# Patient Record
Sex: Female | Born: 1985 | Hispanic: Yes | Marital: Married | Smoking: Never smoker
Health system: Southern US, Community
[De-identification: ages and names within clinical notes are randomized; demographics above are authoritative.]

## PROBLEM LIST (undated history)

## (undated) DIAGNOSIS — Z789 Other specified health status: Secondary | ICD-10-CM

## (undated) DIAGNOSIS — R519 Headache, unspecified: Secondary | ICD-10-CM

## (undated) DIAGNOSIS — E663 Overweight: Secondary | ICD-10-CM

## (undated) DIAGNOSIS — E079 Disorder of thyroid, unspecified: Secondary | ICD-10-CM

## (undated) HISTORY — DX: Overweight: E66.3

## (undated) HISTORY — DX: Disorder of thyroid, unspecified: E07.9

## (undated) HISTORY — PX: NO PAST SURGERIES: SHX2092

---

## 2014-08-27 ENCOUNTER — Ambulatory Visit (INDEPENDENT_AMBULATORY_CARE_PROVIDER_SITE_OTHER): Payer: Self-pay | Admitting: Internal Medicine

## 2014-08-27 ENCOUNTER — Encounter: Payer: Self-pay | Admitting: Internal Medicine

## 2014-08-27 VITALS — BP 108/66 | HR 87 | Temp 98.5°F | Resp 12 | Ht 67.5 in | Wt 154.0 lb

## 2014-08-27 DIAGNOSIS — E0580 Other thyrotoxicosis without thyrotoxic crisis or storm: Secondary | ICD-10-CM | POA: Insufficient documentation

## 2014-08-27 DIAGNOSIS — E0590 Thyrotoxicosis, unspecified without thyrotoxic crisis or storm: Secondary | ICD-10-CM

## 2014-08-27 LAB — CBC
HCT: 36.4 % (ref 36.0–46.0)
Hemoglobin: 11.7 g/dL — ABNORMAL LOW (ref 12.0–15.0)
MCHC: 32.2 g/dL (ref 30.0–36.0)
MCV: 84.9 fl (ref 78.0–100.0)
Platelets: 191 10*3/uL (ref 150.0–400.0)
RBC: 4.29 Mil/uL (ref 3.87–5.11)
RDW: 16 % — ABNORMAL HIGH (ref 11.5–15.5)
WBC: 4.6 10*3/uL (ref 4.0–10.5)

## 2014-08-27 LAB — HEPATIC FUNCTION PANEL
ALT: 19 U/L (ref 0–35)
AST: 23 U/L (ref 0–37)
Albumin: 3.2 g/dL — ABNORMAL LOW (ref 3.5–5.2)
Alkaline Phosphatase: 100 U/L (ref 39–117)
BILIRUBIN TOTAL: 0.2 mg/dL (ref 0.2–1.2)
Bilirubin, Direct: 0 mg/dL (ref 0.0–0.3)
Total Protein: 6.1 g/dL (ref 6.0–8.3)

## 2014-08-27 LAB — TSH: TSH: 0.12 u[IU]/mL — AB (ref 0.35–4.50)

## 2014-08-27 LAB — T4, FREE: Free T4: 0.86 ng/dL (ref 0.60–1.60)

## 2014-08-27 LAB — T3, FREE: T3, Free: 5.1 pg/mL — ABNORMAL HIGH (ref 2.3–4.2)

## 2014-08-27 NOTE — Patient Instructions (Signed)
Please continue the Methimazole 5 mg 2x a day for now.  Please stop the Methimazole (Tapazole) and call us or your primary care doctor if you develop: - sore throat - fever - yellow skin - dark urine - light colored stools As we will then need to check your blood counts and liver tests.  Please stop at the lab.  Please come back for a follow-up appointment in 3 months  Hipertiroidismo (Hyperthyroidism) La tiroides es una glndula grande ubicada en la parte anterior e inferior del cuello. La tiroides interviene [employer] control del metabolismo. El metabolismo es el modo en que el organismo utiliza los alimentos. El control del metabolismo se realiza a travs de una hormona denominada tiroxina. Cuando la tiroides es hiperactiva, produce demasiada hormona. Cuando esto ocurre pueden surgir los siguientes problemas:   Nerviosismo  Intolerancia al calor  Prdida de peso (a pesar del aumento de la ingesta de comida)  Diarrea  Cambios en la textura del cabello o de la piel  Palpitaciones (falta de algunos latidos cardacos o latidos extra)  Taquicardia (frecuencia cardaca acelerada)  Falta de menstruacin (amenorrea)  Temblor en las manos CAUSAS  Enfermedad de Graves (el sistema inmune ataca a la glndula tiroides). sta es la causa ms frecuente.  Inflamacin de la glndula tiroides.  Tumor (generalmente benigno) de la glndula tiroides o Dispensing optician.  Uso excesivo de medicamentos para la tiroides (tanto prescriptos como 'naturales')  Ingesta excesiva de iodo. DIAGNSTICO Para confirmar el hipertiroidismo, el profesional que lo asiste le solicitar anlisis de sangre y estudios con [place]. Algunas veces los signos estn ocultos. Puede ser [employer] profesional controle la enfermedad con exmenes de [place], ya sea antes o despus del diagnstico y [place]. TRATAMIENTO Tratamiento a Estate agent varios tratamientos para Emergency planning/management officer.  Los medicamentos beta bloqueantes podrn proporcionar cierto alivio. Los medicamentos que disminuyen la produccin de hormonas proporcionarn a Emergency planning/management officer temporal Estas medidas en general no ofrecen Environmental education officer. Tratamiento definitivo  Se dispone de varios tratamientos que el profesional que lo asiste podr Teacher, English as a foreign language con usted y que tratarn el problema de [place]. Estos tratamientos varan desde la ciruga (extirpacin de la tiroides) o el uso de yodo radiactivo (que destruye la tiroides por radiacin), hasta el uso de medicamentos antitiroideos (que interfieren en la sntesis de la hormona). Los dos primeros tratamientos son permanentes y Psychologist, clinical. Habitualmente requieren el suministro de hormona de por vida. Esto es debido a que es imposible retirar o destruir la cantidad Engineer, petroleum de tiroides que se necesita para que la persona quede eutiroidea (normal). INSTRUCCIONES PARA EL CUIDADO DOMICILIARIO Consulte con el profesional que lo asiste si el problema por el que lo trata empeora. Algunos ejemplos seran los trastornos ya mencionados. SOLICITE ATENCIN MDICA SI: El trastorno general empeora. EST SEGURO QUE:   Comprende las instrucciones para el alta mdica.  Controlar su enfermedad.  Solicitar atencin mdica de inmediato segn las indicaciones. Document Released: 09/03/2005 Document Revised: 11/26/2011 [facility] Patient Information 2015 [place]. This information is not intended to replace advice given to you by your health care provider. Make sure you discuss any questions you have with your health care provider.

## 2014-08-27 NOTE — Progress Notes (Signed)
Patient ID: Sara Mathis, female   DOB: 07/05/1986, 28 y.o.   MRN: [id number]   HPI  Sara Mathis is a 28 y.o.-year-old female, referred by her PCP, Dr. 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  She does not have insurance.  She was dx with hyperthyroidism in 05/2014, when she presented to PCP for: - + HAs - + excessive sweating/heat intolerance - + weight loss 11 lbs in 3 weeks >> now started to gain it back - + tremors - + anxiety - + palpitations - + fatigue - no hyperdefecation  She was started on MMI 5 mg bid and Lopressor 25 mg 2 mo ago. In the last 2 mo >> all sxs above resolved.  I reviewed pt's thyroid tests: 07/02/2014: TSH <0.008   Reviewed labs per records from PCP: 06/2014: WBC 1.9 (4-10.5), Granulocytes 30% (43-77) 06/2014: AST 40 (0-37)  Pt denies feeling nodules in neck, hoarseness, dysphagia/odynophagia, SOB with lying down.  Pt does not have a FH of thyroid ds. No FH of thyroid cancer. No h/o radiation tx to head or neck.  No seaweed or kelp, no recent contrast studies. No steroid use. No herbal supplements.   ROS: Constitutional: + see HPI Eyes: no blurry vision, no xerophthalmia ENT: no sore throat, no nodules palpated in throat, no dysphagia/odynophagia, no hoarseness Cardiovascular: no CP/SOB/palpitations/leg swelling Respiratory: no cough/SOB Gastrointestinal: no N/V/D/C Musculoskeletal: no muscle/joint aches Skin: no rashes Neurological: no tremors/numbness/tingling/dizziness Psychiatric: no depression/anxiety  No past medical history except per HPI.  No past surgical history.  History   Social History  . Marital Status: Single    Spouse Name: N/A    Number of Children: 0   Occupational History  . cosmetologist   Social History Main Topics  . Smoking status: Never Smoker   . Smokeless tobacco: No  . Alcohol Use: Wine, 1x a mo  . Drug Use: Not on file   Current Outpatient Rx   Name  Route  Sig  Dispense  Refill  . B Complex-Folic Acid (B COMPLEX-VITAMIN B12 PO)   Oral   Take 1 capsule by mouth daily.         . methimazole (TAPAZOLE) 5 MG tablet       5 mg 2x a day     0   . metoprolol tartrate (LOPRESSOR) 25 MG tablet            11    NKDA  No family history other than DM in mother and GM  PE: BP 108/66 mmHg  Pulse 87  Temp(Src) 98.5 F (36.9 C) (Oral)  Resp 12  Ht 5' 7.5" (1.715 m)  Wt 154 lb (69.854 kg)  BMI 23.75 kg/m2  SpO2 99% Wt Readings from Last 3 Encounters:  08/27/14 154 lb (69.854 kg)   Constitutional: normal weight, in NAD Eyes: PERRLA, EOMI, no exophthalmos, no lid lag, no stare ENT: moist mucous membranes, + symmetric thyromegaly, no thyroid bruits, no cervical lymphadenopathy Cardiovascular: RRR, No MRG Respiratory: CTA B Gastrointestinal: abdomen soft, NT, ND, BS+ Musculoskeletal: no deformities, strength intact in all 4 Skin: moist, warm, no rashes Neurological: mild tremor with outstretched hands, DTR normal in all 4  ASSESSMENT: 1. Thyrotoxicosis  PLAN:  1. Patient with a recently found low TSH, with thyrotoxic sxs: weight loss, heat intolerance, palpitations, tremors, anxiety, weight loss. Now on MMI per PCP, with resolution of sxs, but tx is limited by her low WBC count and slightly high  transaminases. - she does not appear to have exogenous causes for the low TSH.  - We discussed that possible causes of thyrotoxicosis are:  Graves ds  (most likely) Thyroiditis toxic multinodular goiter/ toxic adenoma (I cannot feel nodules at palpation of her thyroid). - I suggested that we check the TSH, fT3 and fT4 and also had thyroid stimulating antibodies to screen for Graves' disease.  - I discussed with the pt (through the interpreter) that ideally we would need an uptake and scan to differentiate between the 3 above possible etiologies, however, since pt does not have insurance, we would like to avoid costly tests >>  I will check TSI antibodies and if positive, will assume Graves ds. Pt agrees with this. - we discussed about possible modalities of treatment for the above conditions, to include methimazole use, radioactive iodine ablation or surgery. - The best way to treat her now (since no insurance, would be MMI. I will check WBC and LFTs again >> if abnormal, unfortunately, we will need to go with the RAI tx. If normal, will continue MMI tx since, per clinical signs and sxs, this is working for her -continue beta blocker (Lopressor 25 mg daily) >> may need to stop at next visit - I advised her to join my chart to communicate easier - Return in about 3 months (around 11/26/2014).  Office Visit on 08/27/2014  Component Date Value Ref Range Status  . WBC 08/27/2014 4.6  4.0 - 10.5 K/uL Final  . RBC 08/27/2014 4.29  3.87 - 5.11 Mil/uL Final  . Platelets 08/27/2014 191.0  150.0 - 400.0 K/uL Final  . Hemoglobin 08/27/2014 11.7* 12.0 - 15.0 g/dL Final  . HCT 08/27/2014 36.4  36.0 - 46.0 % Final  . MCV 08/27/2014 84.9  78.0 - 100.0 fl Final  . MCHC 08/27/2014 32.2  30.0 - 36.0 g/dL Final  . RDW 08/27/2014 16.0* 11.5 - 15.5 % Final  . TSH 08/27/2014 0.12* 0.35 - 4.50 uIU/mL Final  . Free T4 08/27/2014 0.86  0.60 - 1.60 ng/dL Final  . T3, Free 08/27/2014 5.1* 2.3 - 4.2 pg/mL Final  . TSI 08/27/2014 174* <140 % baseline Final   Comment: Thyroid stimulating immunoglobulins (TSI) can engage the TSH receptors resulting in hyperthyroidism in Graves' disease patients. TSI levels can be useful in monitoring the clinical outcome of Graves' disease as well as assessing the potential for hyperthyroidism from maternal-fetal transfer. TSI results greater than or equal to (>=) 140% of the Reference Control are considered positive. NOTE: A serum TSH level greater than 350 micro-International Units/mL can interfere with the TSI bioassay and potentially give false positive results. Patients who are pregnant and are  suspected of having hyperthyroidism should have both TSI and human Chorionic Gonadotropin(hCG) tests measured. A serum hCG level greater than 40,625 mIU/mL can interfere with the TSI bioassay and may give false negative results. In these patients it is recommended that a second TSI be obtained when the hCG concentration falls below 40,625 mIU/mL (usually after approximately 20-weeks gestation). The an                          alytical performance characteristics of this assay have been determined by [employer], [place], [place]. The modifications have not been cleared or approved by the FDA. This assay has been validated pursuant to the CLIA regulations and is used for clinical purposes.   . Total Bilirubin 08/27/2014 0.2  0.2 - 1.2  mg/dL Final  . Bilirubin, Direct 08/27/2014 0.0  0.0 - 0.3 mg/dL Final  . Alkaline Phosphatase 08/27/2014 100  39 - 117 U/L Final  . AST 08/27/2014 23  0 - 37 U/L Final  . ALT 08/27/2014 19  0 - 35 U/L Final  . Total Protein 08/27/2014 6.1  6.0 - 8.3 g/dL Final  . Albumin 08/27/2014 3.2* 3.5 - 5.2 g/dL Final   TFTs much improved! WBC and transaminases normal >> can continue MMI 5 mg bid. Will need repeat TFTs in 5 weeks. TSI high >> likely Graves ds.

## 2014-09-01 LAB — THYROID STIMULATING IMMUNOGLOBULIN: TSI: 174 % baseline — ABNORMAL HIGH (ref <140)

## 2014-09-02 ENCOUNTER — Encounter: Payer: Self-pay | Admitting: Internal Medicine

## 2014-09-07 ENCOUNTER — Encounter: Payer: Self-pay | Admitting: *Deleted

## 2014-09-17 DIAGNOSIS — E079 Disorder of thyroid, unspecified: Secondary | ICD-10-CM | POA: Insufficient documentation

## 2014-09-20 ENCOUNTER — Encounter: Payer: Self-pay | Admitting: Internal Medicine

## 2014-11-26 ENCOUNTER — Ambulatory Visit: Payer: Self-pay | Admitting: Internal Medicine

## 2014-12-26 ENCOUNTER — Emergency Department (HOSPITAL_COMMUNITY)
Admission: EM | Admit: 2014-12-26 | Discharge: 2014-12-26 | Disposition: A | Discharge status: Home / Self Care | Payer: Self-pay | Attending: Emergency Medicine | Admitting: Emergency Medicine

## 2014-12-26 ENCOUNTER — Encounter (HOSPITAL_COMMUNITY): Payer: Self-pay | Admitting: Emergency Medicine

## 2014-12-26 ENCOUNTER — Emergency Department (HOSPITAL_COMMUNITY): Admission: EM | Admit: 2014-12-26 | Discharge: 2014-12-26 | Discharge status: Absent without leave | Payer: Self-pay

## 2014-12-26 DIAGNOSIS — Z79899 Other long term (current) drug therapy: Secondary | ICD-10-CM | POA: Insufficient documentation

## 2014-12-26 DIAGNOSIS — Z8639 Personal history of other endocrine, nutritional and metabolic disease: Secondary | ICD-10-CM | POA: Insufficient documentation

## 2014-12-26 DIAGNOSIS — S0083XA Contusion of other part of head, initial encounter: Secondary | ICD-10-CM | POA: Insufficient documentation

## 2014-12-26 DIAGNOSIS — Y9289 Other specified places as the place of occurrence of the external cause: Secondary | ICD-10-CM | POA: Insufficient documentation

## 2014-12-26 DIAGNOSIS — S161XXA Strain of muscle, fascia and tendon at neck level, initial encounter: Secondary | ICD-10-CM | POA: Insufficient documentation

## 2014-12-26 DIAGNOSIS — Y9389 Activity, other specified: Secondary | ICD-10-CM | POA: Insufficient documentation

## 2014-12-26 DIAGNOSIS — T148XXA Other injury of unspecified body region, initial encounter: Secondary | ICD-10-CM

## 2014-12-26 DIAGNOSIS — Y998 Other external cause status: Secondary | ICD-10-CM | POA: Insufficient documentation

## 2014-12-26 MED ORDER — IBUPROFEN 800 MG PO TABS
800.0000 mg | ORAL_TABLET | Freq: Once | ORAL | Status: AC
  Administered 2014-12-26: 800 mg via ORAL
  Filled 2014-12-26: qty 1

## 2014-12-26 MED ORDER — IBUPROFEN 800 MG PO TABS: 800.0000 mg | ORAL_TABLET | Freq: Three times a day (TID) | ORAL | Status: DC
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
## 2014-12-26 NOTE — Discharge Instructions (Signed)
Contusin facial o en el cuero cabelludo ( Facial or Scalp Contusion) Se llama contusin facial o en el cuero cabelludo cuando se recibe un fuerte golpe en la cara o la cabeza. Las lesiones de la cara y la cabeza generalmente causan una gran hinchazn, especialmente alrededor de los ojos. Las contusiones son el resultado de una lesin que causa sangrado debajo de la piel. La zona de la contusin puede ponerse [place], [place] o [place]. Las lesiones menores causarn contusiones sin Social research officer, government, Armed forces training and education officer las ms graves pueden presentar dolor e inflamacin durante un par de semanas.  CAUSAS  La causa de una contusin facial o en el cuero cabelludo suele ser un traumatismo o lesin contundente en la zona del rostro o la cabeza.  SIGNOS Y SNTOMAS   Hinchazn de la zona lesionada.  Cambio de color de la zona lesionada.  Sensibilidad o dolor en la zona lesionada. DIAGNSTICO  Se puede establecer un diagnstico al hacer una historia clnica y un examen fsico. Podra ser necesario tomar una radiografa, tomografa computarizada (TC) o una resonancia magntica (RM) para determinar si hubo lesiones asociadas, como por ejemplo huesos rotos (fracturas). TRATAMIENTO  Frecuentemente, el mejor tratamiento para una contusin facial o del cuero cabelludo es la aplicacin de compresas fras en la zona lesionada. Para calmar el dolor tambin podrn recomendarle medicamentos de venta libre.  [place] solo medicamentos de venta libre o recetados, segn las indicaciones del mdico.  Aplique hielo sobre la zona lesionada.  Ponga el hielo en una bolsa plstica.  Coloque una toalla entre la piel y la bolsa de hielo.  Deje el hielo durante 20 minutos, 2 a 3 veces por da. SOLICITE ATENCIN MDICA SI:  Tiene dificultad para morder.  Siente dolor al [employer].  Est preocupado por las imperfecciones que pueden quedar en la cara. SOLICITE ATENCIN MDICA DE INMEDIATO SI:  Siente  algn dolor intenso o le duele la cabeza y no se calma con los medicamentos.  Observa cambios en la personalidad, somnolencia o confusin que no son habituales.  Vomita.  Tiene una hemorragia nasal persistente.  Tiene visin doble o visin borrosa.  Supura lquido por la nariz o el odo.  Tiene dificultad para caminar o para usar [employer] piernas. ASEGRESE DE QUE:   Comprende estas instrucciones.  Controlar su afeccin.  Recibir ayuda de inmediato si no mejora o si empeora. Document Released: 09/03/2005 Document Revised: 06/24/2013 [facility] Patient Information 2015 [place], [place]. This information is not intended to replace advice given to you by your health care provider. Make sure you discuss any questions you have with your health care provider.
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
## 2014-12-31 ENCOUNTER — Ambulatory Visit: Payer: Self-pay | Admitting: Internal Medicine

## 2015-02-21 DIAGNOSIS — E079 Disorder of thyroid, unspecified: Secondary | ICD-10-CM

## 2015-02-21 HISTORY — DX: Disorder of thyroid, unspecified: E07.9

## 2015-02-21 HISTORY — PX: THYROIDECTOMY: SHX17

## 2015-05-07 DIAGNOSIS — D709 Neutropenia, unspecified: Secondary | ICD-10-CM | POA: Insufficient documentation

## 2015-05-07 DIAGNOSIS — E0590 Thyrotoxicosis, unspecified without thyrotoxic crisis or storm: Secondary | ICD-10-CM

## 2015-06-29 ENCOUNTER — Ambulatory Visit: Payer: Self-pay | Admitting: Internal Medicine

## 2015-11-18 ENCOUNTER — Ambulatory Visit (INDEPENDENT_AMBULATORY_CARE_PROVIDER_SITE_OTHER): Payer: Self-pay | Admitting: Internal Medicine

## 2015-11-18 ENCOUNTER — Encounter: Payer: Self-pay | Admitting: Internal Medicine

## 2015-11-18 VITALS — BP 112/70 | HR 82 | Ht 67.5 in | Wt 166.0 lb

## 2015-11-18 DIAGNOSIS — Z23 Encounter for immunization: Secondary | ICD-10-CM

## 2015-11-18 DIAGNOSIS — J302 Other seasonal allergic rhinitis: Secondary | ICD-10-CM

## 2015-11-18 DIAGNOSIS — E890 Postprocedural hypothyroidism: Secondary | ICD-10-CM

## 2015-11-18 MED ORDER — FEXOFENADINE HCL 180 MG PO TABS: 180.0000 mg | ORAL_TABLET | Freq: Every day | ORAL | Status: DC

## 2015-11-18 NOTE — Progress Notes (Signed)
   Subjective:    Patient ID: Sara Mathis, female    DOB: [birth year], 30 y.o.   MRN: [id number]  HPI   1.  Pain in throat and chest and worried it may have something to do with her thyroid.   Started 1 week ago.  Points to retroauricular and follows along Sternocleidomastoid muscle.  Is a bit sore to touch.  Does not hurt to turn her head.  Throat is sore inside as well.  Feels the need to cough and voice is hoarse.  Throat can be scratchy.  Eyes and nose are itchy and runny at times.  Sneezes now and then. No fever Last week had some nausea and vomiting with diarrhea for one day.  But, admits that happens sometimes anyway. Has not tried otc medication.  2. Hypothyroidism S/P thyroidectomy for Grave's Disease and hyperthyroidism:  Misses her Levothyroxine 1 day out of a week.  Missed her follow up to recheck TSH.    Review of Systems     Objective:   Physical Exam NAD Mildly hoarse HEENT:  PERRL, EOMI, conjunctivae without injection, TMs pearly gray, No posterior or tonsillar erythema Tender over maxillary sinuses. Neck:  Supple, No adenopathy.  Tender along SCM muscles bilaterally-mild. No thyroid area masses or swelling Chest:  CTA CV:  RRR without murmur or rub, radial pulses normal and equal.   Neuro: No tremor        Assessment & Plan:  1.  Seasonal allergies vs.  Viral URI.  Fexofenadine 180 mg daily.  Push fluids.  2.  Surgical Hypothyroidism:  TSH.

## 2015-11-19 ENCOUNTER — Encounter: Payer: Self-pay | Admitting: Internal Medicine

## 2015-11-19 DIAGNOSIS — E890 Postprocedural hypothyroidism: Secondary | ICD-10-CM | POA: Insufficient documentation

## 2015-11-19 LAB — TSH: TSH: 31.34 u[IU]/mL — ABNORMAL HIGH (ref 0.450–4.500)

## 2015-11-20 MED ORDER — LEVOTHYROXINE SODIUM 125 MCG PO TABS
125.0000 ug | ORAL_TABLET | Freq: Every day | ORAL | Status: DC
Start: 2015-11-20 — End: 2016-12-13

## 2015-11-20 NOTE — Addendum Note (Signed)
Addended by: Marcelino Duster on: 11/20/2015 08:38 AM   Modules accepted: Orders, Medications

## 2016-01-06 ENCOUNTER — Other Ambulatory Visit: Payer: Self-pay

## 2016-01-13 ENCOUNTER — Ambulatory Visit: Payer: Self-pay | Admitting: Internal Medicine

## 2016-01-13 ENCOUNTER — Other Ambulatory Visit: Payer: Self-pay

## 2016-01-14 LAB — TSH: TSH: 0.413 u[IU]/mL — ABNORMAL LOW (ref 0.450–4.500)

## 2016-01-20 ENCOUNTER — Ambulatory Visit: Payer: Self-pay | Admitting: Internal Medicine

## 2016-02-17 ENCOUNTER — Ambulatory Visit: Payer: Self-pay | Admitting: Internal Medicine

## 2016-02-29 ENCOUNTER — Telehealth: Payer: Self-pay | Admitting: Internal Medicine

## 2016-02-29 NOTE — Telephone Encounter (Signed)
Patient called to see if Dr. Amil Amen can write a detailed  letter stating patient's  medical problems, medications, and how long patient has been established with [facility].

## 2016-03-01 NOTE — Telephone Encounter (Signed)
Please need same info regarding what this is about so I can give info that makes sense.

## 2016-03-01 NOTE — Telephone Encounter (Signed)
Left voice message to return call 

## 2016-03-01 NOTE — Telephone Encounter (Signed)
No answer from patient. Left message to call the office. Need to know why this letter is needed. Thanks

## 2016-03-02 NOTE — Telephone Encounter (Signed)
Lawyer name is Birdena Crandall Address: [address], [place], [place], [place] [address] Telephone: [phone] Email: [email or web address]  Patient is trying to get [employer] and states the letter needs to have details about her health problems, surgeries, and medications.

## 2016-03-05 ENCOUNTER — Encounter: Payer: Self-pay | Admitting: Internal Medicine

## 2016-03-05 NOTE — Telephone Encounter (Signed)
Please send correspondence to her attorney and notify patient.

## 2016-03-07 NOTE — Telephone Encounter (Signed)
Letter mailed.  Patient informed

## 2016-04-20 ENCOUNTER — Other Ambulatory Visit (INDEPENDENT_AMBULATORY_CARE_PROVIDER_SITE_OTHER): Payer: Self-pay

## 2016-04-20 DIAGNOSIS — E890 Postprocedural hypothyroidism: Secondary | ICD-10-CM

## 2016-04-21 LAB — TSH: TSH: 3.77 u[IU]/mL (ref 0.450–4.500)

## 2016-04-21 NOTE — Progress Notes (Signed)
Notify her TSH was ok.  Stay on the current dose of Levothyroxine, which I have as 125 mcg daily

## 2016-12-13 ENCOUNTER — Telehealth: Payer: Self-pay

## 2016-12-13 DIAGNOSIS — E890 Postprocedural hypothyroidism: Secondary | ICD-10-CM

## 2016-12-13 MED ORDER — LEVOTHYROXINE SODIUM 125 MCG PO TABS: 125.0000 ug | ORAL_TABLET | Freq: Every day | ORAL | 1 refills | Status: DC

## 2016-12-13 NOTE — Telephone Encounter (Signed)
To estefania to notify patient. Rx has been sent to the pharmacy.

## 2016-12-13 NOTE — Telephone Encounter (Signed)
Patient called requesting a refill on Levothyroxine. Patient has not been seen since 11/18/15. To Dr. Amil Amen for further direction.

## 2016-12-13 NOTE — Telephone Encounter (Signed)
Per Dr. Amil Amen she will fill for 2 month but patient needs to schedule appointment. To [name] to notify patient.

## 2016-12-18 NOTE — Telephone Encounter (Signed)
Patient informed about Rx. Pt. Currently working in [place] will call to set up follow up appointment once she returns

## 2017-03-05 ENCOUNTER — Other Ambulatory Visit: Payer: Self-pay | Admitting: Internal Medicine

## 2017-03-05 DIAGNOSIS — E890 Postprocedural hypothyroidism: Secondary | ICD-10-CM

## 2017-06-10 ENCOUNTER — Encounter: Payer: Self-pay | Admitting: Internal Medicine

## 2017-06-10 ENCOUNTER — Ambulatory Visit (INDEPENDENT_AMBULATORY_CARE_PROVIDER_SITE_OTHER): Payer: Self-pay | Admitting: Internal Medicine

## 2017-06-10 VITALS — BP 130/88 | HR 70 | Resp 12 | Ht 67.5 in | Wt 176.0 lb

## 2017-06-10 DIAGNOSIS — Z23 Encounter for immunization: Secondary | ICD-10-CM

## 2017-06-10 DIAGNOSIS — E890 Postprocedural hypothyroidism: Secondary | ICD-10-CM

## 2017-06-10 DIAGNOSIS — R252 Cramp and spasm: Secondary | ICD-10-CM

## 2017-06-10 DIAGNOSIS — E663 Overweight: Secondary | ICD-10-CM

## 2017-06-10 HISTORY — DX: Overweight: E66.3

## 2017-06-10 MED ORDER — LEVOTHYROXINE SODIUM 125 MCG PO TABS: 125.0000 ug | ORAL_TABLET | Freq: Every day | ORAL | 3 refills | Status: DC

## 2017-06-10 NOTE — Patient Instructions (Signed)

## 2017-06-10 NOTE — Progress Notes (Signed)
   Subjective:    Patient ID: Sara Mathis, female    DOB: Oct 17, 1985, 31 y.o.   MRN: [id number]  HPI   1.  Overweight:  Discussed has gradually gained weight since 2015 and is now in overweight range.  2.  Surgical hypothyroidism:  History of hyperthyroidism.  States she does not take Levothyroxine when she has period (3 days monthly).  3.  Calf cramps at night:  Not drinking lots of water.  Not taking Vitamin B complex.  Has not tried bar of soap at foot of bed.   [place]   Current Meds  Medication Sig  . levothyroxine (SYNTHROID, LEVOTHROID) 125 MCG tablet Take 1 tablet (125 mcg total) by mouth daily before breakfast.    No Known Allergies    Review of Systems     Objective:   Physical Exam   NAD HEENT:  PERRL, EOMI,  Neck:  Supple, No adenopathy, no thyroid area mass Lungs:  CTA CV:  RRR without murmur or rub, radial and DP pulses normal and equal ABd:  S, NT, No HSM or mass, + BS LE:  No edema, no erythema, NT         Assessment & Plan:  1.  Overweight:  Long discussion on entire family working on lifestyle changes together.  Dietary and physical activity information discussed.  2.  Surgical Hypothyroidism:  TSH.  Continue Levothyroxine 125 mcg daily.  No missing--to take 1/2 hour before breakfast daily.  3.  Nocturnal leg cramps:  Increase water intake.  Increase fruits and veggies.  B complex 1 tab 3 times daily.  4.  HM:  Needs Tdap.  CPE with pap in 4-6 months.  Reportedly has never had a pap.

## 2017-06-11 LAB — BASIC METABOLIC PANEL
BUN / CREAT RATIO: 26 — AB (ref 9–23)
BUN: 18 mg/dL (ref 6–20)
CALCIUM: 9 mg/dL (ref 8.7–10.2)
CO2: 23 mmol/L (ref 20–29)
Chloride: 103 mmol/L (ref 96–106)
Creatinine, Ser: 0.68 mg/dL (ref 0.57–1.00)
GFR, EST AFRICAN AMERICAN: 134 mL/min/{1.73_m2} (ref >59)
GFR, EST NON AFRICAN AMERICAN: 116 mL/min/{1.73_m2} (ref >59)
Glucose: 70 mg/dL (ref 65–99)
POTASSIUM: 4.3 mmol/L (ref 3.5–5.2)
SODIUM: 139 mmol/L (ref 134–144)

## 2017-06-11 LAB — TSH: TSH: 3.17 u[IU]/mL (ref 0.450–4.500)

## 2017-10-11 ENCOUNTER — Telehealth: Payer: Self-pay | Admitting: Internal Medicine

## 2017-10-11 NOTE — Telephone Encounter (Signed)
Patient is requesting refill for levothyroxine (SYNTHROID, LEVOTHROID) 125 MCG tablet  To be sent to preferred pharmacy in [place]. Please notified patient once is sent Pt. Had CPE on 10/14/17 but rescheduled for 11/06/17 at 11AM

## 2017-10-14 ENCOUNTER — Ambulatory Visit: Payer: Self-pay | Admitting: Internal Medicine

## 2017-10-14 ENCOUNTER — Other Ambulatory Visit: Payer: Self-pay

## 2017-10-14 DIAGNOSIS — E890 Postprocedural hypothyroidism: Secondary | ICD-10-CM

## 2017-10-14 MED ORDER — LEVOTHYROXINE SODIUM 125 MCG PO TABS
125.0000 ug | ORAL_TABLET | Freq: Every day | ORAL | 1 refills | Status: DC
Start: 2017-10-14 — End: 2017-11-06

## 2017-10-14 NOTE — Telephone Encounter (Signed)
Rx sent to pharmacy   

## 2017-11-06 ENCOUNTER — Encounter: Payer: Self-pay | Admitting: Internal Medicine

## 2017-11-06 ENCOUNTER — Ambulatory Visit: Payer: Self-pay | Admitting: Internal Medicine

## 2017-11-06 VITALS — BP 120/70 | HR 84 | Resp 12 | Ht 67.5 in | Wt 180.0 lb

## 2017-11-06 DIAGNOSIS — E890 Postprocedural hypothyroidism: Secondary | ICD-10-CM

## 2017-11-06 DIAGNOSIS — E663 Overweight: Secondary | ICD-10-CM

## 2017-11-06 DIAGNOSIS — Z124 Encounter for screening for malignant neoplasm of cervix: Secondary | ICD-10-CM

## 2017-11-06 DIAGNOSIS — E079 Disorder of thyroid, unspecified: Secondary | ICD-10-CM

## 2017-11-06 DIAGNOSIS — Z0000 Encounter for general adult medical examination without abnormal findings: Secondary | ICD-10-CM

## 2017-11-06 LAB — POCT WET PREP WITH KOH
KOH PREP POC: NEGATIVE
RBC Wet Prep HPF POC: NEGATIVE
Trichomonas, UA: NEGATIVE
YEAST WET PREP PER HPF POC: NEGATIVE

## 2017-11-06 MED ORDER — LEVOTHYROXINE SODIUM 125 MCG PO TABS: 125.0000 ug | ORAL_TABLET | Freq: Every day | ORAL | 11 refills | Status: DC

## 2017-11-06 NOTE — Progress Notes (Signed)
Subjective:    Patient ID: Sara Mathis, female    DOB: 07-14-1986, 32 y.o.   MRN: [id number]  HPI   CPE with pap  1.  Pap:  Never.  No family history of cervical cancer.  2.  Mammogram:  Never.  No family history of breast cancer.  3.  Osteoprevention:  Little in way dairy.  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  No family history of osteoporosis or fractures.  4.  Guaiac Cards: Never.  No family history of colon cancer.  5.  Colonoscopy:  Never.  6.  Immunizations:  No influenza vaccine this fall. Immunization History  Administered Date(s) Administered  . Influenza,inj,Quad PF,6+ Mos 11/18/2015  . Tdap 06/10/2017    7.  Glucose/Cholesterol:  Blood glucose has always been normal.  Has never had cholesterol checked.   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  Later, states sometimes tired. Has gained a bit of weight.  Dry skin.  + constipation.  Medications Discontinued During This Encounter  Medication Reason  . levothyroxine (SYNTHROID, LEVOTHROID) 125 MCG tablet Reorder   No Known Allergies   Past Medical History:  Diagnosis Date  . Overweight (BMI 25.0-29.9) 06/10/2017  . Thyroid disease 02/21/2015   Grave's disease resulting in total thyroidectomy    Past Surgical History:  Procedure Laterality Date  . THYROIDECTOMY Bilateral 02/21/2015   For Grave's Disease not well controlled on medication    Family History  Problem Relation Age of Onset  . Diabetes Mother   . Fibromyalgia Mother   . Heart disease Mother        age 33 and 81  . Diabetes Father   . ADD / ADHD Brother        Review of Systems  HENT: Positive for dental problem (does not have orange card). Negative for hearing loss.   Respiratory: Negative for cough and shortness of breath.   Cardiovascular: Negative for chest pain.  Gastrointestinal: Negative for abdominal pain.       Objective:   Physical  Exam  Constitutional: She is oriented to person, place, and time. She appears well-developed and well-nourished.  HENT:  Head: Normocephalic and atraumatic.  Right Ear: Hearing, tympanic membrane, external ear and ear canal normal.  Left Ear: Hearing, tympanic membrane, external ear and ear canal normal.  Nose: Nose normal.  Mouth/Throat: Oropharynx is clear and moist and mucous membranes are normal.  Points to upper right molar as source of pain.  Unable to see any abnormality, though posterior tooth hard to see.  Eyes: Conjunctivae and EOM are normal. Pupils are equal, round, and reactive to light.  Discs sharp bilaterally  Neck: Normal range of motion and full passive range of motion without pain. Neck supple. No thyroid mass and no thyromegaly present.  Cardiovascular: Normal rate, regular rhythm, S1 normal and S2 normal. Exam reveals no S3, no S4 and no friction rub.  No murmur heard. Carotid, radial, femoral, DP and PT pulses normal and equal  Pulmonary/Chest: Effort normal and breath sounds normal. Right breast exhibits no inverted nipple, no mass, no nipple discharge, no skin change and no tenderness. Left breast exhibits no inverted nipple, no mass, no nipple discharge, no skin change and no tenderness.  Abdominal: Soft. Bowel sounds are normal. She exhibits no mass. There is no hepatosplenomegaly. There is no tenderness. No hernia.  Genitourinary:  Genitourinary Comments: Normal external genitalia.  Scant mucousy cervical discharge.  No  cervical lesion.  No uterine or adnexal mass or tenderness.  Musculoskeletal: Normal range of motion.  Lymphadenopathy:       Head (right side): No submental and no submandibular adenopathy present.       Head (left side): No submental and no submandibular adenopathy present.    She has no cervical adenopathy.    She has no axillary adenopathy.       Right: No inguinal and no supraclavicular adenopathy present.       Left: No inguinal and no  supraclavicular adenopathy present.  Neurological: She is alert and oriented to person, place, and time. She has normal strength and normal reflexes. No cranial nerve deficit or sensory deficit. Coordination normal.  Skin: Skin is warm and dry.  Psychiatric: She has a normal mood and affect. Her behavior is normal. Judgment and thought content normal.    Wet prep:  + clue cells, but negative whiff, trich.  No fungal elements.      Assessment & Plan:  1.  CPE with pap CBC, CMP, TSH, FLP Encouraged yearly influenza vaccine. Encouraged Calcium 500 mg with 200 IU Vitamin D twice daily and weight bearing physical activity. Patient later admitted to eating lots of cheese daily, so may have adequate calcium and Vitamin D intake.  2.  Overweight:  Discussed health lifestyle, eating habits and increasing daily physical activity outside of work.  3.  Hypothyroidism:  TSH as above.  Continue Levothyroxine.

## 2017-11-06 NOTE — Patient Instructions (Signed)

## 2017-11-07 LAB — COMPREHENSIVE METABOLIC PANEL
A/G RATIO: 1.4 (ref 1.2–2.2)
ALK PHOS: 74 IU/L (ref 39–117)
ALT: 18 IU/L (ref 0–32)
AST: 21 IU/L (ref 0–40)
Albumin: 4.1 g/dL (ref 3.5–5.5)
BILIRUBIN TOTAL: 0.4 mg/dL (ref 0.0–1.2)
BUN/Creatinine Ratio: 14 (ref 9–23)
BUN: 10 mg/dL (ref 6–20)
CHLORIDE: 103 mmol/L (ref 96–106)
CO2: 23 mmol/L (ref 20–29)
Calcium: 9 mg/dL (ref 8.7–10.2)
Creatinine, Ser: 0.71 mg/dL (ref 0.57–1.00)
GFR calc Af Amer: 129 mL/min/{1.73_m2} (ref >59)
GFR calc non Af Amer: 112 mL/min/{1.73_m2} (ref >59)
Globulin, Total: 3 g/dL (ref 1.5–4.5)
Glucose: 73 mg/dL (ref 65–99)
POTASSIUM: 3.9 mmol/L (ref 3.5–5.2)
Sodium: 140 mmol/L (ref 134–144)
Total Protein: 7.1 g/dL (ref 6.0–8.5)

## 2017-11-07 LAB — LIPID PANEL W/O CHOL/HDL RATIO
Cholesterol, Total: 191 mg/dL (ref 100–199)
HDL: 61 mg/dL (ref >39)
LDL Calculated: 111 mg/dL — ABNORMAL HIGH (ref 0–99)
TRIGLYCERIDES: 95 mg/dL (ref 0–149)
VLDL Cholesterol Cal: 19 mg/dL (ref 5–40)

## 2017-11-07 LAB — CBC WITH DIFFERENTIAL/PLATELET
BASOS: 0 %
Basophils Absolute: 0 10*3/uL (ref 0.0–0.2)
EOS (ABSOLUTE): 0 10*3/uL (ref 0.0–0.4)
Eos: 1 %
Hematocrit: 40.8 % (ref 34.0–46.6)
Hemoglobin: 12.6 g/dL (ref 11.1–15.9)
IMMATURE GRANULOCYTES: 0 %
Immature Grans (Abs): 0 10*3/uL (ref 0.0–0.1)
Lymphocytes Absolute: 1.5 10*3/uL (ref 0.7–3.1)
Lymphs: 30 %
MCH: 29.2 pg (ref 26.6–33.0)
MCHC: 30.9 g/dL — ABNORMAL LOW (ref 31.5–35.7)
MCV: 95 fL (ref 79–97)
Monocytes Absolute: 0.4 10*3/uL (ref 0.1–0.9)
Monocytes: 9 %
NEUTROS PCT: 60 %
Neutrophils Absolute: 2.9 10*3/uL (ref 1.4–7.0)
PLATELETS: 229 10*3/uL (ref 150–379)
RBC: 4.31 x10E6/uL (ref 3.77–5.28)
RDW: 13.2 % (ref 12.3–15.4)
WBC: 4.8 10*3/uL (ref 3.4–10.8)

## 2017-11-07 LAB — TSH: TSH: 5.07 u[IU]/mL — ABNORMAL HIGH (ref 0.450–4.500)

## 2017-11-08 LAB — CYTOLOGY - PAP

## 2017-12-10 ENCOUNTER — Other Ambulatory Visit: Payer: Self-pay

## 2017-12-10 ENCOUNTER — Telehealth: Payer: Self-pay | Admitting: Internal Medicine

## 2017-12-10 DIAGNOSIS — E890 Postprocedural hypothyroidism: Secondary | ICD-10-CM

## 2017-12-10 MED ORDER — LEVOTHYROXINE SODIUM 125 MCG PO TABS: 125.0000 ug | ORAL_TABLET | Freq: Every day | ORAL | 11 refills | Status: DC

## 2017-12-10 NOTE — Telephone Encounter (Signed)
Rx sent to pharmacy   

## 2017-12-10 NOTE — Telephone Encounter (Signed)
Patient would like Rx transferred from [facility] to [facility] on [address] [place], [place]  [address].  [phone].  Rx to transfer levothyroxine (SYNTHROID,LEVOTHROId) 125 mcg tablet.  Patient contact [phone]

## 2018-01-06 ENCOUNTER — Other Ambulatory Visit: Payer: Self-pay

## 2018-03-28 ENCOUNTER — Ambulatory Visit: Payer: Self-pay | Admitting: Internal Medicine

## 2018-03-31 ENCOUNTER — Encounter: Payer: Self-pay | Admitting: Internal Medicine

## 2018-03-31 ENCOUNTER — Ambulatory Visit: Payer: Self-pay | Admitting: Internal Medicine

## 2018-03-31 VITALS — BP 102/70 | HR 80 | Resp 12 | Ht 67.5 in | Wt 181.0 lb

## 2018-03-31 DIAGNOSIS — R5383 Other fatigue: Secondary | ICD-10-CM

## 2018-03-31 DIAGNOSIS — E890 Postprocedural hypothyroidism: Secondary | ICD-10-CM

## 2018-03-31 NOTE — Progress Notes (Signed)
   Subjective:    Patient ID: Sara Mathis, female    DOB: September 01, 1986, 32 y.o.   MRN: [id number]  HPI   Fatigued, nervous.  Finds herself crying more.  3 weeks ago, felt palpitations.  Feels tremulous,  Has missed her levothyroxine maybe 5 days out of the month.  She was working different shifts and forgetting to take on some days. Felt she had to come home and just rest all the time. Skin is dry, hair dry.  No definite constipations. She is no longer working that job. She is not certain if the Levothyroxine she is taking is from a new manufacturer or not.   She waits 30-60 minutes after taking her levothyroxine daily to eat. She has felt this way before when missing her hormone   No suicidal ideation  Current Meds  Medication Sig  . levothyroxine (SYNTHROID, LEVOTHROID) 125 MCG tablet Take 1 tablet (125 mcg total) by mouth daily before breakfast.    No Known Allergies       Review of Systems     Objective:   Physical Exam NAD HEENT:  Possibly a bit of lid lag on the left.  PERRL, EOMI, No definite exophthalmos.   Neck:  Supple, No adenopathy, no palpable thyroid Chest:  CTA CV:  RRR without murmur or rub,  Radial and DP pulses normal and equal Abd:  S, NT, No HSM or mass, + BS LE:  No edema. Ankle reflexes 2+/4 No tremor with hands       Assessment & Plan:  1.  Fatigue:  Patient and wife concerned about depression, but also recognize could be from missing levothyroxine. CBC, CMP, TSH, free T4.  If no abnormal lab findings, will have her come in to readdress as depression causing symptoms.

## 2018-04-01 LAB — CBC WITH DIFFERENTIAL/PLATELET
BASOS ABS: 0 10*3/uL (ref 0.0–0.2)
Basos: 0 %
EOS (ABSOLUTE): 0.1 10*3/uL (ref 0.0–0.4)
Eos: 1 %
HEMOGLOBIN: 11.6 g/dL (ref 11.1–15.9)
Hematocrit: 37.9 % (ref 34.0–46.6)
IMMATURE GRANS (ABS): 0 10*3/uL (ref 0.0–0.1)
Immature Granulocytes: 0 %
LYMPHS: 41 %
Lymphocytes Absolute: 1.6 10*3/uL (ref 0.7–3.1)
MCH: 28.1 pg (ref 26.6–33.0)
MCHC: 30.6 g/dL — ABNORMAL LOW (ref 31.5–35.7)
MCV: 92 fL (ref 79–97)
MONOCYTES: 7 %
Monocytes Absolute: 0.3 10*3/uL (ref 0.1–0.9)
NEUTROS ABS: 1.9 10*3/uL (ref 1.4–7.0)
Neutrophils: 51 %
PLATELETS: 213 10*3/uL (ref 150–450)
RBC: 4.13 x10E6/uL (ref 3.77–5.28)
RDW: 14 % (ref 12.3–15.4)
WBC: 3.8 10*3/uL (ref 3.4–10.8)

## 2018-04-01 LAB — COMPREHENSIVE METABOLIC PANEL
ALBUMIN: 3.6 g/dL (ref 3.5–5.5)
ALT: 11 IU/L (ref 0–32)
AST: 15 IU/L (ref 0–40)
Albumin/Globulin Ratio: 1.4 (ref 1.2–2.2)
Alkaline Phosphatase: 67 IU/L (ref 39–117)
BUN / CREAT RATIO: 16 (ref 9–23)
BUN: 14 mg/dL (ref 6–20)
Bilirubin Total: 0.2 mg/dL (ref 0.0–1.2)
CALCIUM: 8.6 mg/dL — AB (ref 8.7–10.2)
CHLORIDE: 106 mmol/L (ref 96–106)
CO2: 25 mmol/L (ref 20–29)
Creatinine, Ser: 0.9 mg/dL (ref 0.57–1.00)
GFR, EST AFRICAN AMERICAN: 97 mL/min/{1.73_m2} (ref >59)
GFR, EST NON AFRICAN AMERICAN: 84 mL/min/{1.73_m2} (ref >59)
GLUCOSE: 81 mg/dL (ref 65–99)
Globulin, Total: 2.6 g/dL (ref 1.5–4.5)
Potassium: 4.1 mmol/L (ref 3.5–5.2)
Sodium: 140 mmol/L (ref 134–144)
TOTAL PROTEIN: 6.2 g/dL (ref 6.0–8.5)

## 2018-04-01 LAB — T4, FREE: Free T4: 1.61 ng/dL (ref 0.82–1.77)

## 2018-04-01 LAB — TSH: TSH: 2.29 u[IU]/mL (ref 0.450–4.500)

## 2018-05-13 ENCOUNTER — Ambulatory Visit: Payer: Self-pay | Admitting: Internal Medicine

## 2018-05-22 ENCOUNTER — Ambulatory Visit: Payer: Self-pay | Admitting: Internal Medicine

## 2018-07-21 ENCOUNTER — Ambulatory Visit: Payer: Self-pay | Admitting: Internal Medicine

## 2018-07-23 ENCOUNTER — Ambulatory Visit: Payer: Self-pay | Admitting: Internal Medicine

## 2018-07-23 ENCOUNTER — Encounter: Payer: Self-pay | Admitting: Internal Medicine

## 2018-07-23 VITALS — BP 122/80 | HR 76 | Resp 12 | Ht 67.5 in | Wt 185.0 lb

## 2018-07-23 DIAGNOSIS — F419 Anxiety disorder, unspecified: Secondary | ICD-10-CM | POA: Insufficient documentation

## 2018-07-23 DIAGNOSIS — L709 Acne, unspecified: Secondary | ICD-10-CM

## 2018-07-23 DIAGNOSIS — F329 Major depressive disorder, single episode, unspecified: Secondary | ICD-10-CM

## 2018-07-23 DIAGNOSIS — F32A Depression, unspecified: Secondary | ICD-10-CM | POA: Insufficient documentation

## 2018-07-23 MED ORDER — NORGESTIMATE-ETH ESTRADIOL 0.25-35 MG-MCG PO TABS: ORAL_TABLET | ORAL | 4 refills | Status: DC

## 2018-07-23 MED ORDER — BUPROPION HCL ER (SR) 150 MG PO TB12: ORAL_TABLET | ORAL | 2 refills | Status: DC

## 2018-07-23 NOTE — Progress Notes (Signed)
   Subjective:    Patient ID: Sara Mathis, female    DOB: 1986-09-10, 32 y.o.   MRN: [id number]  HPI  1.  Fatigue, nervousness--last visit for which she has not followed up.  CBC, CMP and TSH were all okay.    Depression screen PHQ 2/9 07/23/2018  Decreased Interest 2  Down, Depressed, Hopeless 2  PHQ - 2 Score 4  Altered sleeping 3  Tired, decreased energy 3  Change in appetite 3  Feeling bad or failure about yourself  1  Trouble concentrating 3  Moving slowly or fidgety/restless 0  Suicidal thoughts 0  PHQ-9 Score 17  Difficult doing work/chores Very difficult    GAD 7 : Generalized Anxiety Score 07/23/2018  Nervous, Anxious, on Edge 0  Control/stop worrying 1  Worry too much - different things 1  Trouble relaxing 0  Restless 1  Easily annoyed or irritable 3  Afraid - awful might happen 1  Total GAD 7 Score 7  Anxiety Difficulty Somewhat difficult   Her partner is already going to [employer].  She would need to see in [facility] if there is another Spanish Scientist, product/process development. No history of seizure.  2.  Acne:  She would like to talk about BCPs to see if would help with acne.  Has been on BCPs previously with improved control of acne.  Took Diane 35 in past, which is not available in [place].  LMP:  07/09/18.   Nonsmoker No history of DVTS/blood clotting abnormalities  Current Meds  Medication Sig  . amoxicillin (AMOXIL) 500 MG capsule TK 1 C PO Q 8 H UNTIL GONE  . ibuprofen (ADVIL,MOTRIN) 800 MG tablet Take 1 tablet (800 mg total) by mouth 3 (three) times daily.  Marland Kitchen levothyroxine (SYNTHROID, LEVOTHROID) 125 MCG tablet Take 1 tablet (125 mcg total) by mouth daily before breakfast.  . methylPREDNISolone (MEDROL DOSEPAK) 4 MG TBPK tablet Take 4 mg by mouth. As directed    No Known Allergies   Review of Systems     Objective:   Physical Exam NAD Left eye with a bit of proptosis and lid lag.  States this unchanged in past  year.  Swelling of left cheek with dental issues for which she is being treated. Lungs:  CTA CV:  RRR without murmur or rub.  Radial pulses normal and equal. Skin:  Acneiform lesions on hairline of face and entire back.  Mainly pustules.       Assessment & Plan:  1.  Depression/mild anxiety:  Bupropion ER 150 mg once daily for 3 days, then 1 tab by mouth twice daily thereafter. Follow up in 1 week. To call if significant increase in anxiety.  2.  Acne:  Sprintec 28 day.  Hold on antibiotics at this point.

## 2018-07-30 ENCOUNTER — Ambulatory Visit: Payer: Self-pay | Admitting: Internal Medicine

## 2018-07-30 ENCOUNTER — Encounter: Payer: Self-pay | Admitting: Internal Medicine

## 2018-07-30 VITALS — BP 112/88 | HR 70 | Resp 12 | Ht 67.5 in | Wt 184.0 lb

## 2018-07-30 DIAGNOSIS — F32A Depression, unspecified: Secondary | ICD-10-CM

## 2018-07-30 DIAGNOSIS — F329 Major depressive disorder, single episode, unspecified: Secondary | ICD-10-CM

## 2018-07-30 NOTE — Progress Notes (Signed)
   Subjective:    Patient ID: Sara Mathis, female    DOB: 1986/09/05, 32 y.o.   MRN: [id number]  HPI   1.  Depression:  Started Bupropion SR 150 mg once daily on the 7th, but had nausea and so moved the dose to the evening time and felt better.  She took her thyroid hormone in the morning the first day she took bupropion, then took the bupropion 1/2 hour later and then ate.   She feels a little tired with the medication.   No suicidal ideation Is having bad dreams.  Not every night.  Other nights, she is sleeping well.    Current Meds  Medication Sig  . buPROPion (WELLBUTRIN SR) 150 MG 12 hr tablet 1 tab by mouth in the morning for 3 days then 1 tab by mouth twice daily thereafter  . levothyroxine (SYNTHROID, LEVOTHROID) 125 MCG tablet Take 1 tablet (125 mcg total) by mouth daily before breakfast.    No Known Allergies     Review of Systems     Objective:   Physical Exam NAD Smiling.       Assessment & Plan:  Depression:  She will increase to twice daily dosing of the bupropion when she feels her nightmares are better.  Encouraged her to take at end of evening meal instead of at bedtime. To take the morning dose when she starts after breakfast, not before. To call if problems with the medication. Follow up in 6 weeks.

## 2018-08-05 ENCOUNTER — Ambulatory Visit: Payer: Self-pay | Admitting: Internal Medicine

## 2018-09-26 ENCOUNTER — Ambulatory Visit: Payer: Self-pay | Admitting: Internal Medicine

## 2018-10-03 ENCOUNTER — Ambulatory Visit: Payer: Self-pay | Admitting: Internal Medicine

## 2018-12-27 ENCOUNTER — Other Ambulatory Visit: Payer: Self-pay | Admitting: Internal Medicine

## 2018-12-27 DIAGNOSIS — E890 Postprocedural hypothyroidism: Secondary | ICD-10-CM

## 2019-04-22 ENCOUNTER — Ambulatory Visit: Payer: Self-pay | Admitting: Internal Medicine

## 2019-05-15 ENCOUNTER — Encounter: Payer: Self-pay | Admitting: Internal Medicine

## 2019-05-15 ENCOUNTER — Ambulatory Visit: Payer: Self-pay | Admitting: Internal Medicine

## 2019-05-15 ENCOUNTER — Other Ambulatory Visit: Payer: Self-pay

## 2019-05-15 VITALS — BP 122/80 | HR 78 | Resp 12 | Ht 67.5 in | Wt 202.0 lb

## 2019-05-15 DIAGNOSIS — K0889 Other specified disorders of teeth and supporting structures: Secondary | ICD-10-CM

## 2019-05-15 DIAGNOSIS — E890 Postprocedural hypothyroidism: Secondary | ICD-10-CM

## 2019-05-15 DIAGNOSIS — J3089 Other allergic rhinitis: Secondary | ICD-10-CM

## 2019-05-15 MED ORDER — LEVOTHYROXINE SODIUM 125 MCG PO TABS: 125.0000 ug | ORAL_TABLET | Freq: Every day | ORAL | 2 refills | Status: DC

## 2019-05-15 MED ORDER — CETIRIZINE HCL 10 MG PO TABS: 10.0000 mg | ORAL_TABLET | Freq: Every day | ORAL | 6 refills | Status: DC

## 2019-05-15 NOTE — Progress Notes (Signed)
    Subjective:    Patient ID: Sara Mathis, female   DOB: 1986-03-03, 33 y.o.   MRN: [id number]   HPI   1.  Postoperative hypothyroidism (previous hyperthyroidism):  She missed most of July with her hormone replacement and several days in August.  States she missed during her period as she just didn't feel well then.   Discussed her periods are affected by being low on thyroid hormone.  2.  Right ear pain:  For 3 days.  Has had water in her ear with taking a shower recently.   The pain comes and goes.  Manipulating the pinnae does not make the pain worse.   No fever.   Has had throat pain in the right on and off as well.   No cough.   Has taken ibuprofen 1-2 tabs with minimal effect.    Current Meds  Medication Sig  . levothyroxine (SYNTHROID, LEVOTHROID) 125 MCG tablet TAKE 1 TABLET BY MOUTH DAILY BEFORE MEALS   No Known Allergies   Review of Systems    Objective:   BP 122/80 (BP Location: Left Arm, Patient Position: Sitting, Cuff Size: Normal)   Pulse 78   Resp 12   Ht 5' 7.5" (1.715 m)   Wt 202 lb (91.6 kg)   LMP 04/27/2019   BMI 31.17 kg/m   Physical Exam  NAD Significant weight gain over time. HEENT: PERRL, EOMI, minor exophthalmos of left eye. TMs pearly gray, but canals dry without any cerumen.  Nasal mucosa swollen and red.  Posterior pharynx with cobbling and mild erythema.  No exudate.  No swelling or fluctuance of posterior molar of left upper jaw Neck:  Supple.  Tender over left SCM muscle--possibly reproduces the tenderness. Chest:  CTA CV:  RRR without murmur or rub.  Radial pulses normal and equal. LE: No edema  Assessment & Plan  1  Postoperative hypothyroidism:  Discussed needs to not miss her thyroid hormone replacement.  Very concerned with her weight gain as a consequence. Fill for 3 months with TSH in 8 weeks. Needs to be seen at least once yearly to have yearly refill.  2.  Allergies: Zyrtec 10 mg daily.  3.  Dental  pain concern:  Dental referral.

## 2019-07-10 ENCOUNTER — Other Ambulatory Visit: Payer: Self-pay

## 2019-07-20 ENCOUNTER — Other Ambulatory Visit (INDEPENDENT_AMBULATORY_CARE_PROVIDER_SITE_OTHER): Payer: Self-pay

## 2019-07-20 ENCOUNTER — Other Ambulatory Visit: Payer: Self-pay

## 2019-07-20 DIAGNOSIS — E890 Postprocedural hypothyroidism: Secondary | ICD-10-CM

## 2019-07-21 LAB — TSH: TSH: 0.194 u[IU]/mL — ABNORMAL LOW (ref 0.450–4.500)

## 2019-07-29 ENCOUNTER — Telehealth: Payer: Self-pay | Admitting: Internal Medicine

## 2019-07-29 ENCOUNTER — Other Ambulatory Visit: Payer: Self-pay

## 2019-07-29 DIAGNOSIS — Z20822 Contact with and (suspected) exposure to covid-19: Secondary | ICD-10-CM

## 2019-07-29 NOTE — Telephone Encounter (Signed)
Patient called today stating was presenting with COVID19 symptoms like fever, chills, fatigue and really bad headache. Patient provided with information to get tested at [facility]. Verbalized understanding and stated will go today.

## 2019-07-31 LAB — NOVEL CORONAVIRUS, NAA: SARS-CoV-2, NAA: NOT DETECTED

## 2019-08-10 ENCOUNTER — Other Ambulatory Visit: Payer: Self-pay | Admitting: Internal Medicine

## 2019-09-25 ENCOUNTER — Other Ambulatory Visit: Payer: Self-pay

## 2019-09-25 ENCOUNTER — Other Ambulatory Visit (INDEPENDENT_AMBULATORY_CARE_PROVIDER_SITE_OTHER): Payer: Self-pay

## 2019-09-25 DIAGNOSIS — E079 Disorder of thyroid, unspecified: Secondary | ICD-10-CM

## 2019-09-26 LAB — T4, FREE: Free T4: 1.82 ng/dL — ABNORMAL HIGH (ref 0.82–1.77)

## 2019-09-26 LAB — TSH: TSH: 1.43 u[IU]/mL (ref 0.450–4.500)

## 2019-11-13 ENCOUNTER — Ambulatory Visit: Payer: Self-pay | Admitting: Internal Medicine

## 2019-11-13 ENCOUNTER — Other Ambulatory Visit: Payer: Self-pay | Admitting: Internal Medicine

## 2020-01-14 ENCOUNTER — Encounter: Payer: Self-pay | Admitting: Internal Medicine

## 2020-01-14 ENCOUNTER — Ambulatory Visit: Payer: Self-pay | Admitting: Internal Medicine

## 2020-01-14 ENCOUNTER — Other Ambulatory Visit: Payer: Self-pay

## 2020-01-14 VITALS — BP 102/70 | HR 70 | Resp 12 | Ht 67.5 in | Wt 187.0 lb

## 2020-01-14 DIAGNOSIS — F32A Depression, unspecified: Secondary | ICD-10-CM

## 2020-01-14 DIAGNOSIS — F329 Major depressive disorder, single episode, unspecified: Secondary | ICD-10-CM

## 2020-01-14 DIAGNOSIS — E890 Postprocedural hypothyroidism: Secondary | ICD-10-CM

## 2020-01-14 MED ORDER — BUPROPION HCL ER (SR) 150 MG PO TB12: ORAL_TABLET | ORAL | 2 refills | Status: DC

## 2020-01-14 MED ORDER — LEVOTHYROXINE SODIUM 125 MCG PO TABS: ORAL_TABLET | ORAL | 3 refills | Status: DC

## 2020-01-14 NOTE — Progress Notes (Signed)
    Subjective:    Patient ID: Sara Mathis, female   DOB: Dec 10, 1985, 34 y.o.   MRN: [id number]   HPI   1.  Depression:  Previously on Bupropion SR 150 mg twice daily. She is currently counseling with Nicola Police, LCSWA at [facility] in [place].   [phone]  Ext:  [phone].  Working with her for 2 months. Elray Mcgregor has apparently recommended she get back on medication. She was previously married to Oakley, another patient here, but they are no longer together.   She is now living with a friend and looking for a more permament situation. They are working on improvement of self esteem.   She does not feel she has support of previous friends   Depression screen [facility] 2/9 01/14/2020 07/23/2018  Decreased Interest 3 2  Down, Depressed, Hopeless 3 2  PHQ - 2 Score 6 4  Altered sleeping 3 3  Tired, decreased energy 3 3  Change in appetite 3 3  Feeling bad or failure about yourself  3 1  Trouble concentrating 2 3  Moving slowly or fidgety/restless 3 0  Suicidal thoughts 1 0  PHQ-9 Score 24 17  Difficult doing work/chores Extremely dIfficult Very difficult        Current Meds  Medication Sig  . levothyroxine (SYNTHROID) 125 MCG tablet TAKE 1 TABLET BY MOUTH EVERY DAY BEFORE BREAKFAST   No Known Allergies   Review of Systems    Objective:   BP 102/70 (BP Location: Left Arm, Patient Position: Sitting, Cuff Size: Normal)   Pulse 70   Resp 12   Ht 5' 7.5" (1.715 m)   Wt 187 lb (84.8 kg)   LMP 01/06/2020   BMI 28.86 kg/m   Physical Exam  NAD Tearful going through PHQ 9 Lungs:  CTA CV:  RRR without murmur or rub.   Assessment & Plan   1.  Hypothyroidism, post surgical for Grave's Disease.  TSH  2.  Depression:  Continue with Nicola Police for counseling.  Restart Bupropion SR 150 mg once daily for 3 days, then increase to twice daily thereafter. Call if significant in anxiety. Virtual visit in 1 and 6 weeks. Discussed recommend  treatment for at least 1 year before considering tapering off--perhaps taking indefinitely.

## 2020-01-15 LAB — TSH: TSH: 1.84 u[IU]/mL (ref 0.450–4.500)

## 2020-01-18 ENCOUNTER — Telehealth: Payer: Self-pay | Admitting: Internal Medicine

## 2020-01-25 ENCOUNTER — Telehealth: Payer: Self-pay | Admitting: Internal Medicine

## 2020-02-25 ENCOUNTER — Telehealth (INDEPENDENT_AMBULATORY_CARE_PROVIDER_SITE_OTHER): Payer: Self-pay | Admitting: Internal Medicine

## 2020-02-25 DIAGNOSIS — F329 Major depressive disorder, single episode, unspecified: Secondary | ICD-10-CM

## 2020-02-25 DIAGNOSIS — F32A Depression, unspecified: Secondary | ICD-10-CM

## 2020-02-25 MED ORDER — BUPROPION HCL ER (SR) 150 MG PO TB12: ORAL_TABLET | ORAL | 11 refills | Status: DC

## 2020-02-25 NOTE — Progress Notes (Signed)
    Subjective:    Patient ID: Sara Mathis, female   DOB: 17-Dec-1985, 34 y.o.   MRN: [id number]   HPI   Depression:  Taking Bupropion SR 150 mg twice daily.  Taking second dose between 9- 10 p.m.  Dinner is very variable.  No real schedule as not working.   Takes first dose at 10 a.m. Trying to exercise every afternoon.   Does this around 6 p.m. Also having coffee at 6 p.m. Feels the medication has helped with her depression.  She is more relaxed with more energy.  No negative thoughts.   Continues to counsel with Lelon Mast.  Has not seen her since starting Bupropion.  Current Meds  Medication Sig  . buPROPion (WELLBUTRIN SR) 150 MG 12 hr tablet 1 tab by mouth daily in morning for 3 days, thereafter 1 tab by mouth twice daily  . cetirizine (ZYRTEC) 10 MG tablet Take 1 tablet (10 mg total) by mouth daily.  Marland Kitchen levothyroxine (SYNTHROID) 125 MCG tablet TAKE 1 TABLET BY MOUTH EVERY DAY BEFORE BREAKFAST   No Known Allergies   Review of Systems    Objective:   There were no vitals taken for this visit.  Physical Exam   Appears calm, happy   Assessment & Plan   Depression:  Doing well on Bupropion SR 150 mg twice daily.  Refilled for a year.  Continue counseling with Nicola Police, LCSW Follow up in 4 months.

## 2020-05-10 ENCOUNTER — Other Ambulatory Visit: Payer: Self-pay

## 2020-05-10 ENCOUNTER — Telehealth: Payer: Self-pay | Admitting: Internal Medicine

## 2020-05-10 NOTE — Telephone Encounter (Signed)
Patient on the phone stating is having really bad headache, sore throat, body aches, nausea x 3 days. Patient denied fever or diarrhea. Patient stated has been fully vaccinated for covid19  but got exposed with a someone was confirmed + with covid19.  Information shared with Cherice; patient coming at 2:30 pm today to get tested.

## 2020-05-30 ENCOUNTER — Encounter: Payer: Self-pay | Admitting: Internal Medicine

## 2020-05-30 ENCOUNTER — Ambulatory Visit: Payer: Self-pay | Admitting: Internal Medicine

## 2020-06-17 HISTORY — PX: THERAPEUTIC ABORTION: SHX798

## 2020-06-30 ENCOUNTER — Encounter: Payer: Self-pay | Admitting: Internal Medicine

## 2020-06-30 ENCOUNTER — Ambulatory Visit: Payer: Self-pay | Admitting: Internal Medicine

## 2020-06-30 VITALS — BP 116/81 | HR 99 | Resp 12 | Ht 67.5 in | Wt 186.5 lb

## 2020-06-30 DIAGNOSIS — E890 Postprocedural hypothyroidism: Secondary | ICD-10-CM

## 2020-06-30 DIAGNOSIS — F32A Depression, unspecified: Secondary | ICD-10-CM

## 2020-06-30 DIAGNOSIS — M6740 Ganglion, unspecified site: Secondary | ICD-10-CM

## 2020-06-30 NOTE — Progress Notes (Signed)
    Subjective:    Patient ID: Sara Mathis, female   DOB: 02/27/1986, 34 y.o.   MRN: [id number]   HPI   1.  Depression:  Continues on Bupropion SR 150 mg twice daily.  Continues with counseling with Nicola Police, LCSW [facility] [place] [facility] monthly.  Both feel she is doing better.  She is living in [place] now.  She lives with others, rents a room.  Not working.    2.  Post treatment of Graves Disease hypothyroidism:  Taking levothyroxine alone in the morning.  3.  Bump developed at anterolateral aspect of right knee, lateral to distal patella.  No pain at the time, but having some pain mainly when in bed trying to sleep.   No history of injury, but was doing squats with her work out--low weight of 6 lbs.   Current Meds  Medication Sig  . buPROPion (WELLBUTRIN SR) 150 MG 12 hr tablet 1 tab by mouth daily in morning for 3 days, thereafter 1 tab by mouth twice daily  . levothyroxine (SYNTHROID) 125 MCG tablet TAKE 1 TABLET BY MOUTH EVERY DAY BEFORE BREAKFAST   No Known Allergies   Review of Systems    Objective:   BP 116/81 (BP Location: Left Arm, Patient Position: Sitting, Cuff Size: Normal)   Pulse 99   Resp 12   Ht 5' 7.5" (1.715 m)   Wt 186 lb 8 oz (84.6 kg)   LMP 06/28/2020   BMI 28.78 kg/m   Physical Exam NAD 3 cm cystic lesion without overlying erythema and nontender   Assessment & Plan   1.  Depression:  Controlled.  Continue counseling and medication  2.  Hypothyroidism:  Adequately replaced.    3.  Right knee, likely ganglion cyst:  Not clear whether originating from the joint or other structure.  Ortho referral.  3.  Right knee

## 2020-07-07 ENCOUNTER — Ambulatory Visit: Payer: Self-pay | Admitting: Orthopaedic Surgery

## 2020-07-08 ENCOUNTER — Encounter: Payer: Self-pay | Admitting: Orthopaedic Surgery

## 2020-07-08 ENCOUNTER — Ambulatory Visit: Payer: Self-pay | Admitting: Orthopaedic Surgery

## 2020-07-08 ENCOUNTER — Ambulatory Visit (INDEPENDENT_AMBULATORY_CARE_PROVIDER_SITE_OTHER): Payer: Self-pay

## 2020-07-08 ENCOUNTER — Other Ambulatory Visit: Payer: Self-pay

## 2020-07-08 DIAGNOSIS — G8929 Other chronic pain: Secondary | ICD-10-CM

## 2020-07-08 DIAGNOSIS — M25561 Pain in right knee: Secondary | ICD-10-CM

## 2020-07-08 MED ORDER — METHYLPREDNISOLONE 4 MG PO TBPK: ORAL_TABLET | ORAL | 0 refills | Status: DC

## 2020-07-08 MED ORDER — MELOXICAM 7.5 MG PO TABS: 7.5000 mg | ORAL_TABLET | Freq: Two times a day (BID) | ORAL | 2 refills | Status: DC | PRN

## 2020-07-08 NOTE — Progress Notes (Signed)
Office Visit Note   Patient: Sara Mathis           Date of Birth: 1986/02/02           MRN: [id number] Visit Date: 07/08/2020              Requested by: [name], MD [place],  [place] [address] PCP: [name], MD   Assessment & Plan: Visit Diagnoses:  1. Chronic pain of right knee     Plan: Impression is right knee pain and swelling.  I suspect the process is either like a bursitis or an overuse tendinitis.  I do not get the sense that it is something structural or intra-articular.  I have recommended a period of relative rest as well as a Medrol Dosepak followed by 2 weeks of meloxicam as well as ice and Voltaren gel.  Patient instructed to follow-up if she does not notice any improvement.  Today's encounter was performed through an interpreter which added to the complexity.  Follow-Up Instructions: Return if symptoms worsen or fail to improve.   Orders:  Orders Placed This Encounter  Procedures   XR KNEE 3 VIEW RIGHT   Meds ordered this encounter  Medications   methylPREDNISolone (MEDROL DOSEPAK) 4 MG TBPK tablet    Sig: Use as directed    Dispense:  21 tablet    Refill:  0   meloxicam (MOBIC) 7.5 MG tablet    Sig: Take 1 tablet (7.5 mg total) by mouth 2 (two) times daily as needed for pain.    Dispense:  30 tablet    Refill:  2      Procedures: No procedures performed   Clinical Data: No additional findings.   Subjective: Chief Complaint  Patient presents with   Right Knee - Pain    Patient is a Hispanic female here with language interpreter for evaluation of 2 and half months of chronic right knee pain that is relatively mild.  She states that she may have aggravated when she was doing squats and there has been 2 occasions where the anterolateral aspect of the knee has focally swelled up.  It resolved after couple days.  She has a spongy feeling over the night.  Denies any mechanical symptoms.  Denies  any radiation or numbness and tingling.   Review of Systems  Constitutional: Negative.   HENT: Negative.   Eyes: Negative.   Respiratory: Negative.   Cardiovascular: Negative.   Endocrine: Negative.   Musculoskeletal: Negative.   Neurological: Negative.   Hematological: Negative.   Psychiatric/Behavioral: Negative.   All other systems reviewed and are negative.    Objective: Vital Signs: LMP 06/28/2020   Physical Exam Vitals and nursing note reviewed.  Constitutional:      Appearance: She is well-developed.  HENT:     Head: Normocephalic and atraumatic.  Pulmonary:     Effort: Pulmonary effort is normal.  Abdominal:     Palpations: Abdomen is soft.  Musculoskeletal:     Cervical back: Neck supple.  Skin:    General: Skin is warm.     Capillary Refill: Capillary refill takes less than 2 seconds.  Neurological:     Mental Status: She is alert and oriented to person, place, and time.  Psychiatric:        Behavior: Behavior normal.        Thought Content: Thought content normal.        Judgment: Judgment normal.  Ortho Exam Right knee shows normal range of motion without crepitus or pain.  Collaterals and cruciates are stable.  No joint line tenderness.  She has some slight swelling and tenderness over the anterolateral aspect of the knee near the distal IT band and Gertie's tubercle.  I do not feel any crepitus.   Specialty Comments:  No specialty comments available.  Imaging: XR KNEE 3 VIEW RIGHT  Result Date: 07/08/2020 No acute or structural abnormalities    PMFS History: Patient Active Problem List   Diagnosis Date Noted   Ganglion cyst 06/30/2020   Environmental and seasonal allergies 05/15/2019   Acne 07/23/2018   Anxiety 07/23/2018   Depression 07/23/2018   Overweight (BMI 25.0-29.9) 06/10/2017   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   Thyroid disease 09/17/2014   Past Medical History:  Diagnosis Date   Overweight (BMI  25.0-29.9) 06/10/2017   Thyroid disease 02/21/2015   Grave's disease resulting in total thyroidectomy    Family History  Problem Relation Age of Onset   Diabetes Mother    Fibromyalgia Mother    Heart disease Mother        age 25 and 41   Diabetes Father    ADD / ADHD Brother     Past Surgical History:  Procedure Laterality Date   THYROIDECTOMY Bilateral 02/21/2015   For Grave's Disease not well controlled on medication   Social History   Occupational History   Occupation: Teacher, music with company in [place] Use   Smoking status: Never Smoker   Smokeless tobacco: Never Used  Scientific laboratory technician Use: Never used  Substance and Sexual Activity   Alcohol use: Yes    Alcohol/week: 0.0 standard drinks    Comment: whiskey or tequila shot maybe twice monthly   Drug use: No   Sexual activity: Yes    Birth control/protection: None

## 2020-12-29 ENCOUNTER — Ambulatory Visit: Payer: Self-pay | Admitting: Internal Medicine

## 2020-12-29 ENCOUNTER — Other Ambulatory Visit: Payer: Self-pay

## 2020-12-29 ENCOUNTER — Encounter: Payer: Self-pay | Admitting: Internal Medicine

## 2020-12-29 ENCOUNTER — Other Ambulatory Visit: Payer: Self-pay | Admitting: Internal Medicine

## 2020-12-29 VITALS — BP 112/72 | HR 68 | Resp 12 | Ht 66.25 in | Wt 170.0 lb

## 2020-12-29 DIAGNOSIS — Z0120 Encounter for dental examination and cleaning without abnormal findings: Secondary | ICD-10-CM

## 2020-12-29 DIAGNOSIS — Z0000 Encounter for general adult medical examination without abnormal findings: Secondary | ICD-10-CM

## 2020-12-29 DIAGNOSIS — Z124 Encounter for screening for malignant neoplasm of cervix: Secondary | ICD-10-CM

## 2020-12-29 DIAGNOSIS — Z9189 Other specified personal risk factors, not elsewhere classified: Secondary | ICD-10-CM

## 2020-12-29 DIAGNOSIS — L709 Acne, unspecified: Secondary | ICD-10-CM

## 2020-12-29 DIAGNOSIS — E890 Postprocedural hypothyroidism: Secondary | ICD-10-CM

## 2020-12-29 MED ORDER — CALCIUM CITRATE-VITAMIN D 500-400 MG-UNIT PO CHEW: CHEWABLE_TABLET | ORAL | Status: DC

## 2020-12-29 NOTE — Progress Notes (Signed)
Subjective:    Patient ID: Sara Mathis, female   DOB: 1986/01/10, 35 y.o.   MRN: [id number]   HPI   CPE with pap  1.  Pap:  Last pap 10/2017 and normal.  2.  Mammogram:  Never.  No family history of breast cancer.  3.  Osteoprevention:  Does not like dairy much.  Unlikely to drink or eat enough for adequate calcium and vitamin D intake.  3 times weekly walks 3 miles.  No family history of osteoporosis.   4.  Guaiac Cards:  Never.    5.  Colonoscopy:  Never.  No family history of colon cancer.   6.  Immunizations:  Did not obtain influenza vaccine this year.  Immunization History  Administered Date(s) Administered  . Influenza,inj,Quad PF,6+ Mos 11/18/2015  . Moderna Sars-Covid-2 Vaccination 01/18/2020, 02/16/2020, 09/19/2020  . Tdap 06/10/2017     7.  Glucose/Cholesterol:  Blood glucose has been fine in past.  FLP fine in 2019 Lipid Panel     Component Value Date/Time   CHOL 191 11/06/2017 1207   TRIG 95 11/06/2017 1207   HDL 61 11/06/2017 1207   LDLCALC 111 (H) 11/06/2017 1207   LABVLDL 19 11/06/2017 1207     Current Meds  Medication Sig  . cetirizine (ZYRTEC) 10 MG tablet Take 1 tablet (10 mg total) by mouth daily.  Marland Kitchen levothyroxine (SYNTHROID) 125 MCG tablet TAKE 1 TABLET BY MOUTH EVERY DAY BEFORE BREAKFAST   No Known Allergies  Past Medical History:  Diagnosis Date  . Overweight (BMI 25.0-29.9) 06/10/2017  . Thyroid disease 02/21/2015   Grave's disease resulting in total thyroidectomy   Past Surgical History:  Procedure Laterality Date  . THERAPEUTIC ABORTION  06/2020   At 6 weeks  . THYROIDECTOMY Bilateral 02/21/2015   For Grave's Disease not well controlled on medication     Family History  Problem Relation Age of Onset  . Diabetes Mother   . Fibromyalgia Mother   . Heart disease Mother        age 75 and 20  . Diabetes Father   . ADD / ADHD Brother     Social History   Socioeconomic History  . Marital status:  Soil scientist    Spouse name: Not on file  . Number of children: 2  . Years of education: 10  . Highest education level: Not on file  Occupational History  . 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  Tobacco Use  . Smoking status: Never Smoker  . Smokeless tobacco: Never Used  Vaping Use  . Vaping Use: Never used  Substance and Sexual Activity  . Alcohol use: Not Currently    Alcohol/week: 0.0 standard drinks    Comment: whiskey or tequila shot maybe twice monthly  . Drug use: No  . Sexual activity: Not Currently    Birth control/protection: None  Other Topics Concern  . Not on file  Social History Narrative   Living with [name]--just friends-here in [place].   Previous female partner is patient here as well.   Still in contact with [name]'s children (she was parenting them when they were together)   Social Determinants of Health   Financial Resource Strain: Low Risk   . Difficulty of Paying Living Expenses: Not hard at all  Food Insecurity: No Food Insecurity  . Worried About Charity fundraiser in the Last Year: Never true  . Ran Out of Food in the Last Year: Never true  Transportation Needs: Not on file  Physical Activity: Not on file  Stress: Not on file  Social Connections: Not on file  Intimate Partner Violence: Not At Risk  . Fear of Current or Ex-Partner: No  . Emotionally Abused: No  . Physically Abused: No  . Sexually Abused: No    Review of Systems  Constitutional: Negative for fatigue.  HENT: Negative for dental problem (Would like dental work.  ), hearing loss, rhinorrhea, sneezing and sore throat.   Eyes: Negative for visual disturbance.  Respiratory: Negative for cough and shortness of breath.   Cardiovascular: Negative for chest pain, palpitations and leg swelling.  Gastrointestinal: Negative for abdominal pain, blood in stool (No melena), constipation and diarrhea.  Genitourinary: Negative for dysuria and menstrual problem.   Musculoskeletal:       Did have knee pain last fall, but seems to have improved with her 20-30 lb weight loss over past 2 years.  Skin: Negative for rash.       No changes in skin lesions.  Neurological: Negative for weakness and numbness.  Psychiatric/Behavioral: Negative for dysphoric mood. The patient is not nervous/anxious.       Objective:   BP 112/72 (BP Location: Right Arm, Patient Position: Sitting, Cuff Size: Normal)   Pulse 68   Resp 12   Ht 5' 6.25" (1.683 m)   Wt 170 lb (77.1 kg)   LMP 12/25/2020 (Exact Date) Comment: regular  BMI 27.23 kg/m   Physical Exam Constitutional:      Appearance: Normal appearance.  HENT:     Head: Normocephalic and atraumatic.     Right Ear: Hearing, tympanic membrane, ear canal and external ear normal.     Left Ear: Hearing, tympanic membrane, ear canal and external ear normal.     Nose: Nose normal.     Mouth/Throat:     Lips: Pink.     Mouth: Mucous membranes are moist.     Pharynx: Oropharynx is clear.  Eyes:     Extraocular Movements: Extraocular movements intact.     Conjunctiva/sclera: Conjunctivae normal.     Pupils: Pupils are equal, round, and reactive to light.     Funduscopic exam:    Right eye: Red reflex present.        Left eye: Red reflex present.    Comments: Discs sharp bilaterally.  Neck:     Thyroid: No thyromegaly.  Cardiovascular:     Rate and Rhythm: Normal rate and regular rhythm.     Pulses: Normal pulses.     Heart sounds: Normal heart sounds, S1 normal and S2 normal. No S3 or S4 sounds.      Comments: Carotid, radial, femoral, DP and PT pulses normal and equal.  Pulmonary:     Effort: Pulmonary effort is normal.     Breath sounds: Normal breath sounds.  Chest:  Breasts:     Right: No swelling, inverted nipple, mass, nipple discharge, skin change, tenderness, axillary adenopathy or supraclavicular adenopathy.     Left: No swelling, inverted nipple, mass, nipple discharge, skin change, tenderness,  axillary adenopathy or supraclavicular adenopathy.    Abdominal:     General: Abdomen is flat. Bowel sounds are normal.     Palpations: Abdomen is soft. There is no hepatomegaly, splenomegaly or mass.     Tenderness: There is no abdominal tenderness.     Hernia: No hernia is present.  Genitourinary:    Comments: Normal external female genitalia Minimal blood from cervical os No cervical or  vaginal lesion No uterine or adnexal mass or tenderness. Musculoskeletal:        General: Normal range of motion.     Cervical back: Normal range of motion and neck supple.  Lymphadenopathy:     Head:     Right side of head: No submental or submandibular adenopathy.     Left side of head: No submental or submandibular adenopathy.     Cervical: No cervical adenopathy.     Upper Body:     Right upper body: No supraclavicular or axillary adenopathy.     Left upper body: No supraclavicular or axillary adenopathy.     Lower Body: No right inguinal adenopathy. No left inguinal adenopathy.  Skin:    General: Skin is warm.     Capillary Refill: Capillary refill takes less than 2 seconds.     Comments: Acne lesions at differing stages on back an buttocks.  Not noted elsewhere. Multiple tattoos  Neurological:     Mental Status: She is alert.     Cranial Nerves: Cranial nerves are intact.     Sensory: Sensation is intact.     Motor: Motor function is intact.     Coordination: Coordination is intact.     Gait: Gait is intact.     Deep Tendon Reflexes: Reflexes are normal and symmetric.  Psychiatric:        Attention and Perception: Attention normal.        Mood and Affect: Mood normal.        Speech: Speech normal.        Behavior: Behavior normal. Behavior is cooperative.        Thought Content: Thought content normal.        Judgment: Judgment normal.      Assessment & Plan  1.  CPE with pap To return for fasting labs. Applauded weight loss Encouraged calling in August for influenza  vaccine clinic dates  2.  Dental Care:  Needs to apply for [facility] orange card for dental referral  3.  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

## 2021-01-06 LAB — CYTOLOGY - PAP

## 2021-02-27 ENCOUNTER — Other Ambulatory Visit: Payer: Self-pay | Admitting: Internal Medicine

## 2021-02-27 ENCOUNTER — Other Ambulatory Visit: Payer: Self-pay

## 2021-02-27 DIAGNOSIS — E890 Postprocedural hypothyroidism: Secondary | ICD-10-CM

## 2021-02-27 DIAGNOSIS — Z130 Encounter for screening for diseases of the blood and blood-forming organs and certain disorders involving the immune mechanism: Secondary | ICD-10-CM

## 2021-02-27 DIAGNOSIS — Z13220 Encounter for screening for lipoid disorders: Secondary | ICD-10-CM

## 2021-02-27 DIAGNOSIS — Z131 Encounter for screening for diabetes mellitus: Secondary | ICD-10-CM

## 2021-02-28 LAB — CBC WITH DIFFERENTIAL/PLATELET
Basophils Absolute: 0 10*3/uL (ref 0.0–0.2)
Basos: 1 %
EOS (ABSOLUTE): 0 10*3/uL (ref 0.0–0.4)
Eos: 1 %
Hematocrit: 40.9 % (ref 34.0–46.6)
Hemoglobin: 13.1 g/dL (ref 11.1–15.9)
Immature Grans (Abs): 0 10*3/uL (ref 0.0–0.1)
Immature Granulocytes: 0 %
Lymphocytes Absolute: 1.5 10*3/uL (ref 0.7–3.1)
Lymphs: 38 %
MCH: 31 pg (ref 26.6–33.0)
MCHC: 32 g/dL (ref 31.5–35.7)
MCV: 97 fL (ref 79–97)
Monocytes Absolute: 0.3 10*3/uL (ref 0.1–0.9)
Monocytes: 8 %
Neutrophils Absolute: 2 10*3/uL (ref 1.4–7.0)
Neutrophils: 52 %
Platelets: 181 10*3/uL (ref 150–450)
RBC: 4.22 x10E6/uL (ref 3.77–5.28)
RDW: 11.5 % — ABNORMAL LOW (ref 11.7–15.4)
WBC: 3.8 10*3/uL (ref 3.4–10.8)

## 2021-02-28 LAB — COMPREHENSIVE METABOLIC PANEL
ALT: 10 IU/L (ref 0–32)
AST: 15 IU/L (ref 0–40)
Albumin/Globulin Ratio: 1.7 (ref 1.2–2.2)
Albumin: 4 g/dL (ref 3.8–4.8)
Alkaline Phosphatase: 56 IU/L (ref 44–121)
BUN/Creatinine Ratio: 19 (ref 9–23)
BUN: 14 mg/dL (ref 6–20)
Bilirubin Total: 0.3 mg/dL (ref 0.0–1.2)
CO2: 22 mmol/L (ref 20–29)
Calcium: 9 mg/dL (ref 8.7–10.2)
Chloride: 102 mmol/L (ref 96–106)
Creatinine, Ser: 0.73 mg/dL (ref 0.57–1.00)
Globulin, Total: 2.3 g/dL (ref 1.5–4.5)
Glucose: 87 mg/dL (ref 65–99)
Potassium: 4.4 mmol/L (ref 3.5–5.2)
Sodium: 142 mmol/L (ref 134–144)
Total Protein: 6.3 g/dL (ref 6.0–8.5)
eGFR: 109 mL/min/{1.73_m2} (ref >59)

## 2021-02-28 LAB — TSH: TSH: 3.43 u[IU]/mL (ref 0.450–4.500)

## 2021-02-28 LAB — LIPID PANEL W/O CHOL/HDL RATIO
Cholesterol, Total: 174 mg/dL (ref 100–199)
HDL: 59 mg/dL (ref >39)
LDL Chol Calc (NIH): 100 mg/dL — ABNORMAL HIGH (ref 0–99)
Triglycerides: 83 mg/dL (ref 0–149)
VLDL Cholesterol Cal: 15 mg/dL (ref 5–40)

## 2021-04-13 ENCOUNTER — Telehealth: Payer: Self-pay | Admitting: Internal Medicine

## 2021-04-13 MED ORDER — LEVOTHYROXINE SODIUM 125 MCG PO TABS: ORAL_TABLET | ORAL | 2 refills | Status: DC

## 2021-04-13 NOTE — Telephone Encounter (Signed)
Patient called requesting medication refill for levothyroxine (SYNTHROID) 125 MCG tablet [id number]    Please send to default pharmacy.  Pt. Also asked regarding pap results from April

## 2021-04-13 NOTE — Telephone Encounter (Signed)
Rx sent, Pt notified. Also notified that pap results have not come back. Pt will call back in a week to check in

## 2021-05-16 ENCOUNTER — Telehealth: Payer: Self-pay

## 2021-05-16 NOTE — Telephone Encounter (Signed)
Call [facility] and find out what the results were--have them fax a copy and also see about putting through Epic appropriately so flows into her chart.

## 2021-05-16 NOTE — Telephone Encounter (Signed)
Pt call asking for pap smear results from April. Pt stated she has not received any information

## 2021-05-19 NOTE — Telephone Encounter (Signed)
Pt notified of lab results

## 2021-10-09 ENCOUNTER — Telehealth: Payer: Self-pay

## 2021-10-09 NOTE — Telephone Encounter (Signed)
Patient called to report that she took a Pregnancy test last week and result was positive. Her last period was on 08/14/21. She is unsure of next steps.

## 2021-10-17 ENCOUNTER — Other Ambulatory Visit: Payer: Self-pay

## 2021-10-17 DIAGNOSIS — N912 Amenorrhea, unspecified: Secondary | ICD-10-CM

## 2021-10-17 LAB — POCT URINE PREGNANCY: Preg Test, Ur: POSITIVE — AB

## 2021-10-17 NOTE — Telephone Encounter (Signed)
Has been seen by Dr Amil Amen

## 2021-10-17 NOTE — Progress Notes (Signed)
Patient here to be tested for pregnancy. LMP 08/18/2021 EDC: 05/25/2022 Urine HCG + Biologic father will be supportive, but they are not together.   Discussed applying for Medicaid and if does not meet criteria, to go to [facility]

## 2021-11-01 ENCOUNTER — Encounter: Payer: Self-pay | Admitting: Student

## 2021-11-01 ENCOUNTER — Inpatient Hospital Stay (HOSPITAL_COMMUNITY)
Admission: AD | Admit: 2021-11-01 | Discharge: 2021-11-01 | Disposition: A | Discharge status: Home / Self Care | Payer: Self-pay | Attending: Obstetrics & Gynecology | Admitting: Obstetrics & Gynecology

## 2021-11-01 ENCOUNTER — Inpatient Hospital Stay (HOSPITAL_COMMUNITY): Payer: Self-pay

## 2021-11-01 DIAGNOSIS — O209 Hemorrhage in early pregnancy, unspecified: Secondary | ICD-10-CM | POA: Insufficient documentation

## 2021-11-01 DIAGNOSIS — Z3A11 11 weeks gestation of pregnancy: Secondary | ICD-10-CM | POA: Insufficient documentation

## 2021-11-01 DIAGNOSIS — O3680X0 Pregnancy with inconclusive fetal viability, not applicable or unspecified: Secondary | ICD-10-CM

## 2021-11-01 DIAGNOSIS — Z79899 Other long term (current) drug therapy: Secondary | ICD-10-CM | POA: Insufficient documentation

## 2021-11-01 DIAGNOSIS — Z3A01 Less than 8 weeks gestation of pregnancy: Secondary | ICD-10-CM

## 2021-11-01 HISTORY — DX: Other specified health status: Z78.9

## 2021-11-01 LAB — ABO/RH: ABO/RH(D): O POS

## 2021-11-01 LAB — HIV ANTIBODY (ROUTINE TESTING W REFLEX): HIV Screen 4th Generation wRfx: NONREACTIVE

## 2021-11-01 LAB — CBC
HCT: 38.2 % (ref 36.0–46.0)
Hemoglobin: 12 g/dL (ref 12.0–15.0)
MCH: 30.5 pg (ref 26.0–34.0)
MCHC: 31.4 g/dL (ref 30.0–36.0)
MCV: 97 fL (ref 80.0–100.0)
Platelets: 159 10*3/uL (ref 150–400)
RBC: 3.94 MIL/uL (ref 3.87–5.11)
RDW: 11.9 % (ref 11.5–15.5)
WBC: 3.8 10*3/uL — ABNORMAL LOW (ref 4.0–10.5)
nRBC: 0 % (ref 0.0–0.2)

## 2021-11-01 LAB — COMPREHENSIVE METABOLIC PANEL
ALT: 12 U/L (ref 0–44)
AST: 17 U/L (ref 15–41)
Albumin: 3.4 g/dL — ABNORMAL LOW (ref 3.5–5.0)
Alkaline Phosphatase: 53 U/L (ref 38–126)
Anion gap: 8 (ref 5–15)
BUN: 19 mg/dL (ref 6–20)
CO2: 23 mmol/L (ref 22–32)
Calcium: 9.1 mg/dL (ref 8.9–10.3)
Chloride: 106 mmol/L (ref 98–111)
Creatinine, Ser: 0.72 mg/dL (ref 0.44–1.00)
GFR, Estimated: >60 mL/min (ref >60)
Glucose, Bld: 86 mg/dL (ref 70–99)
Potassium: 3.5 mmol/L (ref 3.5–5.1)
Sodium: 137 mmol/L (ref 135–145)
Total Bilirubin: <0.1 mg/dL — ABNORMAL LOW (ref 0.3–1.2)
Total Protein: 6.3 g/dL — ABNORMAL LOW (ref 6.5–8.1)

## 2021-11-01 LAB — HCG, QUANTITATIVE, PREGNANCY: hCG, Beta Chain, Quant, S: 18538 m[IU]/mL — ABNORMAL HIGH (ref <5)

## 2021-11-01 NOTE — MAU Provider Note (Signed)
Chief Complaint: Vaginal Bleeding   Event Date/Time   First Provider Initiated Contact with Patient 11/01/21 2151        SUBJECTIVE HPI: Sara Mathis is a 36 y.o. G2P0010 at [redacted]w[redacted]d by LMP who presents to maternity admissions reporting vaginal bleeding.  Has light cramping.  Was seen at [facility]for an [place] yesterday and [facility] IUP at [redacted]w[redacted]d was seen she states they couldn't see a heartbeat.  She denies vaginal itching/burning, urinary symptoms, h/a, dizziness, n/v, or fever/chills.    Vaginal Bleeding The patient's primary symptoms include pelvic pain and vaginal bleeding. The patient's pertinent negatives include no genital itching, genital lesions or genital odor. This is a new problem. The current episode started yesterday. The problem has been unchanged. She is pregnant. Pertinent negatives include no chills, fever, nausea or vomiting. The vaginal discharge was bloody. The vaginal bleeding is lighter than menses. She has not been passing clots. She has not been passing tissue. Nothing aggravates the symptoms. She has tried nothing for the symptoms.   RN Note: Sara Mathis is a 36 y.o. at [redacted]w[redacted]d here in MAU reporting: VB since last night that started off light, brown spotting and is now like a light period that is red blood. Pt has went through 3 pad liners today. Pt reports cramping mostly on her left side. Pt states she found out she was pregnant with home test and went to PCP on 1/31/223 for confirmation and had an [place] 10/29/21. Pt denies Lof, blood clots, and abnormal discharge. 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  Tobacco Use   Smoking status: Never   Smokeless tobacco: Never  Vaping Use   Vaping Use: Never used  Substance and Sexual Activity   Alcohol use: Not Currently    Alcohol/week: 0.0 standard drinks    Comment: whiskey or tequila shot maybe twice monthly   Drug use: No   Sexual activity: Not Currently    Birth control/protection: None  Other Topics Concern   Not on file  Social History Narrative   Living with [name]--just friends-here in [place].   Previous female partner is patient here as well.   Still in contact with [name]'s children (she was parenting them when they were together)   Social Determinants of Health   Financial Resource Strain: Low Risk    Difficulty of Paying Living Expenses: Not hard at all  Food Insecurity: No Food Insecurity   Worried About Charity fundraiser in the Last Year: Never true   Arboriculturist in the Last Year: Never true  Transportation Needs: Not on file  Physical Activity: Not on file  Stress: Not on file  Social Connections: Not on file  Intimate Partner Violence: Not At Risk   Fear of Current or Ex-Partner: No   Emotionally Abused: No  Physically Abused: No   Sexually Abused: No   No current facility-administered medications on file prior to encounter.   Current Outpatient Medications on File Prior to Encounter  Medication Sig Dispense Refill   buPROPion (WELLBUTRIN SR) 150 MG 12 hr tablet 1 tab by mouth daily in morning for 3 days, thereafter 1 tab by mouth twice daily (Patient not taking: Reported on 12/29/2020) 60 tablet 11   calcium citrate-vitamin D 500-400 MG-UNIT chewable tablet 1 tab by mouth twice daily.     cetirizine (ZYRTEC) 10 MG tablet  Take 1 tablet (10 mg total) by mouth daily. 60 tablet 6   levothyroxine (SYNTHROID) 125 MCG tablet TAKE 1 TABLET BY MOUTH EVERY DAY BEFORE BREAKFAST 90 tablet 2   No Known Allergies  I have reviewed patient's Past Medical Hx, Surgical Hx, Family Hx, Social Hx, medications and allergies.   ROS:  Review of Systems  Constitutional:  Negative for chills and fever.  Gastrointestinal:  Negative for nausea and vomiting.  Genitourinary:  Positive for pelvic pain and vaginal bleeding.  Review of Systems  Other systems negative   Physical Exam  Physical Exam Patient Vitals for the past 24 hrs:  BP Temp Temp src Pulse Resp SpO2 Height Weight  11/01/21 2136 119/84 98.5 F (36.9 C) Oral 72 18 99 % 5\' 8"  (1.727 m) 77.2 kg   Constitutional: Well-developed, well-nourished female in no acute distress.  Cardiovascular: normal rate Respiratory: normal effort GI: Abd soft, non-tender. Pos BS x 4 MS: Extremities nontender, no edema, normal ROM Neurologic: Alert and oriented x 4.  GU: Neg CVAT.  PELVIC EXAM: deferred in lieu of Transvaginal Ultrasound. Bleeding is light  LAB RESULTS Results for orders placed or performed during the hospital encounter of 11/01/21 (from the past 24 hour(s))  ABO/Rh     Status: None   Collection Time: 11/01/21  9:08 PM  Result Value Ref Range   ABO/RH(D) O POS    No rh immune globuloin      NOT A RH IMMUNE GLOBULIN CANDIDATE, PT RH POSITIVE Performed at [facility] [address] [place], [place] [address]     --/--/O POS (02/15 2108)  IMAGING US OB LESS THAN 14 WEEKS WITH OB TRANSVAGINAL  Result Date: 11/01/2021 CLINICAL DATA:  Pregnancy and bleeding. LMP: 08/14/2021 corresponding to an estimated gestational age of [redacted] weeks, 2 days. EXAM: OBSTETRIC <14 WK [place] AND TRANSVAGINAL OB US TECHNIQUE: Both transabdominal and transvaginal ultrasound examinations were performed for complete evaluation of the gestation as well as the maternal uterus, adnexal  regions, and pelvic cul-de-sac. Transvaginal technique was performed to assess early pregnancy. COMPARISON:  None. FINDINGS: Intrauterine gestational sac: Single intrauterine gestational sac. Yolk sac:  Seen Embryo:  Present Cardiac Activity: Not detected Heart Rate: Not detected CRL:  3 mm   5 w   6 d                  [place] EDC: 06/28/2022 Subchorionic hemorrhage:  None visualized. Maternal uterus/adnexae: The ovary is unremarkable. There is a corpus luteum in the right ovary. IMPRESSION: Single intrauterine pregnancy with an estimated gestational age of [redacted] weeks, 6 days based on crown-rump length. No embryonic cardiac activity identified. Follow-up with ultrasound in 7-11 days, or earlier if clinically indicated, recommended. Electronically Signed   By: Anner Crete M.D.   On: 11/01/2021 22:33     MAU Management/MDM: Ordered Ultrasound to rule out SAB  This bleeding/pain can represent a normal pregnancy with  bleeding, spontaneous abortion or even an ectopic which can be life-threatening.  The process as listed above helps to determine which of these is present.  Ultrasound findings reviewed:   5 week fetus seen with no cardiac activity.  They recommend repeat US in 7-10 days.   Ordered.   ASSESSMENT 1. Bleeding in early pregnancy   2. Pregnancy of unknown anatomic location     PLAN Discharge home Will repeat  Ultrasound in about 7-10 days   SAB precautions  Pt stable at time of discharge. Encouraged to return here if she develops worsening of symptoms, increase in pain, fever, or other concerning symptoms.    Hansel Feinstein CNM, MSN Certified Nurse-Midwife 11/01/2021  9:51 PM

## 2021-11-01 NOTE — MAU Note (Signed)
..  Sara Mathis is a 36 y.o. at [redacted]w[redacted]d here in MAU reporting: VB since last night that started off light, brown spotting and is now like a light period that is red blood. Pt has went through 3 pad liners today. Pt reports cramping mostly on her left side. Pt states she found out she was pregnant with home test and went to PCP on 1/31/223 for confirmation and had an [place] 10/29/21. Pt denies Lof, blood clots, and abnormal discharge. Last intercourse was yesterday.   EDD 05/21/22 Onset of complaint: 2300 Pain score: 4/10 Vitals:   11/01/21 2136  BP: 119/84  Pulse: 72  Resp: 18  Temp: 98.5 F (36.9 C)  SpO2: 99%     FHT:RN attempted, unable to assess Lab orders placed from triage:  none

## 2021-11-02 ENCOUNTER — Other Ambulatory Visit: Payer: Self-pay

## 2021-11-02 ENCOUNTER — Inpatient Hospital Stay (HOSPITAL_COMMUNITY)
Admission: AD | Admit: 2021-11-02 | Discharge: 2021-11-03 | Disposition: A | Discharge status: Home / Self Care | Payer: Self-pay | Attending: Obstetrics and Gynecology | Admitting: Obstetrics and Gynecology

## 2021-11-02 ENCOUNTER — Encounter (HOSPITAL_COMMUNITY): Payer: Self-pay | Admitting: Obstetrics and Gynecology

## 2021-11-02 DIAGNOSIS — O09521 Supervision of elderly multigravida, first trimester: Secondary | ICD-10-CM | POA: Insufficient documentation

## 2021-11-02 DIAGNOSIS — O200 Threatened abortion: Secondary | ICD-10-CM

## 2021-11-02 DIAGNOSIS — O219 Vomiting of pregnancy, unspecified: Secondary | ICD-10-CM | POA: Insufficient documentation

## 2021-11-02 DIAGNOSIS — Z3A11 11 weeks gestation of pregnancy: Secondary | ICD-10-CM | POA: Insufficient documentation

## 2021-11-02 DIAGNOSIS — O039 Complete or unspecified spontaneous abortion without complication: Secondary | ICD-10-CM | POA: Insufficient documentation

## 2021-11-02 DIAGNOSIS — R102 Pelvic and perineal pain: Secondary | ICD-10-CM | POA: Insufficient documentation

## 2021-11-03 ENCOUNTER — Inpatient Hospital Stay (HOSPITAL_COMMUNITY): Payer: Self-pay

## 2021-11-03 DIAGNOSIS — Z3A11 11 weeks gestation of pregnancy: Secondary | ICD-10-CM

## 2021-11-03 DIAGNOSIS — O039 Complete or unspecified spontaneous abortion without complication: Secondary | ICD-10-CM

## 2021-11-03 LAB — CBC
HCT: 36.5 % (ref 36.0–46.0)
Hemoglobin: 11.7 g/dL — ABNORMAL LOW (ref 12.0–15.0)
MCH: 30.5 pg (ref 26.0–34.0)
MCHC: 32.1 g/dL (ref 30.0–36.0)
MCV: 95.1 fL (ref 80.0–100.0)
Platelets: 142 10*3/uL — ABNORMAL LOW (ref 150–400)
RBC: 3.84 MIL/uL — ABNORMAL LOW (ref 3.87–5.11)
RDW: 11.9 % (ref 11.5–15.5)
WBC: 6.4 10*3/uL (ref 4.0–10.5)
nRBC: 0 % (ref 0.0–0.2)

## 2021-11-03 LAB — SAMPLE TO BLOOD BANK

## 2021-11-03 MED ORDER — TRAMADOL HCL 50 MG PO TABS: 50.0000 mg | ORAL_TABLET | Freq: Four times a day (QID) | ORAL | 0 refills | Status: DC | PRN

## 2021-11-03 MED ORDER — HYDROMORPHONE HCL 1 MG/ML IJ SOLN
1.0000 mg | Freq: Once | INTRAMUSCULAR | Status: AC
  Administered 2021-11-03: 1 mg via INTRAMUSCULAR
  Filled 2021-11-03: qty 1

## 2021-11-03 NOTE — MAU Note (Signed)
PT was seen in MAU yesterday for vag bleeding and abd pain. Symptooms have gotten worse today and pt states she has used 9-10 pads today and pain much worse. Pt actively vomiting in Triage due to pain

## 2021-11-03 NOTE — MAU Provider Note (Signed)
Chief Complaint: Abdominal Pain and Vaginal Bleeding   Event Date/Time   First Provider Initiated Contact with Patient 11/03/21 0047        SUBJECTIVE HPI: Sara Mathis is a 36 y.o. G3P0020 at [redacted]w[redacted]d by LMP who presents to maternity admissions reporting cramping and heavy bleeding.  Was seen by me yesterday for bleeding, and a single IUP without cardiac activity was seen on [place].  Plan was to repeat US in a week. . She denies vaginal bleeding, vaginal itching/burning, urinary symptoms, h/a, dizziness, n/v, or fever/chills.    Abdominal Pain This is a new problem. The current episode started today. The problem occurs intermittently. The problem has been unchanged. The pain is located in the LLQ, RLQ and suprapubic region. The quality of the pain is cramping. The abdominal pain does not radiate. Pertinent negatives include no fever or headaches. Nothing aggravates the pain. The pain is relieved by Nothing. She has tried nothing for the symptoms.  Vaginal Bleeding The patient's primary symptoms include pelvic pain and vaginal bleeding. The patient's pertinent negatives include no genital itching, genital lesions or genital odor. The current episode started today. She is pregnant. Associated symptoms include abdominal pain. Pertinent negatives include no chills, fever or headaches. The vaginal discharge was bloody. The vaginal bleeding is heavier than menses. She has been passing clots. She has not been passing tissue. Nothing aggravates the symptoms. She has tried nothing for the symptoms.   RN note: PT was seen in MAU yesterday for vag bleeding and abd pain. Symptooms have gotten worse today and pt states she has used 9-10 pads today and pain much worse. Pt actively vomiting in Triage due to pain   Past Medical History:  Diagnosis Date   Medical history non-contributory    Overweight (BMI 25.0-29.9) 06/10/2017   Thyroid disease 02/21/2015   Grave's disease resulting in total  thyroidectomy   Past Surgical History:  Procedure Laterality Date   THERAPEUTIC ABORTION  06/2020   At 6 weeks   THYROIDECTOMY Bilateral 02/21/2015   For Grave's Disease not well controlled on medication   Social History   Socioeconomic History   Marital status: Soil scientist    Spouse name: Not on file   Number of children: 2   Years of education: 10   Highest education level: Not on file  Occupational History   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  Tobacco Use   Smoking status: Never   Smokeless tobacco: Never  Vaping Use   Vaping Use: Never used  Substance and Sexual Activity   Alcohol use: Not Currently    Alcohol/week: 0.0 standard drinks    Comment: whiskey or tequila shot maybe twice monthly   Drug use: No   Sexual activity: Not Currently    Birth control/protection: None  Other Topics Concern   Not on file  Social History Narrative   Living with [name]--just friends-here in [place].   Previous female partner is patient here as well.   Still in contact with [name]'s children (she was parenting them when they were together)   Social Determinants of Health   Financial Resource Strain: Low Risk    Difficulty of Paying Living Expenses: Not hard at all  Food Insecurity: No Food Insecurity   Worried About Charity fundraiser in the Last Year: Never true   Ran Out of Food in the Last Year: Never true  Transportation Needs: Not on file  Physical Activity: Not on file  Stress: Not  on file  Social Connections: Not on file  Intimate Partner Violence: Not At Risk   Fear of Current or Ex-Partner: No   Emotionally Abused: No   Physically Abused: No   Sexually Abused: No   No current facility-administered medications on file prior to encounter.   Current Outpatient Medications on File Prior to Encounter  Medication Sig Dispense Refill   calcium citrate-vitamin D 500-400 MG-UNIT chewable tablet 1 tab by mouth twice daily.      levothyroxine (SYNTHROID) 125 MCG tablet TAKE 1 TABLET BY MOUTH EVERY DAY BEFORE BREAKFAST 90 tablet 2   buPROPion (WELLBUTRIN SR) 150 MG 12 hr tablet 1 tab by mouth daily in morning for 3 days, thereafter 1 tab by mouth twice daily 60 tablet 11   cetirizine (ZYRTEC) 10 MG tablet Take 1 tablet (10 mg total) by mouth daily. 60 tablet 6   No Known Allergies  I have reviewed patient's Past Medical Hx, Surgical Hx, Family Hx, Social Hx, medications and allergies.   ROS:  Review of Systems  Constitutional:  Negative for chills and fever.  Gastrointestinal:  Positive for abdominal pain.  Genitourinary:  Positive for pelvic pain and vaginal bleeding.  Neurological:  Negative for headaches.  Review of Systems  Other systems negative   Physical Exam  Physical Exam Patient Vitals for the past 24 hrs:  BP Temp Pulse Resp Height Weight  11/03/21 0003 100/70 -- 80 -- -- --  11/02/21 2359 -- 97.6 F (36.4 C) -- 20 5\' 8"  (1.727 m) 77.2 kg   Constitutional: Well-developed, well-nourished female in no acute distress.  Cardiovascular: normal rate Respiratory: normal effort GI: Abd soft, non-tender. Pos BS x 4 MS: Extremities nontender, no edema, normal ROM Neurologic: Alert and oriented x 4.  GU: Neg CVAT.  PELVIC EXAM: Cervix pink, visually closed, moderate dark blood in vault.  vaginal walls and external genitalia normal  LAB RESULTS Results for orders placed or performed during the hospital encounter of 11/02/21 (from the past 24 hour(s))  CBC     Status: Abnormal   Collection Time: 11/03/21  1:06 AM  Result Value Ref Range   WBC 6.4 4.0 - 10.5 K/uL   RBC 3.84 (L) 3.87 - 5.11 MIL/uL   Hemoglobin 11.7 (L) 12.0 - 15.0 g/dL   HCT 36.5 36.0 - 46.0 %   MCV 95.1 80.0 - 100.0 fL   MCH 30.5 26.0 - 34.0 pg   MCHC 32.1 30.0 - 36.0 g/dL   RDW 11.9 11.5 - 15.5 %   Platelets 142 (L) 150 - 400 K/uL   nRBC 0.0 0.0 - 0.2 %  Sample to Blood Bank     Status: None   Collection Time: 11/03/21  1:06  AM  Result Value Ref Range   Blood Bank Specimen SAMPLE AVAILABLE FOR TESTING    Sample Expiration      11/04/2021,2359 Performed at [facility], [address] [place], [place] [address]    s  --/--/O POS (02/15 2108)  IMAGING US OB Transvaginal  Result Date: 11/03/2021 CLINICAL DATA:  Pregnant, pain, vaginal bleeding EXAM: TRANSVAGINAL OB ULTRASOUND TECHNIQUE: Transvaginal ultrasound was performed for complete evaluation of the gestation as well as the maternal uterus, adnexal regions, and pelvic cul-de-sac. COMPARISON:  11/01/2021 FINDINGS: Intrauterine gestational sac: Single, with angular margins, low lying in the lower uterine segment/cervix Yolk sac:  No longer visualized. Embryo:  No longer visualized MSD: 20.0 mm   6 w   6 d Subchorionic hemorrhage:  None visualized. Maternal uterus/adnexae: Bilateral ovaries are within normal limits. No free fluid. IMPRESSION: Irregular, low lying gestational sac in the lower uterine segment/cervix. Yolk sac and gestational sac are no longer visualized. Findings meet definitive criteria for failed pregnancy. This follows SRU consensus guidelines: Diagnostic Criteria for Nonviable Pregnancy Early in the First Trimester. Alison Stalling J Med [phone]. Electronically Signed   By: Julian Hy M.D.   On: 11/03/2021 02:05     MAU Management/MDM: Ordered CBC to assess for amount of blood loss   .   Will check Ultrasound to rule out SAB  >> confirms SAB  This bleeding/pain can represent a normal pregnancy with bleeding, spontaneous abortion which can be life-threatening.  The process as listed above helps to determine which of these is present.   ASSESSMENT Single IUP at [redacted]w[redacted]d  Spontaneous abortion  PLAN Discharge home Rx Tramadol and ibuprofen for pain  Bleeding precautions Message sent to office to arrange followup   Pt stable at time of discharge. Encouraged to return here if she develops worsening of symptoms, increase in pain,  fever, or other concerning symptoms.    Hansel Feinstein CNM, MSN Certified Nurse-Midwife 11/03/2021  12:47 AM

## 2021-11-03 NOTE — MAU Note (Signed)
Pt is having abdominal cramping about every 3 mins and states it feels like ctxs. Carmelia Roller CNM aware and will see pt

## 2021-11-30 ENCOUNTER — Encounter: Payer: Self-pay | Admitting: Certified Nurse Midwife

## 2022-01-04 ENCOUNTER — Encounter: Payer: Self-pay | Admitting: Internal Medicine

## 2022-03-16 ENCOUNTER — Encounter: Payer: Self-pay | Admitting: Internal Medicine

## 2022-05-01 ENCOUNTER — Emergency Department (HOSPITAL_COMMUNITY)
Admission: EM | Admit: 2022-05-01 | Discharge: 2022-05-02 | Disposition: A | Discharge status: Other Acute Inpatient Hospital | Payer: Self-pay | Attending: Emergency Medicine | Admitting: Emergency Medicine

## 2022-05-01 ENCOUNTER — Encounter (HOSPITAL_COMMUNITY): Payer: Self-pay | Admitting: Emergency Medicine

## 2022-05-01 ENCOUNTER — Other Ambulatory Visit: Payer: Self-pay

## 2022-05-01 DIAGNOSIS — G44209 Tension-type headache, unspecified, not intractable: Secondary | ICD-10-CM | POA: Insufficient documentation

## 2022-05-01 DIAGNOSIS — R112 Nausea with vomiting, unspecified: Secondary | ICD-10-CM | POA: Insufficient documentation

## 2022-05-01 DIAGNOSIS — Z79899 Other long term (current) drug therapy: Secondary | ICD-10-CM | POA: Insufficient documentation

## 2022-05-01 LAB — I-STAT BETA HCG BLOOD, ED (MC, WL, AP ONLY): I-stat hCG, quantitative: <5 m[IU]/mL (ref <5)

## 2022-05-01 LAB — CBG MONITORING, ED: Glucose-Capillary: 104 mg/dL — ABNORMAL HIGH (ref 70–99)

## 2022-05-01 MED ORDER — ONDANSETRON 4 MG PO TBDP
4.0000 mg | ORAL_TABLET | Freq: Once | ORAL | Status: AC
  Administered 2022-05-02: 4 mg via ORAL
  Filled 2022-05-01: qty 1

## 2022-05-01 MED ORDER — ACETAMINOPHEN 500 MG PO TABS
1000.0000 mg | ORAL_TABLET | Freq: Once | ORAL | Status: AC
  Administered 2022-05-02: 1000 mg via ORAL
  Filled 2022-05-01: qty 2

## 2022-05-01 NOTE — ED Provider Triage Note (Signed)
Emergency Medicine Provider Triage Evaluation Note  Joen Laura , a 36 y.o. female  was evaluated in triage.  Pt complains of headache intermittent for several weeks but now persistent for the last 5 days across the forehead with associated nausea and NBNB emesis for 48 hours.   Review of Systems  Positive: Headache, nausea, vomiting, blurry vision Negative: Fevers, chills, immunocompromising disease  Physical Exam  BP 124/79   Pulse 87   Temp 98.7 F (37.1 C) (Oral)   Resp 18   Wt 81.6 kg   LMP 05/31/2021 Comment: regular  SpO2 98%   Breastfeeding Unknown   BMI 27.37 kg/m  Gen:   Awake, no distress   Resp:  Normal effort  MSK:   Moves extremities without difficulty  Other:  PERRL, EOMI, AAOx3, no focal deficit on brief neurologic exam  Medical Decision Making  Medically screening exam initiated at 10:34 PM.  Appropriate orders placed.  [name] was informed that the remainder of the evaluation will be completed by another provider, this initial triage assessment does not replace that evaluation, and the importance of remaining in the ED until their evaluation is complete.  This chart was dictated using voice recognition software, Dragon. Despite the best efforts of this provider to proofread and correct errors, errors may still occur which can change documentation meaning.    Emeline Darling, PA-C 05/01/22 2246

## 2022-05-01 NOTE — ED Triage Notes (Signed)
Pt in with R frontal headache x 5 days, also reporting n/v for same. Reporting photophobia, blurred vision.

## 2022-05-02 ENCOUNTER — Emergency Department (HOSPITAL_COMMUNITY): Payer: Self-pay

## 2022-05-02 LAB — CBC WITH DIFFERENTIAL/PLATELET
Abs Immature Granulocytes: 0.02 10*3/uL (ref 0.00–0.07)
Basophils Absolute: 0 10*3/uL (ref 0.0–0.1)
Basophils Relative: 0 %
Eosinophils Absolute: 0 10*3/uL (ref 0.0–0.5)
Eosinophils Relative: 0 %
HCT: 40.2 % (ref 36.0–46.0)
Hemoglobin: 13.2 g/dL (ref 12.0–15.0)
Immature Granulocytes: 0 %
Lymphocytes Relative: 14 %
Lymphs Abs: 1.2 10*3/uL (ref 0.7–4.0)
MCH: 30.8 pg (ref 26.0–34.0)
MCHC: 32.8 g/dL (ref 30.0–36.0)
MCV: 93.7 fL (ref 80.0–100.0)
Monocytes Absolute: 0.7 10*3/uL (ref 0.1–1.0)
Monocytes Relative: 8 %
Neutro Abs: 6.8 10*3/uL (ref 1.7–7.7)
Neutrophils Relative %: 78 %
Platelets: 183 10*3/uL (ref 150–400)
RBC: 4.29 MIL/uL (ref 3.87–5.11)
RDW: 12.4 % (ref 11.5–15.5)
WBC: 8.7 10*3/uL (ref 4.0–10.5)
nRBC: 0 % (ref 0.0–0.2)

## 2022-05-02 LAB — BASIC METABOLIC PANEL
Anion gap: 7 (ref 5–15)
BUN: 17 mg/dL (ref 6–20)
CO2: 26 mmol/L (ref 22–32)
Calcium: 9.3 mg/dL (ref 8.9–10.3)
Chloride: 104 mmol/L (ref 98–111)
Creatinine, Ser: 0.74 mg/dL (ref 0.44–1.00)
GFR, Estimated: >60 mL/min (ref >60)
Glucose, Bld: 109 mg/dL — ABNORMAL HIGH (ref 70–99)
Potassium: 3.9 mmol/L (ref 3.5–5.1)
Sodium: 137 mmol/L (ref 135–145)

## 2022-05-02 MED ORDER — DIPHENHYDRAMINE HCL 50 MG/ML IJ SOLN
25.0000 mg | Freq: Once | INTRAMUSCULAR | Status: AC
  Administered 2022-05-02: 25 mg via INTRAVENOUS
  Filled 2022-05-02: qty 1

## 2022-05-02 MED ORDER — PROCHLORPERAZINE EDISYLATE 10 MG/2ML IJ SOLN
10.0000 mg | Freq: Once | INTRAMUSCULAR | Status: AC
  Administered 2022-05-02: 10 mg via INTRAVENOUS
  Filled 2022-05-02: qty 2

## 2022-05-02 MED ORDER — KETOROLAC TROMETHAMINE 15 MG/ML IJ SOLN
15.0000 mg | Freq: Once | INTRAMUSCULAR | Status: AC
  Administered 2022-05-02: 15 mg via INTRAVENOUS
  Filled 2022-05-02: qty 1

## 2022-05-02 MED ORDER — DEXAMETHASONE SODIUM PHOSPHATE 10 MG/ML IJ SOLN
10.0000 mg | Freq: Once | INTRAMUSCULAR | Status: AC
  Administered 2022-05-02: 10 mg via INTRAVENOUS
  Filled 2022-05-02: qty 1

## 2022-05-02 MED ORDER — SODIUM CHLORIDE 0.9 % IV BOLUS
1000.0000 mL | Freq: Once | INTRAVENOUS | Status: AC
  Administered 2022-05-02: 1000 mL via INTRAVENOUS

## 2022-05-02 NOTE — ED Provider Notes (Signed)
[facility] EMERGENCY DEPARTMENT Provider Note   CSN: [id number] Arrival date & time: 05/01/22  2137     History  Chief Complaint  Patient presents with   Headache    Sara Mathis is a 36 y.o. female.  Patient with no pertinent past medical history presents today with complaints of headache, nausea, and vomiting. She states that she has had a history of several headaches in the past that were similar in nature to this one but normally just self resolve. She states that this time her headache has not resolved and has been constant for the past 5 days. She states that she has been attempting to manage with OTC pain control without relief. States that her headache is frontal in nature and is without associated blurred vision or dizziness. She states that 2 days ago she began having nausea and vomiting and was unable to tolerate oral intake which was concerning for her and presented for same. She endorses some photophobia and phonophobia. Upon my assessment after patient has been waiting to be seen for about 16 hours, patient states that her last episode of vomiting was in the night last night when she was in the lobby. She states she was able to sleep some in the lobby and when she woke up she was able to eating some breakfast from the vending machine and keep it down. States that she is still slightly nauseous. States her headache is still present but has improved.  The history is provided by the patient. A language interpreter was used.  Headache      Home Medications Prior to Admission medications   Medication Sig Start Date End Date Taking? Authorizing Provider  buPROPion [facility] SR) 150 MG 12 hr tablet 1 tab by mouth daily in morning for 3 days, thereafter 1 tab by mouth twice daily 02/25/20   [name], MD  calcium citrate-vitamin D 500-400 MG-UNIT chewable tablet 1 tab by mouth twice daily. 12/29/20   [name], MD  cetirizine  (ZYRTEC) 10 MG tablet Take 1 tablet (10 mg total) by mouth daily. 05/15/19   [name], MD  levothyroxine (SYNTHROID) 125 MCG tablet TAKE 1 TABLET BY MOUTH EVERY DAY BEFORE BREAKFAST 04/13/21   [name], MD  traMADol (ULTRAM) 50 MG tablet Take 1 tablet (50 mg total) by mouth every 6 (six) hours as needed. 11/03/21   Seabron Spates, CNM      Allergies    Patient has no known allergies.    Review of Systems   Review of Systems  Neurological:  Positive for headaches.  All other systems reviewed and are negative.   Physical Exam Updated Vital Signs BP 97/71   Pulse 78   Temp 98 F (36.7 C)   Resp 15   Wt 81.6 kg   LMP 05/31/2021 Comment: regular  SpO2 99%   Breastfeeding Unknown   BMI 27.37 kg/m  Physical Exam Vitals and nursing note reviewed.  Constitutional:      General: She is not in acute distress.    Appearance: Normal appearance. She is well-developed and normal weight. She is not ill-appearing, toxic-appearing or diaphoretic.  HENT:     Head: Normocephalic and atraumatic.  Eyes:     Extraocular Movements: Extraocular movements intact.     Pupils: Pupils are equal, round, and reactive to light.  Neck:     Comments: No meningismus Cardiovascular:     Rate and Rhythm: Normal rate and regular rhythm.  Heart sounds: Normal heart sounds.  Pulmonary:     Effort: Pulmonary effort is normal. No respiratory distress.     Breath sounds: Normal breath sounds.  Abdominal:     Palpations: Abdomen is soft.  Musculoskeletal:        General: Normal range of motion.     Cervical back: Normal range of motion and neck supple.  Skin:    General: Skin is warm and dry.  Neurological:     General: No focal deficit present.     Mental Status: She is alert and oriented to person, place, and time.     GCS: GCS eye subscore is 4. GCS verbal subscore is 5. GCS motor subscore is 6.     Gait: Gait normal.  Psychiatric:        Mood and Affect: Mood normal.         Behavior: Behavior normal.     ED Results / Procedures / Treatments   Labs (all labs ordered are listed, but only abnormal results are displayed) Labs Reviewed  BASIC METABOLIC PANEL - Abnormal; Notable for the following components:      Result Value   Glucose, Bld 109 (*)    All other components within normal limits  CBG MONITORING, ED - Abnormal; Notable for the following components:   Glucose-Capillary 104 (*)    All other components within normal limits  CBC WITH DIFFERENTIAL/PLATELET  I-STAT BETA HCG BLOOD, ED ([facility], [facility], [facility] ONLY)    EKG None  Radiology CT HEAD WO CONTRAST  Result Date: 05/02/2022 CLINICAL DATA:  Headache. EXAM: CT HEAD WITHOUT CONTRAST TECHNIQUE: Contiguous axial images were obtained from the base of the skull through the vertex without intravenous contrast. RADIATION DOSE REDUCTION: This exam was performed according to the departmental dose-optimization program which includes automated exposure control, adjustment of the mA and/or kV according to patient size and/or use of iterative reconstruction technique. COMPARISON:  None Available. FINDINGS: Brain: No evidence of acute infarction, hemorrhage, hydrocephalus, extra-axial collection or mass lesion/mass effect. Vascular: No hyperdense vessel or unexpected calcification. Skull: Normal. Negative for fracture or focal lesion. Sinuses/Orbits: No acute finding. Other: None. IMPRESSION: No acute intracranial pathology. Electronically Signed   By: Virgina Norfolk M.D.   On: 05/02/2022 01:20    Procedures Procedures    Medications Ordered in ED Medications  ondansetron (ZOFRAN-ODT) disintegrating tablet 4 mg (has no administration in time range)  acetaminophen (TYLENOL) tablet 1,000 mg (has no administration in time range)  sodium chloride 0.9 % bolus 1,000 mL (has no administration in time range)  prochlorperazine (COMPAZINE) injection 10 mg (has no administration in time range)  diphenhydrAMINE (BENADRYL)  injection 25 mg (has no administration in time range)  ketorolac (TORADOL) 15 MG/ML injection 15 mg (has no administration in time range)  dexamethasone (DECADRON) injection 10 mg (has no administration in time range)    ED Course/ Medical Decision Making/ A&P                           Medical Decision Making Risk Prescription drug management.   This patient presents to the ED for concern of headache, this involves an extensive number of treatment options, and is a complaint that carries with it a high risk of complications and morbidity.  The differential diagnosis includes CVA, neoplasm, migraine, pregnancy. This is not an exhaustive differential   Co morbidities that complicate the patient evaluation  none   Lab Tests:  I  Ordered, and personally interpreted labs.  The pertinent results include:  no acute laboratory findings   Imaging Studies ordered:  I ordered imaging studies including CT head   I independently visualized and interpreted imaging which showed no acute findings I agree with the radiologist interpretation   Problem List / ED Course / Critical interventions / Medication management  Headache I ordered medication including toradol, decadron, compazine, benadryl, and fluids  for migraine  Reevaluation of the patient after these medicines showed that the patient resolved I have reviewed the patients home medicines and have made adjustments as needed   Test / Admission - Considered:  Sara Mathis presents with headache x 5 days with 8 hours of emesis  After evaluating all of the data points in this case, the presentation of Sara Mathis is NOT consistent with skull fracture, meningitis/encephalitis, SAH/sentinel bleed, Intracranial Hemorrhage (ICH) (subdural/epidural), acute obstructive hydrocephalus, space occupying lesions, CVA, Basilar/vertebral artery dissection, preeclampsia, cerebral venous thrombosis, hypertensive  emergency, or temporal Arteritis  Patient is afebrile, non-toxic appearing, and in no acute distress with reassuring vital signs. She is also alert and oriented and neurologically intact without focal deficits. Upon my assessment after patient waited in the lobby 16 hours for a room, she is no longer vomiting and is able to tolerate po intake without issue. She endorses a mild headache that has persisted, will give headache cocktail and reassess.   Upon reassessment after headache cocktail, patient states that her symptoms have completely resolved and she feels ready to go home. Suspect that she had a tension headache from stress and dehydration, patient educated to same and in agreement. She is stable for discharge at this time, educated on red flag symptoms that would prompt immediate return. She is understanding and amenable with plan, discharged in stable condition.   Strict return and follow-up precautions have been given by me personally or by detailed written instructions verbalized by nursing staff using the teach back method to patient/family/caregiver.  Data Reviewed/Counseling: I have reviewed the patient's vital signs, nursing notes, and other relevant tests/information. I had a detailed discussion regarding the historical points, exam findings, and any diagnostic results supporting the discharge diagnosis. I also discussed the need for outpatient follow-up and the need to return to the ED if symptoms worsen or if there are any questions or concerns that arise at home   Final Clinical Impression(s) / ED Diagnoses Final diagnoses:  Acute non intractable tension-type headache    Rx / DC Orders ED Discharge Orders     None     An After Visit Summary was printed and given to the patient.     Bud Face, PA-C 26/33/35 [id number]    Gray, Delphos P, DO 56/38/93 [phone]

## 2022-05-02 NOTE — Discharge Instructions (Signed)
Please make sure that you reduce your stress, get plenty of rest, and drink plenty of water as these are the most common causes of headaches. Follow-up with your PCP as needed for continued evaluation and management of your symptoms.   Return if development of any new or worsening symptoms  Asegrese de reducir su estrs, descansar lo suficiente y beber mucha agua, ya que estas son las causas ms comunes de los dolores de [place]. Haga un seguimiento con su PCP segn sea necesario para continuar con la evaluacin y el control de sus sntomas.  Regresar si se desarrollan sntomas nuevos o que empeoran

## 2022-06-14 ENCOUNTER — Ambulatory Visit: Payer: Self-pay | Admitting: Internal Medicine

## 2022-06-14 ENCOUNTER — Encounter: Payer: Self-pay | Admitting: Internal Medicine

## 2022-06-14 VITALS — BP 116/80 | HR 68 | Resp 16 | Ht 66.25 in | Wt 175.0 lb

## 2022-06-14 DIAGNOSIS — R519 Headache, unspecified: Secondary | ICD-10-CM

## 2022-06-14 DIAGNOSIS — E890 Postprocedural hypothyroidism: Secondary | ICD-10-CM

## 2022-06-14 DIAGNOSIS — F439 Reaction to severe stress, unspecified: Secondary | ICD-10-CM

## 2022-06-14 DIAGNOSIS — M542 Cervicalgia: Secondary | ICD-10-CM

## 2022-06-14 MED ORDER — SUMATRIPTAN SUCCINATE 50 MG PO TABS: ORAL_TABLET | ORAL | 2 refills | Status: DC

## 2022-06-14 MED ORDER — LEVOTHYROXINE SODIUM 125 MCG PO TABS: ORAL_TABLET | ORAL | 3 refills | Status: DC

## 2022-06-14 NOTE — Progress Notes (Signed)
Subjective:    Patient ID: Sara Mathis, female   DOB: [birth year], 36 y.o.   MRN: [id number]   HPI  Karen Kays interprets   Headaches:  Has had mild headaches with different character in the past.  Starting in August, began having pounding headache either in bilateral frontal area or just on left or right frontal area.  Associated with dizziness, photophobia, phonophobia, nausea and vomiting.  Headache over posterior neck and occiput starts after the frontal headache pain at times.  No focal numbness, tingling, weakness of her face/extremities.   At times, does note lights in her vision before the headache occurs.  Also gets nauseated before the headache.   Had a headache on and off for 2 days before went into [facility] ED on 05/01/22.  Was unable to keep any pain reliever down with the headache and when vomiting continued--went to ED.   Normal CT scan of head on 05/02/22 HCG negative.  BMP and CBC fine.  Was diagnosed with tension type headache.  Given Dexamethasone, antiemetics, Toradol with relief.    Headaches recurred and were on and off since ED visit.  Bad headache with similar symptoms occurred after long travel in car and nap.  Headache and vomiting were unrelenting, so went to [facility] ED 06/08/2022  Martin Majestic there as shorter wait.  HCG, CBC, CmP UA all unremarkable/negative. Was given Dexamethasone, Toradol and antiemetics again. She was not given any prescription for the headache pain if recurred.    Also, concerned as she has not had a period since July.  2 HCG checks in past 2 months negative.  Has not had a consistent prescription for her Levothyroxine 125 mcg--should have run out in July.  Missed her medication during a trip as left at home--7 days.  With headache and vomiting, she has not been able to take as well.  Suspect she was stretching Rx out since July as should have been out.  + constipation, dry skin and hair, mild weight gain.  Social:   Living in [place] now.  Has not worked since February.   Lost an unplanned pregnancy in Feb Does have a female partner now, who is supportive and has been financially supporting her since she has not been able to work due to [employer].     Should have been out of levothyroxine since July as above. Lot of family and financial issues. Not sure if she is depressed.  Was treated in 2021 from 12/2019 until at least 06/2020 for depression, and seemed to help, but was not longer taking with CPE in 12/2020.  She cannot recall why she stopped.    Current Meds  Medication Sig   calcium citrate-vitamin D 500-400 MG-UNIT chewable tablet 1 tab by mouth twice daily.   cetirizine (ZYRTEC) 10 MG tablet Take 1 tablet (10 mg total) by mouth daily.   ibuprofen (ADVIL) 600 MG tablet Take 600 mg by mouth every 6 (six) hours as needed.   levothyroxine (SYNTHROID) 125 MCG tablet TAKE 1 TABLET BY MOUTH EVERY DAY BEFORE BREAKFAST   No Known Allergies   Review of Systems    Objective:   BP 116/80 (BP Location: Right Arm, Patient Position: Sitting, Cuff Size: Normal)   Pulse 68   Resp 16   Ht 5' 6.25" (1.683 m)   Wt 175 lb (79.4 kg)   LMP 04/05/2021 (Within Days) Comment: regular  BMI 28.03 kg/m   Physical Exam Hair dry and sticking out in  crazy halo around her face with posterior bed head, but otherwise with good hygiene and clothing neat. HEENT:  PERRL, EOMI discs sharp Neck:  Supple, No adenopathy.  Tender over traps to nuchal ridge bilaterally. Chest:  CTA CV:  RRR without murmur or rub.  Radial pulses normal and equal LE:  No edema Neuro: A & O x 3, CN II-XII grossly intact Motor 5/5, DTRs 2+/4 throughout.  Gait normal.   Assessment & Plan    Tension and likely migrainous headaches:  referral to [facility] to work on neck and upper back. Sumatriptan for throbbing headaches associated with photophobia, N &  V Needs to work on stress.    2.  Stress/possible depression:  warm hand off  to [employer], SW.  3.  Hypothyroidism:  TSH and fill levothyroxine.  Keep CPE end of October

## 2022-06-15 LAB — TSH: TSH: 2.48 u[IU]/mL (ref 0.450–4.500)

## 2022-06-19 ENCOUNTER — Ambulatory Visit: Payer: Self-pay | Admitting: Internal Medicine

## 2022-07-17 ENCOUNTER — Encounter: Payer: Self-pay | Admitting: Internal Medicine

## 2022-07-17 ENCOUNTER — Ambulatory Visit: Payer: Self-pay | Admitting: Internal Medicine

## 2022-07-17 VITALS — BP 110/80 | HR 88 | Resp 16 | Ht 66.0 in | Wt 170.0 lb

## 2022-07-17 DIAGNOSIS — N912 Amenorrhea, unspecified: Secondary | ICD-10-CM

## 2022-07-17 DIAGNOSIS — G8929 Other chronic pain: Secondary | ICD-10-CM

## 2022-07-17 DIAGNOSIS — R519 Headache, unspecified: Secondary | ICD-10-CM

## 2022-07-17 LAB — POCT URINE PREGNANCY: Preg Test, Ur: NEGATIVE

## 2022-07-17 MED ORDER — ONDANSETRON HCL 4 MG PO TABS: ORAL_TABLET | ORAL | 0 refills | Status: DC

## 2022-07-17 MED ORDER — TOPIRAMATE 25 MG PO TABS: ORAL_TABLET | ORAL | 6 refills | Status: DC

## 2022-07-17 NOTE — Progress Notes (Signed)
Subjective:    Patient ID: Sara Mathis, female   DOB: 1986/06/17, 36 y.o.   MRN: [id number]   HPI  Was to have CPE without pap--ultimately changed to acute visit due to headache issues.  1.  Pap:  Last 12/29/2020 and normal.    2.  Mammogram:  Never.  No family history of breast cancer.    3.  Osteoprevention:  Not drinking milk very often.  Willing to drink almond milk 3 times daily.  Not very physically active for past few months.    4.  Guaiac Cards/FIT:  never.    5.  Colonoscopy:  Never.  No family history of colon cancer.    6.  Immunizations:  No influenza or 2023 COVID vaccine yet.  Not interested in vaccines today.  Does not feel well.   Immunization History  Administered Date(s) Administered   Influenza,inj,Quad PF,6+ Mos 11/18/2015   Moderna Sars-Covid-2 Vaccination 01/18/2020, 02/16/2020, 09/19/2020   Tdap 06/10/2017     7.  Glucose/Cholesterol :  Glucose at 109 in August, nonfasting.  Cholesterol was fine last year. Lipid Panel     Component Value Date/Time   CHOL 174 02/27/2021 0941   TRIG 83 02/27/2021 0941   HDL 59 02/27/2021 0941   LDLCALC 100 (H) 02/27/2021 0941   LABVLDL 15 02/27/2021 0941   8.  Other:   Headaches:   Tried the Sumatriptan 50 mg and repeated in 2 hours with no improvement in migraine headache pain.  Went up to dose of 100 mg, but no relief as well.  She has not heard from physical therapy as of yet.   Went to a clinic on [address] on a Saturday. Was given Ketorolac 10 mg for 20 doses, which she took over a 5 day span and Ondansetron.  Ketorolac really helped, but headache returned once she was no longer taking.  Bitemporal headache pain--can be on one side or both.   Describes as a squeezing of head.  Can have pounding in her head as well.   Has had for 3-4 days--seems like all the time.   Using Ibuprofen 800 mg every 6 hours currently.  Not much help and makes her vomit. Has been using Ondansetron, which  helps. Has photophobia, nausea and vomiting and some phonophobia with the headaches.   CT scan of head in August was normal.  Taking Cyclobenzaprine 5 mg every night.   Sleeping maybe 4 hours and then up due to the headache.   Feels this may be related to her not having a period since July--very mild then Has not felt even like she would have a period since July.   Last home pregnancy test was yesterday and negative.   Last unprotected sex was 4 days ago.   Has been pregnant twice in last 2 years.  One TAB and one SAB.   TSH recently in normal range with replacement therapy.    Has never utilized BCPs.   No family history of clotting abnormalities.  No tobacco use.      Current Meds  Medication Sig   calcium citrate-vitamin D 500-400 MG-UNIT chewable tablet 1 tab by mouth twice daily.   cyclobenzaprine (FLEXERIL) 5 MG tablet Take 5 mg by mouth at bedtime as needed.   ibuprofen (ADVIL) 600 MG tablet Take 600 mg by mouth every 6 (six) hours as needed.   ketorolac (TORADOL) 10 MG tablet Take 10 mg by mouth every 6 (six) hours as needed.  levothyroxine (SYNTHROID) 125 MCG tablet TAKE 1 TABLET BY MOUTH EVERY DAY BEFORE BREAKFAST   ondansetron (ZOFRAN) 4 MG tablet Take 4 mg by mouth every 8 (eight) hours as needed for nausea or vomiting.   No Known Allergies   Review of Systems  Neurological:                Objective:   BP 110/80 (BP Location: Right Arm, Patient Position: Sitting, Cuff Size: Normal)   Pulse 88   Resp 16   Ht '5\' 6"'$  (1.676 m)   Wt 170 lb (77.1 kg)   LMP 04/05/2021 (Approximate) Comment: regular  BMI 27.44 kg/m   Physical Exam Appears fatigued and mildly uncomfortable HEENT:  PERRL, EOMI, discs sharp, TMs pearly gray.  NT over frontal and maxillary sinuses Neck:  tender over traps and paracervical, upper parathoracic spinal musculature, but neck otherwise supple.  No cervical adenopathy Chest:  CTA CV:  RRR with normal S1, and S2, No S3, S4 or murmur.   Carotid, radial and DP pulses normal and equal Abd: S, NT, No HSM or mass, + BS LE:  No edema. Neuro:  A & O x 3, CN II-XII grossly intact Motor 5/5, DTRs 2+/4 throughout.  Gait normal.   Assessment & Plan   What appears to be combination of muscle tension and migrainous headaches.  Still waiting on PT to get started for muscle tension portion. Start Topamax if serum quant HCG is negative.   Ondansetron for nausea and vomiting.  2.  Amenorrhea since July with 2 pregnancies in past 2 years.   Consider BCPs or 10 day course of medroxy-progesterone if HCG quant negative  3.  Hypothyroidism:  TSH surprisingly in good range when checked last month after missing  hormone on and off.

## 2022-07-18 DIAGNOSIS — D497 Neoplasm of unspecified behavior of endocrine glands and other parts of nervous system: Secondary | ICD-10-CM

## 2022-07-18 HISTORY — DX: Neoplasm of unspecified behavior of endocrine glands and other parts of nervous system: D49.7

## 2022-07-18 LAB — BETA HCG QUANT (REF LAB): hCG Quant: <1 m[IU]/mL

## 2022-07-19 ENCOUNTER — Other Ambulatory Visit: Payer: Self-pay

## 2022-07-19 ENCOUNTER — Emergency Department (HOSPITAL_COMMUNITY): Payer: Self-pay

## 2022-07-19 ENCOUNTER — Inpatient Hospital Stay (HOSPITAL_COMMUNITY)
Admission: EM | Admit: 2022-07-19 | Discharge: 2022-07-24 | DRG: 643 | Disposition: A | Discharge status: Home / Self Care | Payer: Self-pay | Attending: Internal Medicine | Admitting: Internal Medicine

## 2022-07-19 DIAGNOSIS — E0500 Thyrotoxicosis with diffuse goiter without thyrotoxic crisis or storm: Secondary | ICD-10-CM | POA: Diagnosis present

## 2022-07-19 DIAGNOSIS — E890 Postprocedural hypothyroidism: Secondary | ICD-10-CM | POA: Diagnosis present

## 2022-07-19 DIAGNOSIS — E871 Hypo-osmolality and hyponatremia: Secondary | ICD-10-CM | POA: Diagnosis present

## 2022-07-19 DIAGNOSIS — Z79899 Other long term (current) drug therapy: Secondary | ICD-10-CM

## 2022-07-19 DIAGNOSIS — E236 Other disorders of pituitary gland: Secondary | ICD-10-CM | POA: Diagnosis present

## 2022-07-19 DIAGNOSIS — G9341 Metabolic encephalopathy: Secondary | ICD-10-CM | POA: Diagnosis present

## 2022-07-19 DIAGNOSIS — Z1152 Encounter for screening for COVID-19: Secondary | ICD-10-CM

## 2022-07-19 DIAGNOSIS — E876 Hypokalemia: Secondary | ICD-10-CM | POA: Diagnosis present

## 2022-07-19 DIAGNOSIS — G039 Meningitis, unspecified: Secondary | ICD-10-CM

## 2022-07-19 DIAGNOSIS — Z8249 Family history of ischemic heart disease and other diseases of the circulatory system: Secondary | ICD-10-CM

## 2022-07-19 DIAGNOSIS — E870 Hyperosmolality and hypernatremia: Secondary | ICD-10-CM | POA: Diagnosis present

## 2022-07-19 DIAGNOSIS — R350 Frequency of micturition: Secondary | ICD-10-CM | POA: Diagnosis present

## 2022-07-19 DIAGNOSIS — Z818 Family history of other mental and behavioral disorders: Secondary | ICD-10-CM

## 2022-07-19 DIAGNOSIS — R6520 Severe sepsis without septic shock: Secondary | ICD-10-CM | POA: Diagnosis present

## 2022-07-19 DIAGNOSIS — Z79890 Hormone replacement therapy (postmenopausal): Secondary | ICD-10-CM

## 2022-07-19 DIAGNOSIS — E230 Hypopituitarism: Principal | ICD-10-CM | POA: Diagnosis present

## 2022-07-19 DIAGNOSIS — Z833 Family history of diabetes mellitus: Secondary | ICD-10-CM

## 2022-07-19 DIAGNOSIS — H532 Diplopia: Secondary | ICD-10-CM | POA: Diagnosis present

## 2022-07-19 DIAGNOSIS — G9340 Encephalopathy, unspecified: Principal | ICD-10-CM

## 2022-07-19 DIAGNOSIS — E872 Acidosis, unspecified: Secondary | ICD-10-CM | POA: Diagnosis present

## 2022-07-19 DIAGNOSIS — A419 Sepsis, unspecified organism: Principal | ICD-10-CM | POA: Diagnosis present

## 2022-07-19 LAB — COMPREHENSIVE METABOLIC PANEL
ALT: 21 U/L (ref 0–44)
AST: 29 U/L (ref 15–41)
Albumin: 3.3 g/dL — ABNORMAL LOW (ref 3.5–5.0)
Alkaline Phosphatase: 48 U/L (ref 38–126)
Anion gap: 18 — ABNORMAL HIGH (ref 5–15)
BUN: 12 mg/dL (ref 6–20)
CO2: 16 mmol/L — ABNORMAL LOW (ref 22–32)
Calcium: 9.4 mg/dL (ref 8.9–10.3)
Chloride: 101 mmol/L (ref 98–111)
Creatinine, Ser: 0.94 mg/dL (ref 0.44–1.00)
GFR, Estimated: >60 mL/min (ref >60)
Glucose, Bld: 94 mg/dL (ref 70–99)
Potassium: 4.5 mmol/L (ref 3.5–5.1)
Sodium: 135 mmol/L (ref 135–145)
Total Bilirubin: 2 mg/dL — ABNORMAL HIGH (ref 0.3–1.2)
Total Protein: 7.3 g/dL (ref 6.5–8.1)

## 2022-07-19 LAB — CK: Total CK: 85 U/L (ref 38–234)

## 2022-07-19 LAB — CBC WITH DIFFERENTIAL/PLATELET
Abs Immature Granulocytes: 0.02 10*3/uL (ref 0.00–0.07)
Basophils Absolute: 0 10*3/uL (ref 0.0–0.1)
Basophils Relative: 0 %
Eosinophils Absolute: 0 10*3/uL (ref 0.0–0.5)
Eosinophils Relative: 0 %
HCT: 44.7 % (ref 36.0–46.0)
Hemoglobin: 14.9 g/dL (ref 12.0–15.0)
Immature Granulocytes: 0 %
Lymphocytes Relative: 27 %
Lymphs Abs: 2.2 10*3/uL (ref 0.7–4.0)
MCH: 30.1 pg (ref 26.0–34.0)
MCHC: 33.3 g/dL (ref 30.0–36.0)
MCV: 90.3 fL (ref 80.0–100.0)
Monocytes Absolute: 0.7 10*3/uL (ref 0.1–1.0)
Monocytes Relative: 9 %
Neutro Abs: 5.1 10*3/uL (ref 1.7–7.7)
Neutrophils Relative %: 64 %
Platelets: 198 10*3/uL (ref 150–400)
RBC: 4.95 MIL/uL (ref 3.87–5.11)
RDW: 11.6 % (ref 11.5–15.5)
WBC: 8.1 10*3/uL (ref 4.0–10.5)
nRBC: 0 % (ref 0.0–0.2)

## 2022-07-19 LAB — SALICYLATE LEVEL: Salicylate Lvl: <7 mg/dL — ABNORMAL LOW (ref 7.0–30.0)

## 2022-07-19 LAB — RESP PANEL BY RT-PCR (FLU A&B, COVID) ARPGX2
Influenza A by PCR: NEGATIVE
Influenza B by PCR: NEGATIVE
SARS Coronavirus 2 by RT PCR: NEGATIVE

## 2022-07-19 LAB — ETHANOL: Alcohol, Ethyl (B): <10 mg/dL (ref <10)

## 2022-07-19 LAB — CSF CELL COUNT WITH DIFFERENTIAL
Eosinophils, CSF: 0 % (ref 0–1)
Eosinophils, CSF: 0 % (ref 0–1)
Lymphs, CSF: 10 % — ABNORMAL LOW (ref 40–80)
Lymphs, CSF: 16 % — ABNORMAL LOW (ref 40–80)
Monocyte-Macrophage-Spinal Fluid: 2 % — ABNORMAL LOW (ref 15–45)
Monocyte-Macrophage-Spinal Fluid: 2 % — ABNORMAL LOW (ref 15–45)
RBC Count, CSF: 24 /mm3 — ABNORMAL HIGH
RBC Count, CSF: 30 /mm3 — ABNORMAL HIGH
Segmented Neutrophils-CSF: 82 % — ABNORMAL HIGH (ref 0–6)
Segmented Neutrophils-CSF: 88 % — ABNORMAL HIGH (ref 0–6)
Tube #: 1
Tube #: 4
WBC, CSF: 348 /mm3 (ref 0–5)
WBC, CSF: 548 /mm3 (ref 0–5)

## 2022-07-19 LAB — LACTIC ACID, PLASMA
Lactic Acid, Venous: 3.1 mmol/L (ref 0.5–1.9)
Lactic Acid, Venous: 5 mmol/L (ref 0.5–1.9)
Lactic Acid, Venous: 2 mmol/L (ref 0.5–1.9)

## 2022-07-19 LAB — PROTIME-INR
INR: 1.2 (ref 0.8–1.2)
Prothrombin Time: 14.9 seconds (ref 11.4–15.2)

## 2022-07-19 LAB — URINALYSIS, ROUTINE W REFLEX MICROSCOPIC
Bilirubin Urine: NEGATIVE
Glucose, UA: NEGATIVE mg/dL
Ketones, ur: 80 mg/dL — AB
Leukocytes,Ua: NEGATIVE
Nitrite: NEGATIVE
Protein, ur: NEGATIVE mg/dL
Specific Gravity, Urine: 1.018 (ref 1.005–1.030)
pH: 5 (ref 5.0–8.0)

## 2022-07-19 LAB — TROPONIN I (HIGH SENSITIVITY)
Troponin I (High Sensitivity): 9 ng/L (ref <18)
Troponin I (High Sensitivity): 16 ng/L (ref <18)

## 2022-07-19 LAB — I-STAT BETA HCG BLOOD, ED (MC, WL, AP ONLY): I-stat hCG, quantitative: <5 m[IU]/mL (ref <5)

## 2022-07-19 LAB — RAPID URINE DRUG SCREEN, HOSP PERFORMED
Amphetamines: NOT DETECTED
Barbiturates: NOT DETECTED
Benzodiazepines: NOT DETECTED
Cocaine: NOT DETECTED
Opiates: NOT DETECTED
Tetrahydrocannabinol: NOT DETECTED

## 2022-07-19 LAB — PROTEIN, CSF: Total  Protein, CSF: 162 mg/dL — ABNORMAL HIGH (ref 15–45)

## 2022-07-19 LAB — APTT: aPTT: 34 seconds (ref 24–36)

## 2022-07-19 LAB — TSH: TSH: 0.065 u[IU]/mL — ABNORMAL LOW (ref 0.350–4.500)

## 2022-07-19 LAB — GLUCOSE, CSF: Glucose, CSF: 27 mg/dL — CL (ref 40–70)

## 2022-07-19 LAB — ACETAMINOPHEN LEVEL: Acetaminophen (Tylenol), Serum: <10 ug/mL — ABNORMAL LOW (ref 10–30)

## 2022-07-19 MED ORDER — HALOPERIDOL LACTATE 5 MG/ML IJ SOLN
5.0000 mg | Freq: Once | INTRAMUSCULAR | Status: AC
  Administered 2022-07-19: 5 mg via INTRAVENOUS
  Filled 2022-07-19: qty 1

## 2022-07-19 MED ORDER — CHLORHEXIDINE GLUCONATE CLOTH 2 % EX PADS
6.0000 | MEDICATED_PAD | Freq: Every day | CUTANEOUS | Status: DC
  Administered 2022-07-19 – 2022-07-23 (×4): 6 via TOPICAL

## 2022-07-19 MED ORDER — LACTATED RINGERS IV SOLN: INTRAVENOUS | Status: DC

## 2022-07-19 MED ORDER — VANCOMYCIN HCL 750 MG/150ML IV SOLN
750.0000 mg | Freq: Three times a day (TID) | INTRAVENOUS | Status: DC
  Administered 2022-07-20 (×2): 750 mg via INTRAVENOUS
  Filled 2022-07-19 (×3): qty 150

## 2022-07-19 MED ORDER — VANCOMYCIN HCL IN DEXTROSE 1-5 GM/200ML-% IV SOLN: 1000.0000 mg | Freq: Two times a day (BID) | INTRAVENOUS | Status: DC

## 2022-07-19 MED ORDER — SODIUM CHLORIDE 0.9 % IV SOLN
2.0000 g | Freq: Three times a day (TID) | INTRAVENOUS | Status: DC
  Administered 2022-07-20 (×2): 2 g via INTRAVENOUS
  Filled 2022-07-19 (×2): qty 12.5

## 2022-07-19 MED ORDER — ORAL CARE MOUTH RINSE: 15.0000 mL | OROMUCOSAL | Status: DC | PRN

## 2022-07-19 MED ORDER — LACTATED RINGERS IV BOLUS
1000.0000 mL | Freq: Once | INTRAVENOUS | Status: AC
  Administered 2022-07-19: 1000 mL via INTRAVENOUS

## 2022-07-19 MED ORDER — CHLORHEXIDINE GLUCONATE CLOTH 2 % EX PADS: 6.0000 | MEDICATED_PAD | Freq: Every day | CUTANEOUS | Status: DC

## 2022-07-19 MED ORDER — VANCOMYCIN HCL IN DEXTROSE 1-5 GM/200ML-% IV SOLN: 1000.0000 mg | Freq: Once | INTRAVENOUS | Status: DC

## 2022-07-19 MED ORDER — ORAL CARE MOUTH RINSE
15.0000 mL | OROMUCOSAL | Status: DC
  Administered 2022-07-20: 15 mL via OROMUCOSAL

## 2022-07-19 MED ORDER — DEXMEDETOMIDINE HCL IN NACL 400 MCG/100ML IV SOLN
0.4000 ug/kg/h | INTRAVENOUS | Status: DC
  Administered 2022-07-19: 0.4 ug/kg/h via INTRAVENOUS
  Administered 2022-07-20: 1 ug/kg/h via INTRAVENOUS
  Filled 2022-07-19 (×3): qty 100

## 2022-07-19 MED ORDER — SODIUM BICARBONATE 8.4 % IV SOLN
100.0000 meq | Freq: Once | INTRAVENOUS | Status: AC
  Administered 2022-07-19: 100 meq via INTRAVENOUS
  Filled 2022-07-19: qty 50

## 2022-07-19 MED ORDER — LORAZEPAM 2 MG/ML IJ SOLN
1.0000 mg | Freq: Once | INTRAMUSCULAR | Status: AC
  Administered 2022-07-19: 1 mg via INTRAVENOUS
  Filled 2022-07-19: qty 1

## 2022-07-19 MED ORDER — METRONIDAZOLE 500 MG/100ML IV SOLN
500.0000 mg | Freq: Once | INTRAVENOUS | Status: AC
  Administered 2022-07-19: 500 mg via INTRAVENOUS
  Filled 2022-07-19: qty 100

## 2022-07-19 MED ORDER — POLYETHYLENE GLYCOL 3350 17 G PO PACK
17.0000 g | PACK | Freq: Every day | ORAL | Status: DC | PRN
  Administered 2022-07-20: 17 g via ORAL
  Filled 2022-07-19: qty 1

## 2022-07-19 MED ORDER — ACETAMINOPHEN 650 MG RE SUPP
650.0000 mg | Freq: Once | RECTAL | Status: AC
  Administered 2022-07-19: 650 mg via RECTAL
  Filled 2022-07-19: qty 1

## 2022-07-19 MED ORDER — ACETAMINOPHEN 325 MG PO TABS
650.0000 mg | ORAL_TABLET | ORAL | Status: DC | PRN
  Administered 2022-07-20 (×2): 650 mg via ORAL
  Filled 2022-07-19 (×2): qty 2

## 2022-07-19 MED ORDER — DOCUSATE SODIUM 100 MG PO CAPS
100.0000 mg | ORAL_CAPSULE | Freq: Two times a day (BID) | ORAL | Status: DC | PRN
  Administered 2022-07-20: 100 mg via ORAL
  Filled 2022-07-19: qty 1

## 2022-07-19 MED ORDER — NOREPINEPHRINE 4 MG/250ML-% IV SOLN
0.0000 ug/min | INTRAVENOUS | Status: DC
  Administered 2022-07-19: 2 ug/min via INTRAVENOUS
  Filled 2022-07-19: qty 250

## 2022-07-19 MED ORDER — SODIUM CHLORIDE 0.9 % IV SOLN
2.0000 g | Freq: Once | INTRAVENOUS | Status: AC
  Administered 2022-07-19: 2 g via INTRAVENOUS
  Filled 2022-07-19: qty 12.5

## 2022-07-19 MED ORDER — DEXAMETHASONE SODIUM PHOSPHATE 10 MG/ML IJ SOLN
12.0000 mg | Freq: Two times a day (BID) | INTRAMUSCULAR | Status: AC
  Administered 2022-07-20 – 2022-07-21 (×4): 12 mg via INTRAVENOUS
  Filled 2022-07-19 (×4): qty 2

## 2022-07-19 MED ORDER — INSULIN ASPART 100 UNIT/ML IJ SOLN
0.0000 [IU] | INTRAMUSCULAR | Status: DC
  Administered 2022-07-20: 3 [IU] via SUBCUTANEOUS
  Administered 2022-07-20 (×2): 2 [IU] via SUBCUTANEOUS
  Administered 2022-07-21: 1 [IU] via SUBCUTANEOUS
  Administered 2022-07-21: 3 [IU] via SUBCUTANEOUS
  Administered 2022-07-21: 1 [IU] via SUBCUTANEOUS
  Administered 2022-07-21: 2 [IU] via SUBCUTANEOUS
  Administered 2022-07-21: 1 [IU] via SUBCUTANEOUS
  Administered 2022-07-22: 2 [IU] via SUBCUTANEOUS

## 2022-07-19 MED ORDER — VANCOMYCIN HCL 1500 MG/300ML IV SOLN
1500.0000 mg | Freq: Once | INTRAVENOUS | Status: AC
  Administered 2022-07-19: 1500 mg via INTRAVENOUS
  Filled 2022-07-19: qty 300

## 2022-07-19 MED ORDER — ACETAMINOPHEN 650 MG RE SUPP: 650.0000 mg | RECTAL | Status: DC | PRN

## 2022-07-19 MED ORDER — DEXTROSE 5 % IV SOLN
10.0000 mg/kg | Freq: Three times a day (TID) | INTRAVENOUS | Status: DC
  Administered 2022-07-19 – 2022-07-20 (×2): 770 mg via INTRAVENOUS
  Filled 2022-07-19 (×8): qty 15.4

## 2022-07-19 MED ORDER — LACTATED RINGERS IV BOLUS (SEPSIS)
1000.0000 mL | Freq: Once | INTRAVENOUS | Status: AC
  Administered 2022-07-19: 1000 mL via INTRAVENOUS

## 2022-07-19 NOTE — H&P (Signed)
NAME:  Sara Mathis, MRN:  [id number], DOB:  1986-08-02, LOS: 0 ADMISSION DATE:  07/19/2022, CONSULTATION DATE:  07/19/22 REFERRING MD:  EDP, CHIEF COMPLAINT:  AMS   History of Present Illness:  Sara Mathis is a 36 y.o. F with PMH significant for migraines and hypothyroidism who presented to the ED with acute altered mental status.  Per her roommate, she was in her normal state of health 11/1, then stopped talking and following commands the next day.  She has had increased migraine headaches for the last month.  She was initially febrile to 101F, tachycardic and agitated.  Initial labs showed lactic acid of 3.1, no leukocytosis, TSH 0.065, normal ethanol, UDS pending.  UA and CXR without clear source of infection.  She was given 3L IVF, broad spectrum abx and head CT/LP pending.  Her agitation required precedex infusion, so PCCM consulted for admission  Upon evaluation pt is arousable to painful stim, no sonorous respirations and appears to be protecting airway. Laying completely flat supine on RA with adequate sats. She provides no history.  Pertinent  Medical History   has a past medical history of Overweight (BMI 25.0-29.9) (06/10/2017) and Thyroid disease (02/21/2015).   Significant Hospital Events: Including procedures, antibiotic start and stop dates in addition to other pertinent events   11/2 presented to ED altered, tachycardic and febrile, concern for sepsis and meningitis, on precedex, got Cefepime, Flagyl, Vanc added acyclovir empirically until LP done and resulted  Interim History / Subjective:    Objective   Blood pressure 128/82, pulse (!) 141, temperature (!) 101.6 F (38.7 C), temperature source Axillary, resp. rate (!) 23, height '5\' 6"'$  (1.676 m), weight 77 kg, last menstrual period 04/05/2021, SpO2 100 %, unknown if currently breastfeeding.        Intake/Output Summary (Last 24 hours) at 07/19/2022 1836 Last data filed at 07/19/2022 1811 Gross per 24 hour   Intake 1300 ml  Output --  Net 1300 ml   Filed Weights   07/19/22 1616  Weight: 77 kg    Examination: General: minimally responsive with incoherent sounds, laying flat and supine on RA HENT: ncat, eomi, perrla, mmmp Lungs: ctab Cardiovascular: rrr Abdomen: soft, non distended Extremities: no c/c/e no rashes noted anteriorly Neuro: arousable but with incoherent sounds. Will move away painful stim. Protecting airway GU: deferred  Resolved Hospital Problem list     Assessment & Plan:    Acute Encephalopathy Currently unclear etiology, but initial concern for meningitis -CT head pending, plan for LP in the ED -UDS pending -admit to ICU, continue precedex    Severe Sepsis  Suspect cause of AMS -empiric abx cefepime, vanc and acyclovir until LP completed and resulted.  -f/u blood and urine cx as well.  -monitor BP -rectal tylenol  Lactic acidosis:  -cont with ivf   History of Hypothyroidism -cont home levo but unable to take po so will do iv when indicated  Best Practice (right click and "Reselect all SmartList Selections" daily)   Diet/type: NPO DVT prophylaxis: SCD GI prophylaxis: N/A Lines: N/A Foley:  Yes, and it is still needed Code Status:  full code Last date of multidisciplinary goals of care discussion [awaiting contact with family. ]  Labs   CBC: Recent Labs  Lab 07/19/22 1640  WBC 8.1  NEUTROABS 5.1  HGB 14.9  HCT 44.7  MCV 90.3  PLT 076    Basic Metabolic Panel: Recent Labs  Lab 07/19/22 1640  NA 135  K 4.5  CL 101  CO2 16*  GLUCOSE 94  BUN 12  CREATININE 0.94  CALCIUM 9.4   GFR: Estimated Creatinine Clearance: 85.9 mL/min (by C-G formula based on SCr of 0.94 mg/dL). Recent Labs  Lab 07/19/22 1640  WBC 8.1  LATICACIDVEN 3.1*    Liver Function Tests: Recent Labs  Lab 07/19/22 1640  AST 29  ALT 21  ALKPHOS 48  BILITOT 2.0*  PROT 7.3  ALBUMIN 3.3*   No results for input(s): "LIPASE", "AMYLASE" in the last 168  hours. No results for input(s): "AMMONIA" in the last 168 hours.  ABG No results found for: "PHART", "PCO2ART", "PO2ART", "HCO3", "TCO2", "ACIDBASEDEF", "O2SAT"   Coagulation Profile: Recent Labs  Lab 07/19/22 1640  INR 1.2    Cardiac Enzymes: Recent Labs  Lab 07/19/22 1640  CKTOTAL 85    HbA1C: No results found for: "HGBA1C"  CBG: No results for input(s): "GLUCAP" in the last 168 hours.  Review of Systems:   Unobtainable 2/2 pt's mental status  Past Medical History:  She,  has a past medical history of Overweight (BMI 25.0-29.9) (06/10/2017) and Thyroid disease (02/21/2015).   Surgical History:   Past Surgical History:  Procedure Laterality Date   THERAPEUTIC ABORTION  06/2020   At 6 weeks--oral meds did not work   THYROIDECTOMY Bilateral 02/21/2015   For Grave's Disease not well controlled on medication     Social History:   reports that she has never smoked. She has never been exposed to tobacco smoke. She has never used smokeless tobacco. She reports that she does not currently use alcohol. She reports that she does not use drugs.   Family History:  Her family history includes ADD / ADHD in her brother; Diabetes in her father and mother; Fibromyalgia in her mother; Heart disease in her mother.   Allergies No Known Allergies   Home Medications  Prior to Admission medications   Medication Sig Start Date End Date Taking? Authorizing Provider  calcium citrate-vitamin D 500-400 MG-UNIT chewable tablet 1 tab by mouth twice daily. 12/29/20   [name], MD  cetirizine (ZYRTEC) 10 MG tablet Take 1 tablet (10 mg total) by mouth daily. Patient not taking: Reported on 07/17/2022 05/15/19   [name], MD  cyclobenzaprine (FLEXERIL) 5 MG tablet Take 5 mg by mouth at bedtime as needed. 06/30/22   [provider]  levothyroxine (SYNTHROID) 125 MCG tablet TAKE 1 TABLET BY MOUTH EVERY DAY BEFORE BREAKFAST 06/14/22   [name], MD   ondansetron [facility]) 4 MG tablet 1 to 2 tabs by mouth every 8 hours as needed for nausea and vomiting 07/17/22   [name], MD  topiramate (TOPAMAX) 25 MG tablet 1 tab by mouth at bedtime 07/17/22   [name], MD     Critical care time: 71mn excluding procedures.

## 2022-07-19 NOTE — ED Triage Notes (Signed)
Patient coming from home, roommate reports she was normal yesterday. Today she is alert but not talking, and not following commands. She has been having migraine symptoms for a month per roommate. [facility] reported she was on antibiotics for a infection, she also has a history of thyroid issues. She arrives to the ED febrile, confused, and agitated.

## 2022-07-19 NOTE — ED Provider Notes (Signed)
[facility] EMERGENCY DEPARTMENT Provider Note   CSN: [id number] Arrival date & time: 07/19/22  1553     History  Chief Complaint  Patient presents with   Altered Mental Status    Sara Mathis is a 36 y.o. female.  Level 5 caveat for altered mental status.  Patient brought from home confused, not speaking, altered.  Patient not speaking or following commands on arrival, intermittently combative.  She was apparently last seen normal yesterday by her roommate.  She has a history of migraine headaches for which she takes sumatriptan.  She was also on antibiotics last month for unclear of reason.  On arrival to tachycardic to 150s, febrile to 103.8.   Unable to give any history. No family at bedside  Chart review shows patient was seen by PCP in October 31 for migraines.  No emergency contact information in the chart. No answer to the only contact number provided.  Patient's wife Verdis Frederickson has arrived.  She states patient was last seen normal yesterday when she saw her.  Does not know if she has used any drugs or alcohol.  Does not know of any recent illnesses but states she was taking antibiotics for a dental infection.  The history is provided by the patient.  Altered Mental Status      Home Medications Prior to Admission medications   Medication Sig Start Date End Date Taking? Authorizing Provider  calcium citrate-vitamin D 500-400 MG-UNIT chewable tablet 1 tab by mouth twice daily. 12/29/20   [name], MD  cetirizine (ZYRTEC) 10 MG tablet Take 1 tablet (10 mg total) by mouth daily. Patient not taking: Reported on 07/17/2022 05/15/19   [name], MD  cyclobenzaprine (FLEXERIL) 5 MG tablet Take 5 mg by mouth at bedtime as needed. 06/30/22   [provider]  levothyroxine (SYNTHROID) 125 MCG tablet TAKE 1 TABLET BY MOUTH EVERY DAY BEFORE BREAKFAST 06/14/22   [name], MD  ondansetron [facility]) 4 MG tablet 1 to  2 tabs by mouth every 8 hours as needed for nausea and vomiting 07/17/22   [name], MD  topiramate (TOPAMAX) 25 MG tablet 1 tab by mouth at bedtime 07/17/22   [name], MD      Allergies    Patient has no known allergies.    Review of Systems   Review of Systems  Unable to perform ROS: Mental status change    Physical Exam Updated Vital Signs BP 120/87   Pulse (!) 156   Temp (!) 103.8 F (39.9 C) (Oral)   Resp (!) 23   Ht '5\' 6"'$  (1.676 m)   Wt 77 kg   LMP 04/05/2021 (Approximate) Comment: regular  SpO2 100%   BMI 27.40 kg/m  Physical Exam Constitutional:      General: She is in acute distress.     Appearance: She is ill-appearing.     Comments: Confused, not speaking, not following commands, intermittently agitated  HENT:     Nose: No rhinorrhea.     Mouth/Throat:     Mouth: Mucous membranes are moist.  Eyes:     Extraocular Movements: Extraocular movements intact.     Pupils: Pupils are equal, round, and reactive to light.     Comments: Resists eye opening, photophobic  Cardiovascular:     Rate and Rhythm: Tachycardia present.  Pulmonary:     Effort: Pulmonary effort is normal. No respiratory distress.     Breath sounds: Normal breath sounds.  Abdominal:  Tenderness: There is no abdominal tenderness. There is no guarding or rebound.  Musculoskeletal:        General: No swelling or tenderness. Normal range of motion.  Skin:    General: Skin is warm.     Capillary Refill: Capillary refill takes less than 2 seconds.  Neurological:     Comments: Does not speak, does not follow commands, moves extremities spontaneously, intermittently combative     ED Results / Procedures / Treatments   Labs (all labs ordered are listed, but only abnormal results are displayed) Labs Reviewed  LACTIC ACID, PLASMA - Abnormal; Notable for the following components:      Result Value   Lactic Acid, Venous 3.1 (*)    All other components within normal  limits  LACTIC ACID, PLASMA - Abnormal; Notable for the following components:   Lactic Acid, Venous 5.0 (*)    All other components within normal limits  COMPREHENSIVE METABOLIC PANEL - Abnormal; Notable for the following components:   CO2 16 (*)    Albumin 3.3 (*)    Total Bilirubin 2.0 (*)    Anion gap 18 (*)    All other components within normal limits  URINALYSIS, ROUTINE W REFLEX MICROSCOPIC - Abnormal; Notable for the following components:   Color, Urine AMBER (*)    Hgb urine dipstick SMALL (*)    Ketones, ur 80 (*)    Bacteria, UA RARE (*)    All other components within normal limits  TSH - Abnormal; Notable for the following components:   TSH 0.065 (*)    All other components within normal limits  SALICYLATE LEVEL - Abnormal; Notable for the following components:   Salicylate Lvl <3.0 (*)    All other components within normal limits  ACETAMINOPHEN LEVEL - Abnormal; Notable for the following components:   Acetaminophen (Tylenol), Serum <10 (*)    All other components within normal limits  RESP PANEL BY RT-PCR (FLU A&B, COVID) ARPGX2  CULTURE, BLOOD (ROUTINE X 2)  CULTURE, BLOOD (ROUTINE X 2)  URINE CULTURE  SARS CORONAVIRUS 2 BY RT PCR  CSF CULTURE W GRAM STAIN  CBC WITH DIFFERENTIAL/PLATELET  PROTIME-INR  APTT  CK  ETHANOL  RAPID URINE DRUG SCREEN, HOSP PERFORMED  CSF CELL COUNT WITH DIFFERENTIAL  CSF CELL COUNT WITH DIFFERENTIAL  GLUCOSE, CSF  PROTEIN, CSF  MENINGITIS/ENCEPHALITIS PANEL (CSF)  CBC  BASIC METABOLIC PANEL  MAGNESIUM  PHOSPHORUS  I-STAT BETA HCG BLOOD, ED ([facility], [facility], [facility] ONLY)  TROPONIN I (HIGH SENSITIVITY)  TROPONIN I (HIGH SENSITIVITY)    EKG EKG Interpretation  Date/Time:  Thursday July 19 2022 16:43:48 EDT Ventricular Rate:  139 PR Interval:  135 QRS Duration: 87 QT Interval:  290 QTC Calculation: 441 R Axis:   137 Text Interpretation: Sinus tachycardia Right axis deviation No previous ECGs available Confirmed by Ezequiel Essex [phone]) on 07/19/2022 4:46:00 PM  Radiology CT Head Wo Contrast  Result Date: 07/19/2022 CLINICAL DATA:  Altered mental status. EXAM: CT HEAD WITHOUT CONTRAST TECHNIQUE: Contiguous axial images were obtained from the base of the skull through the vertex without intravenous contrast. RADIATION DOSE REDUCTION: This exam was performed according to the departmental dose-optimization program which includes automated exposure control, adjustment of the mA and/or kV according to patient size and/or use of iterative reconstruction technique. COMPARISON:  Head CT dated 05/02/2022. FINDINGS: Brain: The ventricles and sulci are appropriate size for the patient's age. The gray-white matter discrimination is preserved. There is no acute intracranial hemorrhage. 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 Further evaluation with pituitary mass protocol MRI recommended. No mass effect or midline shift. No extra-axial fluid collection. Vascular: No hyperdense vessel or unexpected calcification. Skull: Normal. Negative for fracture or focal lesion. Sinuses/Orbits: Mild mucoperiosteal thickening of paranasal sinuses. No air-fluid level. The mastoid air cells are clear. Other: None IMPRESSION: 1. No acute intracranial hemorrhage. 2. Enlarging sellar/suprasellar mass. Further evaluation with pituitary mass protocol MRI recommended. Electronically Signed   By: Anner Crete M.D.   On: 07/19/2022 19:07   DG Chest Port 1 View  Result Date: 07/19/2022 CLINICAL DATA:  Altered mental status possible sepsis EXAM: PORTABLE CHEST 1 VIEW COMPARISON:  None Available. FINDINGS: The heart size and mediastinal contours are within normal limits. Both lungs are clear. The visualized skeletal structures are unremarkable. Clips at the right thoracic inlet. IMPRESSION: No active disease. Electronically Signed   By: Donavan Foil M.D.    On: 07/19/2022 17:33    Procedures .Lumbar Puncture  Date/Time: 07/19/2022 7:34 PM  Performed by: Ezequiel Essex, MD Authorized by: Ezequiel Essex, MD   Consent:    Consent obtained:  Written and emergent situation   Consent given by:  Spouse   Risks, benefits, and alternatives were discussed: yes     Risks discussed:  Bleeding, headache, nerve damage, infection, pain and repeat procedure   Alternatives discussed:  No treatment Universal protocol:    Procedure explained and questions answered to patient or proxy's satisfaction: yes     Relevant documents present and verified: yes     Test results available: yes     Imaging studies available: yes     Required blood products, implants, devices, and special equipment available: yes     Immediately prior to procedure a time out was called: yes     Site/side marked: yes     Patient identity confirmed:  Hospital-assigned identification number Pre-procedure details:    Procedure purpose:  Diagnostic   Preparation: Patient was prepped and draped in usual sterile fashion   Anesthesia:    Anesthesia method:  Local infiltration   Local anesthetic:  Lidocaine 1% w/o epi Procedure details:    Lumbar space:  L4-L5 interspace   Patient position:  L lateral decubitus   Needle gauge:  20   Needle type:  Spinal needle - Quincke tip   Needle length (in):  2.5   Ultrasound guidance: no     Number of attempts:  2   Fluid appearance:  Clear   Tubes of fluid:  4   Total volume (ml):  8 Post-procedure details:    Puncture site:  Adhesive bandage applied   Procedure completion:  Tolerated well, no immediate complications .Critical Care  Performed by: Ezequiel Essex, MD Authorized by: Ezequiel Essex, MD   Critical care provider statement:    Critical care time (minutes):  60   Critical care time was exclusive of:  Separately billable procedures and treating other patients   Critical care was necessary to treat or prevent imminent or  life-threatening deterioration of the following conditions:  Sepsis and CNS failure or compromise   Critical care was time spent personally by me on the following activities:  Development of treatment plan with patient or surrogate, discussions with consultants, evaluation of patient's response to treatment, examination of patient, ordering and review of laboratory studies, ordering and review of radiographic studies, ordering and performing treatments and interventions,  pulse oximetry, re-evaluation of patient's condition, review of old charts and blood draw for specimens   I assumed direction of critical care for this patient from another provider in my specialty: no     Care discussed with: admitting provider       Medications Ordered in ED Medications  lactated ringers infusion (has no administration in time range)  lactated ringers bolus 1,000 mL (has no administration in time range)  ceFEPIme (MAXIPIME) 2 g in sodium chloride 0.9 % 100 mL IVPB (has no administration in time range)  metroNIDAZOLE (FLAGYL) IVPB 500 mg (has no administration in time range)  vancomycin (VANCOCIN) IVPB 1000 mg/200 mL premix (has no administration in time range)  lactated ringers bolus 1,000 mL (has no administration in time range)  LORazepam (ATIVAN) injection 1 mg (has no administration in time range)    ED Course/ Medical Decision Making/ A&P                           Medical Decision Making Amount and/or Complexity of Data Reviewed Labs: ordered. Decision-making details documented in ED Course. Radiology: ordered and independent interpretation performed. Decision-making details documented in ED Course. ECG/medicine tests: ordered and independent interpretation performed. Decision-making details documented in ED Course.  Risk OTC drugs. Prescription drug management. Decision regarding hospitalization.   Altered mental status, last seen normal yesterday.  Patient unable to give any history.  No  family on arrival.  She is confused, combative, nonverbal.  Code sepsis activated given her mental status change, tachycardia and fever.  We will initiate IV fluids, IV antibiotics after cultures are obtained.  Patient combative, agitated, not cooperative.  Received multiple doses of sedating medications including Ativan and Haldol.  Ultimately started on Precedex drip to control her agitation.  Labs show elevated lactate.  White blood cell count is normal.  No clear infection identified on urinalysis or chest x-ray.  Given fever, altered mental status and recent PCP visits for headache there is high suspicion for meningitis or encephalitis. CT head shows no hemorrhage but does show enlarging suprasellar mass.  Discussed with Dr. Malen Gauze of neurology who reviewed images and agrees no contraindication to lumbar puncture.  Lumbar puncture performed as above.  Patient requiring sedation with Precedex drip as well as soft restraints.  She was protecting her airway.  Admission discussed with critical care team.  Lactic acidosis with anion gap.  Continue aggressive IV hydration.  Continue broad-spectrum antibiotics while cultures and CSF studies are pending.  Remains protecting airway on Precedex infusion.        Final Clinical Impression(s) / ED Diagnoses Final diagnoses:  Acute encephalopathy    Rx / DC Orders ED Discharge Orders     None         Tuwana Kapaun, Annie Main, MD 07/19/22 2107

## 2022-07-19 NOTE — ED Notes (Signed)
RN attempting to get 2nd IV and blood cultures. Patient thrashing around and tossing and turning in bed. Not obeying commands. EDP aware and RN awaiting orders

## 2022-07-19 NOTE — ED Notes (Signed)
Pt found standing at the edge of the be, restraints still in place. Assisted back to bed, EDP at bedside.

## 2022-07-19 NOTE — Progress Notes (Signed)
Pharmacy Antibiotic Note  Sara Mathis is a 36 y.o. 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 Pharmacy has been consulted for acyclovir dosing.   Patient presenting with AMS. Roommate states patient was in normal state of health on 11/1, then stopped talking and following commands today. On presentation, was febrile to 101, tachycardic, but no leukocytosis. Team is concerned for meningitis and would like to start HSV coverage.  Plan: Metronidazole per MD Acyclovir 770 mg IV q8 hours Vancomycin '750mg'$  IV q8 hours Cefepime 2g IV q8 hours Monitor clinical status, renal function, culture data, and LOT Follow-up meningitis panel and de-escalate as able  Height: '5\' 6"'$  (167.6 cm) Weight: 77 kg (169 lb 12.1 oz) IBW/kg (Calculated) : 59.3  Temp (24hrs), Avg:102.7 F (39.3 C), Min:101.6 F (38.7 C), Max:103.8 F (39.9 C)  Recent Labs  Lab 07/19/22 1640 07/19/22 1916  WBC 8.1  --   CREATININE 0.94  --   LATICACIDVEN 3.1* 5.0*    Estimated Creatinine Clearance: 85.9 mL/min (by C-G formula based on SCr of 0.94 mg/dL).    No Known Allergies  Antimicrobials this admission: metronidazole 11/2 >> Vancomycin 11/2 >> Cefepime 11/2 >> Acyclovir 11/2 >>  Dose adjustments this admission:  Microbiology results: 11/2 Bcx: 11/2 Ucx: 11/2: RVP: neg 11/2 CSF cx:  Thank you for allowing pharmacy to be a part of this patient's care.  Louanne Belton, PharmD, [facility] PGY1 Pharmacy Resident 07/19/2022 10:23 PM

## 2022-07-19 NOTE — ED Notes (Signed)
Pt not responding to stimuli, EDP at bedside, decreased precedex rate.

## 2022-07-19 NOTE — ED Notes (Signed)
Patient is back from CT.

## 2022-07-19 NOTE — Sepsis Progress Note (Signed)
Code sepsis protocol being monitored by eLink. 

## 2022-07-19 NOTE — ED Notes (Signed)
Patient transported to CT with RN 

## 2022-07-19 NOTE — Progress Notes (Signed)
Pharmacy Antibiotic Note  Sara Mathis is a 36 y.o. female for which pharmacy has been consulted for cefepime and vancomycin dosing for sepsis.  Patient with a history of migraines, hypothyroidism. Patient presenting with AMS.  SCr 0.94 WBC 8.1; LA 3.1; T 101.6; HR 87; RR 16  Plan: Metronidazole per MD Cefepime 2g q8hr Vancomycin 1500 mg once then 1000 mg q12hr (eAUC 530) unless change in renal function Trend WBC, Fever, Renal function F/u cultures, clinical course, WBC, fever De-escalate when able Levels at steady state  Height: '5\' 6"'$  (167.6 cm) Weight: 77 kg (169 lb 12.1 oz) IBW/kg (Calculated) : 59.3  Temp (24hrs), Avg:103.8 F (39.9 C), Min:103.8 F (39.9 C), Max:103.8 F (39.9 C)  No results for input(s): "WBC", "CREATININE", "LATICACIDVEN", "VANCOTROUGH", "VANCOPEAK", "VANCORANDOM", "GENTTROUGH", "GENTPEAK", "GENTRANDOM", "TOBRATROUGH", "TOBRAPEAK", "TOBRARND", "AMIKACINPEAK", "AMIKACINTROU", "AMIKACIN" in the last 168 hours.  CrCl cannot be calculated (Patient's most recent lab result is older than the maximum 21 days allowed.).    No Known Allergies  Antimicrobials this admission: cefepime 11/2 >>  vancomycin 11/2 >>  flagyl 11/2 >>   Microbiology results: Pending  Thank you for allowing pharmacy to be a part of this patient's care.  Lorelei Pont, PharmD, BCPS 07/19/2022 4:38 PM ED Clinical Pharmacist -  [phone]

## 2022-07-19 NOTE — ED Notes (Addendum)
Patients wife at beside. Patient resting comfortably.

## 2022-07-19 NOTE — ED Notes (Signed)
EDP at bedside for lumbar puncture procedure.

## 2022-07-19 NOTE — Progress Notes (Signed)
eLink Physician-Brief Progress Note Patient Name: Sara Mathis DOB: [birth year] MRN: [id number]   Date of Service  07/19/2022  HPI/Events of Note  44 F history of hypothyroidism (TSH 0.065) and migraines presented with altered sensorium that occurred acutely as per room mate. Temp 101 without clear source of infection on work-up.  Head CT with enlarging suprasellar mass  Spinal fluid analysis concerning for infection  eICU Interventions  On adequate coverage for meningitis Ongoing fluids Will start dexamethasone 12 mg Q 12 for 2 days and follow cultures. Longer course of steroids for Strep. Bedside rounding team to follow on this Will start sliding scale insulin while on systemic steroids     Intervention Category Evaluation Type: New Patient Evaluation  Judd Lien 07/19/2022, 11:16 PM

## 2022-07-19 NOTE — Sepsis Progress Note (Signed)
Notified provider of need to draw repeat lactic acid. 

## 2022-07-19 NOTE — ED Notes (Signed)
Pt resting comfortably at this time.

## 2022-07-20 ENCOUNTER — Inpatient Hospital Stay (HOSPITAL_COMMUNITY): Payer: Self-pay

## 2022-07-20 DIAGNOSIS — E230 Hypopituitarism: Secondary | ICD-10-CM

## 2022-07-20 DIAGNOSIS — G9340 Encephalopathy, unspecified: Secondary | ICD-10-CM

## 2022-07-20 LAB — BASIC METABOLIC PANEL
Anion gap: 3 — ABNORMAL LOW (ref 5–15)
Anion gap: 10 (ref 5–15)
BUN: 11 mg/dL (ref 6–20)
BUN: 10 mg/dL (ref 6–20)
CO2: 24 mmol/L (ref 22–32)
CO2: 23 mmol/L (ref 22–32)
Calcium: 8.9 mg/dL (ref 8.9–10.3)
Calcium: 8.6 mg/dL — ABNORMAL LOW (ref 8.9–10.3)
Chloride: 122 mmol/L — ABNORMAL HIGH (ref 98–111)
Chloride: 118 mmol/L — ABNORMAL HIGH (ref 98–111)
Creatinine, Ser: 0.73 mg/dL (ref 0.44–1.00)
Creatinine, Ser: 0.68 mg/dL (ref 0.44–1.00)
GFR, Estimated: >60 mL/min (ref >60)
GFR, Estimated: >60 mL/min (ref >60)
Glucose, Bld: 195 mg/dL — ABNORMAL HIGH (ref 70–99)
Glucose, Bld: 195 mg/dL — ABNORMAL HIGH (ref 70–99)
Potassium: 4.3 mmol/L (ref 3.5–5.1)
Potassium: 2.8 mmol/L — ABNORMAL LOW (ref 3.5–5.1)
Sodium: 149 mmol/L — ABNORMAL HIGH (ref 135–145)
Sodium: 151 mmol/L — ABNORMAL HIGH (ref 135–145)

## 2022-07-20 LAB — GLUCOSE, CAPILLARY
Glucose-Capillary: 111 mg/dL — ABNORMAL HIGH (ref 70–99)
Glucose-Capillary: 120 mg/dL — ABNORMAL HIGH (ref 70–99)
Glucose-Capillary: 134 mg/dL — ABNORMAL HIGH (ref 70–99)
Glucose-Capillary: 161 mg/dL — ABNORMAL HIGH (ref 70–99)
Glucose-Capillary: 162 mg/dL — ABNORMAL HIGH (ref 70–99)
Glucose-Capillary: 208 mg/dL — ABNORMAL HIGH (ref 70–99)
Glucose-Capillary: 229 mg/dL — ABNORMAL HIGH (ref 70–99)

## 2022-07-20 LAB — MENINGITIS/ENCEPHALITIS PANEL (CSF)

## 2022-07-20 LAB — PHOSPHORUS
Phosphorus: 1.6 mg/dL — ABNORMAL LOW (ref 2.5–4.6)
Phosphorus: 3.1 mg/dL (ref 2.5–4.6)

## 2022-07-20 LAB — PATHOLOGIST SMEAR REVIEW

## 2022-07-20 LAB — CBC
HCT: 34.5 % — ABNORMAL LOW (ref 36.0–46.0)
Hemoglobin: 11.7 g/dL — ABNORMAL LOW (ref 12.0–15.0)
MCH: 30.5 pg (ref 26.0–34.0)
MCHC: 33.9 g/dL (ref 30.0–36.0)
MCV: 89.8 fL (ref 80.0–100.0)
Platelets: 134 10*3/uL — ABNORMAL LOW (ref 150–400)
RBC: 3.84 MIL/uL — ABNORMAL LOW (ref 3.87–5.11)
RDW: 11.6 % (ref 11.5–15.5)
WBC: 3.8 10*3/uL — ABNORMAL LOW (ref 4.0–10.5)
nRBC: 0 % (ref 0.0–0.2)

## 2022-07-20 LAB — LACTIC ACID, PLASMA: Lactic Acid, Venous: 1.1 mmol/L (ref 0.5–1.9)

## 2022-07-20 LAB — HEMOGLOBIN A1C
Hgb A1c MFr Bld: 4.2 % — ABNORMAL LOW (ref 4.8–5.6)
Mean Plasma Glucose: 73.84 mg/dL

## 2022-07-20 LAB — URINE CULTURE: Culture: NO GROWTH

## 2022-07-20 LAB — MAGNESIUM: Magnesium: 1.9 mg/dL (ref 1.7–2.4)

## 2022-07-20 MED ORDER — SODIUM PHOSPHATES 45 MMOLE/15ML IV SOLN
45.0000 mmol | Freq: Once | INTRAVENOUS | Status: AC
  Administered 2022-07-20: 45 mmol via INTRAVENOUS
  Filled 2022-07-20: qty 15

## 2022-07-20 MED ORDER — SODIUM CHLORIDE 0.9 % IV SOLN
2.0000 g | Freq: Two times a day (BID) | INTRAVENOUS | Status: DC
  Administered 2022-07-20 – 2022-07-23 (×6): 2 g via INTRAVENOUS
  Filled 2022-07-20 (×7): qty 20

## 2022-07-20 MED ORDER — POTASSIUM CHLORIDE 10 MEQ/100ML IV SOLN
10.0000 meq | INTRAVENOUS | Status: AC
  Administered 2022-07-20 – 2022-07-21 (×5): 10 meq via INTRAVENOUS
  Filled 2022-07-20 (×5): qty 100

## 2022-07-20 MED ORDER — GADOBUTROL 1 MMOL/ML IV SOLN
7.2000 mL | Freq: Once | INTRAVENOUS | Status: AC | PRN
  Administered 2022-07-20: 7.2 mL via INTRAVENOUS

## 2022-07-20 MED ORDER — VANCOMYCIN HCL IN DEXTROSE 1-5 GM/200ML-% IV SOLN
1000.0000 mg | Freq: Three times a day (TID) | INTRAVENOUS | Status: DC
  Administered 2022-07-20 – 2022-07-21 (×3): 1000 mg via INTRAVENOUS
  Filled 2022-07-20 (×3): qty 200

## 2022-07-20 MED ORDER — ENOXAPARIN SODIUM 40 MG/0.4ML IJ SOSY: 40.0000 mg | PREFILLED_SYRINGE | INTRAMUSCULAR | Status: DC

## 2022-07-20 MED ORDER — ORAL CARE MOUTH RINSE: 15.0000 mL | OROMUCOSAL | Status: DC | PRN

## 2022-07-20 MED ORDER — SODIUM CHLORIDE 0.9 % IV SOLN: INTRAVENOUS | Status: DC | PRN

## 2022-07-20 MED ORDER — MAGNESIUM SULFATE 2 GM/50ML IV SOLN
2.0000 g | Freq: Once | INTRAVENOUS | Status: AC
  Administered 2022-07-20: 2 g via INTRAVENOUS
  Filled 2022-07-20: qty 50

## 2022-07-20 MED ORDER — LEVOTHYROXINE SODIUM 25 MCG PO TABS
125.0000 ug | ORAL_TABLET | Freq: Every day | ORAL | Status: DC
  Administered 2022-07-21 – 2022-07-24 (×4): 125 ug via ORAL
  Filled 2022-07-20 (×4): qty 1

## 2022-07-20 MED ORDER — LACTATED RINGERS IV SOLN: INTRAVENOUS | Status: DC

## 2022-07-20 MED ORDER — ORAL CARE MOUTH RINSE
15.0000 mL | OROMUCOSAL | Status: DC
  Administered 2022-07-20 – 2022-07-24 (×12): 15 mL via OROMUCOSAL

## 2022-07-20 MED ORDER — METRONIDAZOLE 500 MG/100ML IV SOLN
500.0000 mg | Freq: Three times a day (TID) | INTRAVENOUS | Status: DC
  Administered 2022-07-20 – 2022-07-21 (×4): 500 mg via INTRAVENOUS
  Filled 2022-07-20 (×4): qty 100

## 2022-07-20 NOTE — Progress Notes (Signed)
NAME:  Sara Mathis, MRN:  [id number], DOB:  March 10, 1986, LOS: 1 ADMISSION DATE:  07/19/2022, CONSULTATION DATE:  07/20/22 REFERRING MD:  EDP, CHIEF COMPLAINT:  AMS   History of Present Illness:  Sara Mathis is a 36 y.o. F with PMH significant for migraines and hypothyroidism who presented to the ED with acute altered mental status.  Per her roommate, she was in her normal state of health 11/1, then stopped talking and following commands the next day.  She has had increased migraine headaches for the last month.  She was initially febrile to 101F, tachycardic and agitated.  Initial labs showed lactic acid of 3.1, no leukocytosis, TSH 0.065, normal ethanol, UDS pending.  UA and CXR without clear source of infection.  She was given 3L IVF, broad spectrum abx and head CT/LP pending.  Her agitation required precedex infusion, so PCCM consulted for admission  Upon evaluation pt is arousable to painful stim, no sonorous respirations and appears to be protecting airway. Laying completely flat supine on RA with adequate sats. She provides no history.  Pertinent  Medical History   has a past medical history of Overweight (BMI 25.0-29.9) (06/10/2017) and Thyroid disease (02/21/2015).   Significant Hospital Events: Including procedures, antibiotic start and stop dates in addition to other pertinent events   11/2 presented to ED altered, tachycardic and febrile, concern for sepsis and meningitis, on precedex, got Cefepime, Flagyl, Vanc added acyclovir empirically until LP done and resulted  Interim History / Subjective:    Objective   Blood pressure 119/83, pulse (!) 105, temperature (!) 97 F (36.1 C), temperature source Axillary, resp. rate 18, height '5\' 6"'$  (1.676 m), weight 78 kg, last menstrual period 04/05/2021, SpO2 96 %, unknown if currently breastfeeding.        Intake/Output Summary (Last 24 hours) at 07/20/2022 1114 Last data filed at 07/20/2022 1000 Gross per 24 hour   Intake 5577.28 ml  Output 4845 ml  Net 732.28 ml   Filed Weights   07/19/22 1616 07/20/22 0200  Weight: 77 kg 78 kg    Examination: General: adult female, no distress  HENT: Dry MM  Lungs: clear breath sounds, no use of accessory muscles  Cardiovascular: Tachy, HR 109, no mRG Abdomen: soft, non distended, active bowel sounds  Extremities: no c/c/e no rashes noted anteriorly Neuro: alert, oriented, follows commands GU: intact, external foley in place   Resolved Hospital Problem list     Assessment & Plan:   Acute Encephalopathy CT Head with enlarging sellar/suprasellar mass  Plan  - Obtain MRI  - Consult ID given ME negative however WBC 548  - Will Consult Neurology pending MRI results   Severe Sepsis, lactic acidosis > Resolved  Leukopenia  Plan  -Trend WBC and fever curve -Consult ID  -Continue cefepime, vanc  -D/C acyclovir -Follow Culture Data   s/p Total Thyroidectomy for Graves disease  -cont home levo  Best Practice (right click and "Reselect all SmartList Selections" daily)   Diet/type: Regular consistency (see orders) DVT prophylaxis: SCD GI prophylaxis: N/A Lines: N/A Foley:  Yes, and it is still needed Code Status:  full code Last date of multidisciplinary goals of care discussion family updated at bedside.   Labs   CBC: Recent Labs  Lab 07/19/22 1640 07/20/22 0330  WBC 8.1 3.8*  NEUTROABS 5.1  --   HGB 14.9 11.7*  HCT 44.7 34.5*  MCV 90.3 89.8  PLT 198 134*    Basic Metabolic Panel: Recent Labs  Lab 07/19/22 1640 07/20/22 0330  NA 135 149*  K 4.5 4.3  CL 101 122*  CO2 16* 24  GLUCOSE 94 195*  BUN 12 11  CREATININE 0.94 0.73  CALCIUM 9.4 8.9  MG  --  1.9  PHOS  --  1.6*   GFR: Estimated Creatinine Clearance: 101.5 mL/min (by C-G formula based on SCr of 0.73 mg/dL). Recent Labs  Lab 07/19/22 1640 07/19/22 1916 07/19/22 2230 07/20/22 0016 07/20/22 0330  WBC 8.1  --   --   --  3.8*  LATICACIDVEN 3.1* 5.0* 2.0* 1.1   --     Liver Function Tests: Recent Labs  Lab 07/19/22 1640  AST 29  ALT 21  ALKPHOS 48  BILITOT 2.0*  PROT 7.3  ALBUMIN 3.3*   No results for input(s): "LIPASE", "AMYLASE" in the last 168 hours. No results for input(s): "AMMONIA" in the last 168 hours.  ABG No results found for: "PHART", "PCO2ART", "PO2ART", "HCO3", "TCO2", "ACIDBASEDEF", "O2SAT"   Coagulation Profile: Recent Labs  Lab 07/19/22 1640  INR 1.2    Cardiac Enzymes: Recent Labs  Lab 07/19/22 1640  CKTOTAL 85    HbA1C: Hgb A1c MFr Bld  Date/Time Value Ref Range Status  07/20/2022 12:16 AM 4.2 (L) 4.8 - 5.6 % Final    Comment:    (NOTE) Pre diabetes:          5.7%-6.4%  Diabetes:              >6.4%  Glycemic control for   <7.0% adults with diabetes     CBG: Recent Labs  Lab 07/19/22 2259 07/20/22 0325 07/20/22 0757  GLUCAP 134* 208* 162*    Review of Systems:   Unobtainable 2/2 pt's mental status  Past Medical History:  She,  has a past medical history of Overweight (BMI 25.0-29.9) (06/10/2017) and Thyroid disease (02/21/2015).   Surgical History:   Past Surgical History:  Procedure Laterality Date   THERAPEUTIC ABORTION  06/2020   At 6 weeks--oral meds did not work   THYROIDECTOMY Bilateral 02/21/2015   For Grave's Disease not well controlled on medication     Social History:   reports that she has never smoked. She has never been exposed to tobacco smoke. She has never used smokeless tobacco. She reports that she does not currently use alcohol. She reports that she does not use drugs.   Family History:  Her family history includes ADD / ADHD in her brother; Diabetes in her father and mother; Fibromyalgia in her mother; Heart disease in her mother.   Allergies No Known Allergies   Home Medications  Prior to Admission medications   Medication Sig Start Date End Date Taking? Authorizing Provider  calcium citrate-vitamin D 500-400 MG-UNIT chewable tablet 1 tab by mouth  twice daily. 12/29/20   [name], MD  cetirizine (ZYRTEC) 10 MG tablet Take 1 tablet (10 mg total) by mouth daily. Patient not taking: Reported on 07/17/2022 05/15/19   [name], MD  cyclobenzaprine (FLEXERIL) 5 MG tablet Take 5 mg by mouth at bedtime as needed. 06/30/22   [provider]  levothyroxine (SYNTHROID) 125 MCG tablet TAKE 1 TABLET BY MOUTH EVERY DAY BEFORE BREAKFAST 06/14/22   [name], MD  ondansetron [facility]) 4 MG tablet 1 to 2 tabs by mouth every 8 hours as needed for nausea and vomiting 07/17/22   [name], MD  topiramate (TOPAMAX) 25 MG tablet 1 tab by mouth at bedtime 07/17/22   [name], MD  Time Spent: 45 minutes  Hayden Pedro, AGACNP-BC [facility]  PCCM Pgr: [phone]

## 2022-07-20 NOTE — TOC Progression Note (Signed)
Transition of Care [facility]) - Initial/Assessment Note    Patient Details  Name: Sara Mathis MRN: [id number] Date of Birth: 30-Jan-1986  Transition of Care [facility]) CM/SW Contact:    Milinda Antis, [place] Phone Number: 07/20/2022, 3:53 PM  Clinical Narrative:                  Transition of Care Department [facility]) has reviewed patient.  Patient is from home with roommate, spanish speaking, and admitted for Acute Encephalopathy.  We will continue to monitor patient advancement through interdisciplinary progression rounds.   If new patient transition needs arise, please place a TOC consult.    Patient Goals and CMS Choice        Expected Discharge Plan and Services                                                Prior Living Arrangements/Services                       Activities of Daily Living      Permission Sought/Granted                  Emotional Assessment              Admission diagnosis:  Acute encephalopathy [G93.40] Patient Active Problem List   Diagnosis Date Noted   Acute encephalopathy 07/19/2022   Neck pain 06/14/2022   Stress 06/14/2022   Nonintractable episodic headache 06/14/2022   Ganglion cyst 06/30/2020   Environmental and seasonal allergies 05/15/2019   Acne 07/23/2018   Anxiety 07/23/2018   Depression 07/23/2018   Overweight (BMI 25.0-29.9) 06/10/2017   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   Thyroid disease 09/17/2014   PCP:  [name], MD Pharmacy:   [facility] [phone] - [place], [address] AT [address] [place] [address] Phone: [phone] Fax: [phone]  [facility] - [place], [address] [place] [address] Phone: [phone] Fax: [place], [place] [address] [place] [address] Phone:  [phone] Fax: [phone]  [id number] [place] [id number]: [phone]: [phone]    Social Determinants of Health (SDOH) Interventions    Readmission Risk Interventions     No data to display

## 2022-07-20 NOTE — Progress Notes (Signed)
Pharmacy Antibiotic Note  Sara Mathis is a 36 y.o. female for which pharmacy has been consulted for vancomycin for meningitis.   Patient with a history of migraines, hypothyroidism s/p thyroidectomy.   SCr 0.73 with CrCl > 100 ml/min.     Plan: Increase Vancomycin to 1 gm every 8 hours per nomogram dosing for meningitis  Ceftriaxone 2 gm IV Q 12 hours Metronidazole 500 mg IV Q 8 hours  Monitor renal function, levels at steady state, MRI, cultures   Height: '5\' 6"'$  (167.6 cm) Weight: 78 kg (171 lb 15.3 oz) IBW/kg (Calculated) : 59.3  Temp (24hrs), Avg:97.9 F (36.6 C), Min:93.4 F (34.1 C), Max:103.8 F (39.9 C)  Recent Labs  Lab 07/19/22 1640 07/19/22 1916 07/19/22 2230 07/20/22 0016 07/20/22 0330  WBC 8.1  --   --   --  3.8*  CREATININE 0.94  --   --   --  0.73  LATICACIDVEN 3.1* 5.0* 2.0* 1.1  --     Estimated Creatinine Clearance: 101.5 mL/min (by C-G formula based on SCr of 0.73 mg/dL).    No Known Allergies   Thank you for allowing pharmacy to be a part of this patient's care.  Jimmy Footman, PharmD, BCPS, BCIDP Infectious Diseases Clinical Pharmacist Phone: [phone] 07/20/2022 2:40 PM

## 2022-07-20 NOTE — Consult Note (Addendum)
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  Multiplex CSF PCR has ruled out herpes,neisseria, listeria, ecoli, strep pneumonaie, cryptococcal meningitis, varicella and enterovirus. 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 You can see chemical meningitis with neutrophilic predominance (95%) with pituitary apoplexy. RF = include abn menorrhagia.  Plan: Will change abtx to vancomycin/ceftriaxone/metronidazole for more oral flora coverage for the time being. Will d/c cefepime and acyclovir. Will follow cultures for the next 48hr before deciding to discontinue abtx Recommend getting NSGY input for management, to see if needs surgical intervention.please check estradiol, prolactin, DHEA, am cortisol, thyroid panel, IGF-1. Visual blurriness = continue with Q 4 neuro checks to see if worsening Recommend to taper of steroids, not necessary for meningitis but can consider if at risk for adrenal insufficiency.  Leukopenia = from sepsis. Anticipate to improve Frequent urination = may have central DI. 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 Recommend to get input from neurology and neurosurgery.  ---------------- https://www.acpjournals.org/doi/10.7326/aimcc.2022.1328#:~:text=CSF%45fndings%20in%20pituitary%20apoplexy,causing%20chemical%20meningitis%20(12).  Principal Problem:   Acute encephalopathy    Chlorhexidine Gluconate Cloth  6 each Topical Q0600   dexamethasone (DECADRON) injection  12 mg Intravenous Q12H   insulin aspart  0-9 Units Subcutaneous Q4H   [START ON 07/21/2022] levothyroxine   125 mcg Oral Q0600   mouth rinse  15 mL Mouth Rinse 4 times per day    HPI: Sara YTajah Noguchiis a 36y.o. [place], hx of grave's disease s/p thyroidectomy, Recent pregnancy Feb 2023 with spontaneous abortion at 11w 4d, She has 2 other children.  Most recently, she has had several visits to ER/Hospital for headaches since August 15th of this year. She has reported photophobia, blurry vision, nausea and vomiting. Prior to this she has had headaches in the past that would self resolve reported to other care providers. Has gone to various clinics and ER settings for pain management of headaches. She has tried rx abortive therapy with ibuprofen 800 mg q6h, cyclobenzaprine '5mg'$  QHS, sumatriptan without relief. Described as bitemporal and squeezing/pounding sensation. Started on topiramate for prevention at recent PCP visit 10/25. She has not been feeling well in general. Brought to ER with her roommate private vehicle yesterday after she developed aphasia/AMS. Normal yesterday.    I see 2 prescriptions for amoxicillin from August 29, 23 and Sept 5, 23 that appear to be rx'd by dentists.   On arrival she had high fever 104 F and was confused/agitated. Found to have leukopenia, with lactic acidosis of 5. UDS negative, started on sepsis protocol. Due to encephalopathy, hx of HA, she underwent lumbar puncture showing neutrophilic predominance pleocytosis with low glucose, mildly elevated protein. Gram stain negative on CSF. Non contrast head ct showing new suprasellar lesion measuring 2 cm by 1.9 cm by 1.4cm new since August imaging. She was empirically started on vanco/cefepime/metronidazole/acyclovir. CXR did not show any infiltrates LP Micro/cyto: WBC 548 Glu 27 Pr 162 ME Multiplex - NEGATIVE  Although she was encephalopathic/possibly post ictal, 24hrs ago, now she is returned back to baseline. Does have bilateral blurry vision, no  deficit on  visual fields, mild bilateral numbness to cheks. No motor deficits.   Denies dental issues. Has had headache, abn menorrhagia, blurry vision in the last few months.   Review of Systems:unable to obtain due to encephalopathy   Past Medical History:  Diagnosis Date   Overweight (BMI 25.0-29.9) 06/10/2017   Thyroid disease 02/21/2015   Grave's disease resulting in total thyroidectomy    Social History   Tobacco Use   Smoking status: Never    Passive exposure: Never   Smokeless tobacco: Never  Vaping Use   Vaping Use: Never used  Substance Use Topics   Alcohol use: Not Currently    Alcohol/week: 0.0 standard drinks of alcohol    Comment: whiskey or tequila shot maybe twice monthly   Drug use: No    Family History  Problem Relation Age of Onset   Diabetes Mother    Fibromyalgia Mother    Heart disease Mother        age 81 and 22   Diabetes Father    ADD / ADHD Brother    No Known Allergies  OBJECTIVE: Blood pressure 119/83, pulse (!) 105, temperature 98.6 F (37 C), temperature source Axillary, resp. rate 18, height '5\' 6"'$  (1.676 m), weight 78 kg, last menstrual period 04/05/2021, SpO2 96 %, unknown if currently breastfeeding. Physical Exam  Constitutional:  oriented to person, place, and time. appears well-developed and well-nourished. No distress.  HENT: [place]/AT, PERRLA, no scleral icterus Mouth/Throat: Oropharynx is clear and moist. No oropharyngeal exudate.  Cardiovascular: Normal rate, regular rhythm and normal heart sounds. Exam reveals no gallop and no friction rub.  No murmur heard.  Pulmonary/Chest: Effort normal and breath sounds normal. No respiratory distress.  has no wheezes.  Neck = supple, mild nuchal rigidity Abdominal: Soft. Bowel sounds are normal.  exhibits no distension. There is no tenderness.  Lymphadenopathy: no cervical adenopathy. No axillary adenopathy Neurological: alert and oriented to person, place, and time. Bilateral numbness of  cheek(V2-V3) only.  Skin: Skin is warm and dry. No rash noted. No erythema.  Psychiatric: a normal mood and affect.  behavior is normal.    Lab Results Lab Results  Component Value Date   WBC 3.8 (L) 07/20/2022   HGB 11.7 (L) 07/20/2022   HCT 34.5 (L) 07/20/2022   MCV 89.8 07/20/2022   PLT 134 (L) 07/20/2022    Lab Results  Component Value Date   CREATININE 0.73 07/20/2022   BUN 11 07/20/2022   NA 149 (H) 07/20/2022   K 4.3 07/20/2022   CL 122 (H) 07/20/2022   CO2 24 07/20/2022    Lab Results  Component Value Date   ALT 21 07/19/2022   AST 29 07/19/2022   ALKPHOS 48 07/19/2022   BILITOT 2.0 (H) 07/19/2022     Microbiology: Recent Results (from the past 240 hour(s))  Resp Panel by RT-PCR (Flu A&B, Covid) Anterior Nasal Swab     Status: None   Collection Time: 07/19/22  4:12 PM   Specimen: Anterior Nasal Swab  Result Value Ref Range Status   SARS Coronavirus 2 by RT PCR NEGATIVE NEGATIVE Final    Comment: (NOTE) SARS-CoV-2 target nucleic acids are NOT DETECTED.  The SARS-CoV-2 RNA is generally detectable in upper respiratory specimens during the acute phase of infection. The lowest concentration of SARS-CoV-2 viral copies this assay can detect is 138 copies/mL. A negative result does not preclude SARS-Cov-2 infection and should not be used as the sole basis for treatment or other patient management decisions.  A negative result may occur with  improper specimen collection/handling, submission of specimen other than nasopharyngeal swab, presence of viral mutation(s) within the areas targeted by this assay, and inadequate number of viral copies(<138 copies/mL). A negative result must be combined with clinical observations, patient history, and epidemiological information. The expected result is Negative.  Fact Sheet for Patients:  EntrepreneurPulse.com.au  Fact Sheet for Healthcare Providers:  IncredibleEmployment.be  This test  is no t yet approved or cleared by the [place] FDA and  has been authorized for detection and/or diagnosis of SARS-CoV-2 by FDA under an Emergency Use Authorization (EUA). This EUA will remain  in effect (meaning this test can be used) for the duration of the COVID-19 declaration under Section 564(b)(1) of the Act, 21 U.S.C.section 360bbb-3(b)(1), unless the authorization is terminated  or revoked sooner.       Influenza A by PCR NEGATIVE NEGATIVE Final   Influenza B by PCR NEGATIVE NEGATIVE Final    Comment: (NOTE) The Xpert Xpress SARS-CoV-2/FLU/RSV plus assay is intended as an aid in the diagnosis of influenza from Nasopharyngeal swab specimens and should not be used as a sole basis for treatment. Nasal washings and aspirates are unacceptable for Xpert Xpress SARS-CoV-2/FLU/RSV testing.  Fact Sheet for Patients: EntrepreneurPulse.com.au  Fact Sheet for Healthcare Providers: IncredibleEmployment.be  This test is not yet approved or cleared by the [place] FDA and has been authorized for detection and/or diagnosis of SARS-CoV-2 by FDA under an Emergency Use Authorization (EUA). This EUA will remain in effect (meaning this test can be used) for the duration of the COVID-19 declaration under Section 564(b)(1) of the Act, 21 U.S.C. section 360bbb-3(b)(1), unless the authorization is terminated or revoked.  Performed at [facility], [place] [address] [place], [place] [address]   Blood Culture (routine x 2)     Status: None (Preliminary result)   Collection Time: 07/19/22  4:38 PM   Specimen: BLOOD LEFT HAND  Result Value Ref Range Status   Specimen Description BLOOD LEFT HAND  Final   Special Requests   Final    BOTTLES DRAWN AEROBIC AND ANAEROBIC Blood Culture results may not be optimal due to an inadequate volume of blood received in culture bottles   Culture   Final    NO GROWTH < 24 HOURS Performed at [facility] [place], [place] [address] [place], [place] [address]    Report Status PENDING  Incomplete  Blood Culture (routine x 2)     Status: None (Preliminary result)   Collection Time: 07/19/22  4:40 PM   Specimen: BLOOD  Result Value Ref Range Status   Specimen Description BLOOD BLOOD RIGHT FOREARM  Final   Special Requests   Final    BOTTLES DRAWN AEROBIC AND ANAEROBIC Blood Culture adequate volume   Culture   Final    NO GROWTH < 24 HOURS Performed at [facility], [place] [address] [place], [place] [address]    Report Status PENDING  Incomplete  CSF culture     Status: None (Preliminary result)   Collection Time: 07/19/22  6:59 PM   Specimen: CSF; Cerebrospinal Fluid  Result Value Ref Range Status   Specimen Description CSF  Final   Special Requests NONE  Final   Gram Stain   Final    WBC PRESENT, PREDOMINANTLY PMN NO ORGANISMS SEEN CYTOSPIN SMEAR    Culture   Final    NO GROWTH < 24 HOURS Performed at [facility],  [address], [place], [place] [address]    Report Status PENDING  Incomplete    Elzie Rings. [facility] [phone]  07/20/2022 12:35 PM

## 2022-07-20 NOTE — Progress Notes (Signed)
[facility] Progress Note Patient Name: Sara Mathis DOB: 04/09/1986 MRN: [id number]   Date of Service  07/20/2022  HPI/Events of Note  2030 potassium 2.8 and sodium 151.  UOP not completely accurate according to nurse.  LR recently started at 125 cc/hr.  Patient drinking significant amount of fluid.  eICU Interventions  Potassium replaced Continue serial measurements of sodium and urine osm     Intervention Category Intermediate Interventions: Electrolyte abnormality - evaluation and management  Mauri Brooklyn, P 07/20/2022, 10:11 PM

## 2022-07-20 NOTE — Consult Note (Addendum)
Neurology Consultation  Reason for Consult: Altered mental status   Referring Physician: Dr. Shearon Stalls  CC: Blurry vision, dizziness  History is obtained from:Chart review, patient   HPI: Sara Mathis is a 36 y.o. female with a PMHx significant for migraines and hypothyroidism following surgery for Grave's disease 7 years ago who presented to the ED with acute altered mental status.  Per her roommate, she was in her normal state of health 11/1, then stopped talking and following commands the next day.  She has had increased migraine headaches for the last month.  She was initially febrile to 103.8 F, tachycardic and agitated.  Initial labs showed lactic acid of 3.1, no leukocytosis, TSH 0.065, normal ethanol, UDS pending.  UA and CXR without clear source of infection.  She was given 3L IVF, broad spectrum abx and head CT/LP pending.  Her agitation required precedex infusion, so PCCM consulted for admission   Upon evaluation pt is arousable to painful stim, no sonorous respirations and appears to be protecting airway. Laying completely flat supine on RA with adequate sats. She provides no history.  Upon evaluation, patient is at bedside, resting comfortably.  Appears lethargic but is able to answer questions, is oriented and cooperative. Her friend is present, who assists in answering questions. Patient reports that she has not menstruated since June, with accompanied blurry vision and GI upset at that time. She also mentions she experiences pressure-like headaches often. Patient's friend reports that patient was combative and her speech was unintelligible during her encephalopathic state. She is not able to recall the events from the day prior, friend reports her mentation began to return to baseline at 10 PM last night and was finally able to recognize him at 5 AM. On evaluation she continues ot endorse blurry vision, a mild headache, and dizziness. States she only becomes nauseous when  she tries to eat. Denies any hx of seizures.   ROS: Full ROS was performed and is negative except as noted in the HPI.   Past Medical History:  Diagnosis Date   Overweight (BMI 25.0-29.9) 06/10/2017   Thyroid disease 02/21/2015   Grave's disease resulting in total thyroidectomy    Family History  Problem Relation Age of Onset   Diabetes Mother    Fibromyalgia Mother    Heart disease Mother        age 101 and 98   Diabetes Father    ADD / ADHD Brother     Social History:   reports that she has never smoked. She has never been exposed to tobacco smoke. She has never used smokeless tobacco. She reports that she does not currently use alcohol. She reports that she does not use drugs.  Medications  Current Facility-Administered Medications:    0.9 %  sodium chloride infusion, , Intravenous, PRN, Spero Geralds, MD, Last Rate: 10 mL/hr at 07/20/22 1614, New Bag at 07/20/22 1614   acetaminophen (TYLENOL) suppository 650 mg, 650 mg, Rectal, Q4H PRN, Audria Nine, DO   acetaminophen (TYLENOL) tablet 650 mg, 650 mg, Oral, Q4H PRN, Audria Nine, DO, 650 mg at 07/20/22 1006   cefTRIAXone (ROCEPHIN) 2 g in sodium chloride 0.9 % 100 mL IVPB, 2 g, Intravenous, Q12H, Carlyle Basques, MD, Last Rate: 200 mL/hr at 07/20/22 1616, 2 g at 07/20/22 1616   Chlorhexidine Gluconate Cloth 2 % PADS 6 each, 6 each, Topical, Q0600, Audria Nine, DO, 6 each at 07/19/22 2305   dexamethasone (DECADRON) injection 12 mg, 12 mg, Intravenous, Q12H,  Judd Lien, MD, 12 mg at 07/20/22 1010   docusate sodium (COLACE) capsule 100 mg, 100 mg, Oral, BID PRN, Audria Nine, DO   [START ON 07/21/2022] enoxaparin (LOVENOX) injection 40 mg, 40 mg, Subcutaneous, Q24H, Shearon Stalls, Senaida Ores, MD   insulin aspart (novoLOG) injection 0-9 Units, 0-9 Units, Subcutaneous, Q4H, Judd Lien, MD, 2 Units at 07/20/22 0923   [START ON 07/21/2022] levothyroxine (SYNTHROID) tablet 125 mcg, 125 mcg, Oral, Q0600,  Omar Person, NP   metroNIDAZOLE (FLAGYL) IVPB 500 mg, 500 mg, Intravenous, Q8H, Carlyle Basques, MD, Last Rate: 100 mL/hr at 07/20/22 1623, 500 mg at 07/20/22 1623   Oral care mouth rinse, 15 mL, Mouth Rinse, 4 times per day, Audria Nine, DO, 15 mL at 07/20/22 1617   Oral care mouth rinse, 15 mL, Mouth Rinse, PRN, Audria Nine, DO   polyethylene glycol (MIRALAX / GLYCOLAX) packet 17 g, 17 g, Oral, Daily PRN, Audria Nine, DO   vancomycin (VANCOCIN) IVPB 1000 mg/200 mL premix, 1,000 mg, Intravenous, Q8H, Carlyle Basques, MD   Exam: Current vital signs: BP (!) 129/99   Pulse 98   Temp 98.1 F (36.7 C) (Axillary)   Resp 16   Ht '5\' 6"'$  (1.676 m)   Wt 78 kg   LMP 04/05/2021 (Approximate) Comment: regular  SpO2 95%   BMI 27.75 kg/m  Vital signs in last 24 hours: Temp:  [93.4 F (34.1 C)-101.6 F (38.7 C)] 98.1 F (36.7 C) (11/03 1548) Pulse Rate:  [45-166] 98 (11/03 1600) Resp:  [10-31] 16 (11/03 1600) BP: (77-129)/(31-99) 129/99 (11/03 1600) SpO2:  [84 %-100 %] 95 % (11/03 1600) Weight:  [78 kg] 78 kg (11/03 0200)  GENERAL: Awake, alert in NAD HEENT: - Normocephalic and atraumatic, dry mm, no LN++, no Thyromegally LUNGS - Clear to auscultation bilaterally with no wheezes CV - S1S2 RRR, no m/r/g, equal pulses bilaterally. ABDOMEN - Soft, nontender, nondistended with normoactive BS Ext: warm, well perfused, intact peripheral pulses, no edema  NEURO:  Mental Status: AA&Ox4 Language: speech is clear but slow and mildly hypophonic.  Naming, repetition, fluency, and comprehension intact. Cranial Nerves: PERRL mm/brisk. EOMI, visual fields full, no facial asymmetry, facial sensation intact, hearing intact, tongue/uvula/soft palate midline, normal sternocleidomastoid and trapezius muscle strength. No evidence of tongue atrophy  Motor: Moves all extremities equally, strength intact 5/5 Tone: is normal and bulk is normal Sensation- Intact to light touch  bilaterally in UE and LE Coordination: FTN intact bilaterally, no ataxia in BLE. Gait- deferred Other - Brudzinski and Kernig responses are equivocal, patient endorses pain when performing this exam but does not appear acutely in distress.   Labs I have reviewed labs in epic and the results pertinent to this consultation are:  CSF: clear; glucose 27 , WBC 548/348, segmented neutrophils 88/82, lymphs 10/16, protein 162  CBC    Component Value Date/Time   WBC 3.8 (L) 07/20/2022 0330   RBC 3.84 (L) 07/20/2022 0330   HGB 11.7 (L) 07/20/2022 0330   HGB 13.1 02/27/2021 0941   HCT 34.5 (L) 07/20/2022 0330   HCT 40.9 02/27/2021 0941   PLT 134 (L) 07/20/2022 0330   PLT 181 02/27/2021 0941   MCV 89.8 07/20/2022 0330   MCV 97 02/27/2021 0941   MCH 30.5 07/20/2022 0330   MCHC 33.9 07/20/2022 0330   RDW 11.6 07/20/2022 0330   RDW 11.5 (L) 02/27/2021 0941   LYMPHSABS 2.2 07/19/2022 1640   LYMPHSABS 1.5 02/27/2021 0941   MONOABS 0.7 07/19/2022 1640  EOSABS 0.0 07/19/2022 1640   EOSABS 0.0 02/27/2021 0941   BASOSABS 0.0 07/19/2022 1640   BASOSABS 0.0 02/27/2021 0941    CMP     Component Value Date/Time   NA 149 (H) 07/20/2022 0330   NA 142 02/27/2021 0941   K 4.3 07/20/2022 0330   CL 122 (H) 07/20/2022 0330   CO2 24 07/20/2022 0330   GLUCOSE 195 (H) 07/20/2022 0330   BUN 11 07/20/2022 0330   BUN 14 02/27/2021 0941   CREATININE 0.73 07/20/2022 0330   CALCIUM 8.9 07/20/2022 0330   PROT 7.3 07/19/2022 1640   PROT 6.3 02/27/2021 0941   ALBUMIN 3.3 (L) 07/19/2022 1640   ALBUMIN 4.0 02/27/2021 0941   AST 29 07/19/2022 1640   ALT 21 07/19/2022 1640   ALKPHOS 48 07/19/2022 1640   BILITOT 2.0 (H) 07/19/2022 1640   BILITOT 0.3 02/27/2021 0941   GFRNONAA >60 07/20/2022 0330   GFRAA 97 03/31/2018 1542    Lipid Panel     Component Value Date/Time   CHOL 174 02/27/2021 0941   TRIG 83 02/27/2021 0941   HDL 59 02/27/2021 0941   LDLCALC 100 (H) 02/27/2021 0941     Imaging I  have reviewed the images obtained:  CT-head 1. No acute intracranial hemorrhage. 2. Enlarging sellar/suprasellar mass. Further evaluation with pituitary mass protocol MRI recommended.  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 There are apparent chronic blood products along the periphery of this lesion. Favored differential considerations include a cystic/necrotic pituitary macroadenoma, sequela of pituitary infarct/hemorrhage (as can be seen in the setting of pituitary apoplexy) or large Rathke's cyst (with prior hemorrhage). Craniopharyngioma is considered less likely as these lesions are not typically cystic in adult patients. The lesion encroaches upon the optic apparatus. No evidence of cavernous sinus extension.  Assessment: 36 yo female with PMHx of significant for migraines, hypothyroidism, and reported 5 month history of amenorrhea presenting to [facility] for acute altered mental status and admitted for work up of her encephalopathy. 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  - Her mentation has subjectively improved but has not completely returned to baseline and she continues to experience headaches, blurry vision, and dizziness.  - On exam she is alert, awake, and oriented x 4, with no appreciably acute focal deficits. She endorses blurry vision, but no deficits in her peripheral vision or other blind spots. - UDS negative, EtOH and salicylate negative - CSF: Clear, colorless, RBC 24, glucose 27, WBC 419-379 with neutrophilic predominance, protein 162, viral/bacterial PCR panel negative, initial microscopic examination negative for organisms, no growth x 24 hours - MRI brain w/wo contrast: 1.1 x 1.8 x 1.9 cm centrally cystic and peripherally enhancing  lesion occupying the sella turcica and extending into the suprasellar cistern. There are apparent chronic blood products along the periphery of this lesion. Favored differential considerations include a cystic/necrotic pituitary macroadenoma, sequela of pituitary infarct/hemorrhage (as can be seen in the setting of pituitary apoplexy) or large Rathke's cyst (with prior hemorrhage). Craniopharyngioma is considered less likely as these lesions are not typically cystic in adult patients. The lesion encroaches upon the optic apparatus. No evidence of cavernous sinus extension.  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   - Meningitis based on CSF findings, not entirely consistent given negative PCR.  - Autoimmune encephalopathy, less likely at the time but would require IgG to rule out.  - Lymphocytic hypophysitis is an intriguing possibility in this case given the overall presentation and radiological features.  - Cystic/necrotic pituitary macroadenoma versus pituitary hemorrhage with inflammatory response, as documented in the Radiology report, are also relatively high on the DDx. Also possible but less likely would be craniopharyngioma.  - Somewhat decreasing the likelihood of pituitary apoplexy is the gradually worsening course this patient has experienced over 5 months since onset of her first symptom of amenorrhea. Additionally, the cavitary-appearing pituitary mass lesion appears to have enlarged in the time interval between the current CT scan and the prior CT dated May 02, 2022, at which time a smaller cavitary lesion is apparent and best seen on the coronal images.   - Regarding the possibility of lymphocytic hypophysitis, per literature search: "Lymphocytic hypophysitis is a rare pathology of the pituitary gland which presents with features of hypopituitarism due to inflammation of  the pituitary gland and/or a sellar mass lesion. It consists of the infiltration of the pituitary gland by T and B lymphocytes. It is an autoimmune condition and is the most frequent histopathological subtype of primary hypophysitis. Endocrine symptoms are due to pituitary dysfunction and can include central diabetes insipidus, anterior pituitary hormone deficiencies, and hyperprolactinemia, or hyperprolactinemia. Given that it is relatively rare, diagnosis and treatment can be challenging." Treatment involves the following: "The main goal of the treatment of lymphocytic hypophysitis is to manage any pituitary hormone deficiencies and to manage any mass-like effect that may occur due to pituitary gland enlargement. This treatment can consist of conservative management and the use of anti-inflammatory agents. Surgery or radiotherapy is rarely used. However, first and foremost, the pituitary function needs to be assessed and any pituitary hormone deficiencies need to be managed. They should also be assessed for diabetes insipidus and treated accordingly if necessary. While there can be spontaneous resolution or regression of lymphocytic hypophysitis, if there is a high suspicion of lymphocytic hypophysitis steroid therapy is the mainline treatment of this condition. However, if vision is affected, or if there is compressive mass like signs affecting nearby vital structures or if histology is needed to confirm the diagnosis, surgery becomes the main line of treatment. At times, patients require long term hormone replacement therapy, however, corticosteroid therapy, which has an anti-inflammatory response and can decrease the size of the mass lesion, can help patients regain pituitary function and decrease the need to be on lifelong replacement hormone therapy if recurrence after treatment does not occur. There is no consensus on the appropriate dosage and duration of glucocorticoid therapy and both these vary. One  study in [place] had 32 patients treated with 20 to 500 mg daily of prednisolone equivalent glucocorticoids for a duration range of 4 days to a year."  Recommendations: - Recommend Endocrinology consult given likelihood of derangement of hormones of pituitary origin, which will need specialized testing to further assess - Recommend Neurosurgery consult for evaluation of pituitary cyst - Continue abx per ID recommendations - May need Rheumatology consult or subspecialty input from a large academic center such as [facility]  Participating in this evaluation: Christene Slates, MD PGY-1   I have seen and examined the patient. I have formulated the assessment and recommendations. Please see detailed description of exam, DDx and further diagnostic and potential treatment  options as described above.  Electronically signed: Dr. Kerney Elbe  Electronically signed: Dr. Kerney Elbe

## 2022-07-20 NOTE — Progress Notes (Signed)
[facility] ADULT ICU REPLACEMENT PROTOCOL   The patient does apply for the [facility] Adult ICU Electrolyte Replacment Protocol based on the criteria listed below:   1.Exclusion criteria: TCTS patients, ECMO patients, and Dialysis patients 2. Is GFR >/= 30 ml/min? Yes.    Patient's GFR today is >60 3. Is SCr </= 2? Yes.   Patient's SCr is 0.73 mg/dL 4. Did SCr increase >/= 0.5 in 24 hours? No. 5.Pt's weight >40kg  Yes.   6. Abnormal electrolyte(s):   Phos 1.6, Mg 1.9  7. Electrolytes replaced per protocol 8.  Call MD STAT for K+ </= 2.5, Phos </= 1, or Mag </= 1 Physician:  E. Verdie Shire R Salih Williamson 07/20/2022 6:01 AM

## 2022-07-20 NOTE — Progress Notes (Signed)
eLink Physician-Brief Progress Note Patient Name: Sara Mathis DOB: 02/04/1986 MRN: [id number]   Date of Service  07/20/2022  HPI/Events of Note  Temp 93.4 rectal Cultures NGTD  eICU Interventions  Bair hugger ordered     Intervention Category Minor Interventions: Routine modifications to care plan (e.g. PRN medications for pain, fever)  Sara Mathis Sara Mathis 07/20/2022, 4:24 AM

## 2022-07-20 NOTE — Consult Note (Signed)
Reason for Consult: Pituitary apoplexy Referring Physician: Dr. Lutricia Feil  [facility] Sara Mathis is an 36 y.o. female.  HPI: Patient is a 36 year old right-handed visuals been having headaches for several weeks to months time.  She had a sudden change in her level of consciousness yesterday and became febrile 203.  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  It does extend up to the optic chiasm creating a bit of mass effect on it.  The patient has been evaluated by infectious diseases and has been on some antibiotic coverage.  It appears that she is neutropenic and there is some concern for the possibility of infection.  Patient notes now that she is photophobic but does not admit to any difficulty with her vision other than some blurriness.  Past Medical History:  Diagnosis Date   Overweight (BMI 25.0-29.9) 06/10/2017   Thyroid disease 02/21/2015   Grave's disease resulting in total thyroidectomy    Past Surgical History:  Procedure Laterality Date   THERAPEUTIC ABORTION  06/2020   At 6 weeks--oral meds did not work   THYROIDECTOMY Bilateral 02/21/2015   For Grave's Disease not well controlled on medication    Family History  Problem Relation Age of Onset   Diabetes Mother    Fibromyalgia Mother    Heart disease Mother        age 86 and 81   Diabetes Father    ADD / ADHD Brother     Social History:  reports that she has never smoked. She has never been exposed to tobacco smoke. She has never used smokeless tobacco. She reports that she does not currently use alcohol. She reports that she does not use drugs.  Allergies: No Known Allergies  Medications: I have not reviewed the patient's medications  Results for orders placed or performed during the hospital encounter of 07/19/22 (from the past 48 hour(s))  Resp Panel by RT-PCR (Flu A&B, Covid) Anterior Nasal Swab      Status: None   Collection Time: 07/19/22  4:12 PM   Specimen: Anterior Nasal Swab  Result Value Ref Range   SARS Coronavirus 2 by RT PCR NEGATIVE NEGATIVE    Comment: (NOTE) SARS-CoV-2 target nucleic acids are NOT DETECTED.  The SARS-CoV-2 RNA is generally detectable in upper respiratory specimens during the acute phase of infection. The lowest concentration of SARS-CoV-2 viral copies this assay can detect is 138 copies/mL. A negative result does not preclude SARS-Cov-2 infection and should not be used as the sole basis for treatment or other patient management decisions. A negative result may occur with  improper specimen collection/handling, submission of specimen other than nasopharyngeal swab, presence of viral mutation(s) within the areas targeted by this assay, and inadequate number of viral copies(<138 copies/mL). A negative result must be combined with clinical observations, patient history, and epidemiological information. The expected result is Negative.  Fact Sheet for Patients:  EntrepreneurPulse.com.au  Fact Sheet for Healthcare Providers:  IncredibleEmployment.be  This test is no t yet approved or cleared by the [place] FDA and  has been authorized for detection and/or diagnosis of SARS-CoV-2 by FDA under an Emergency Use Authorization (EUA). This EUA will remain  in effect (meaning this test can be used) for the duration of the COVID-19 declaration under Section 564(b)(1) of the Act, 21 U.S.C.section 360bbb-3(b)(1), unless the authorization is terminated  or revoked sooner.  Influenza A by PCR NEGATIVE NEGATIVE   Influenza B by PCR NEGATIVE NEGATIVE    Comment: (NOTE) The Xpert Xpress SARS-CoV-2/FLU/RSV plus assay is intended as an aid in the diagnosis of influenza from Nasopharyngeal swab specimens and should not be used as a sole basis for treatment. Nasal washings and aspirates are unacceptable for Xpert Xpress  SARS-CoV-2/FLU/RSV testing.  Fact Sheet for Patients: EntrepreneurPulse.com.au  Fact Sheet for Healthcare Providers: IncredibleEmployment.be  This test is not yet approved or cleared by the [place] FDA and has been authorized for detection and/or diagnosis of SARS-CoV-2 by FDA under an Emergency Use Authorization (EUA). This EUA will remain in effect (meaning this test can be used) for the duration of the COVID-19 declaration under Section 564(b)(1) of the Act, 21 U.S.C. section 360bbb-3(b)(1), unless the authorization is terminated or revoked.  Performed at [facility], [place] [address], [place], [place] [address]   Blood Culture (routine x 2)     Status: None (Preliminary result)   Collection Time: 07/19/22  4:38 PM   Specimen: BLOOD LEFT HAND  Result Value Ref Range   Specimen Description BLOOD LEFT HAND    Special Requests      BOTTLES DRAWN AEROBIC AND ANAEROBIC Blood Culture results may not be optimal due to an inadequate volume of blood received in culture bottles   Culture      NO GROWTH < 24 HOURS Performed at [facility] [address], [place], [place] [address]    Report Status PENDING   Lactic acid, plasma     Status: Abnormal   Collection Time: 07/19/22  4:40 PM  Result Value Ref Range   Lactic Acid, Venous 3.1 (HH) 0.5 - 1.9 mmol/L    Comment: CRITICAL RESULT CALLED TO, READ BACK BY AND VERIFIED WITH Albina Billet SWORD RN 07/19/22 1752 Wiliam Ke Performed at [facility], [place] [address], [place], [place] [address]   Comprehensive metabolic panel     Status: Abnormal   Collection Time: 07/19/22  4:40 PM  Result Value Ref Range   Sodium 135 135 - 145 mmol/L   Potassium 4.5 3.5 - 5.1 mmol/L   Chloride 101 98 - 111 mmol/L   CO2 16 (L) 22 - 32 mmol/L   Glucose, Bld 94 70 - 99 mg/dL    Comment: Glucose reference range applies only to samples taken after fasting for at least 8 hours.   BUN 12 6 - 20 mg/dL    Creatinine, Ser 0.94 0.44 - 1.00 mg/dL   Calcium 9.4 8.9 - 10.3 mg/dL   Total Protein 7.3 6.5 - 8.1 g/dL   Albumin 3.3 (L) 3.5 - 5.0 g/dL   AST 29 15 - 41 U/L   ALT 21 0 - 44 U/L   Alkaline Phosphatase 48 38 - 126 U/L   Total Bilirubin 2.0 (H) 0.3 - 1.2 mg/dL   GFR, Estimated >60 >60 mL/min    Comment: (NOTE) Calculated using the CKD-EPI Creatinine Equation (2021)    Anion gap 18 (H) 5 - 15    Comment: Performed at [facility], [place] [address] [place], [place] [address]  CBC with Differential     Status: None   Collection Time: 07/19/22  4:40 PM  Result Value Ref Range   WBC 8.1 4.0 - 10.5 K/uL   RBC 4.95 3.87 - 5.11 MIL/uL   Hemoglobin 14.9 12.0 - 15.0 g/dL   HCT 44.7 36.0 - 46.0 %   MCV  90.3 80.0 - 100.0 fL   MCH 30.1 26.0 - 34.0 pg   MCHC 33.3 30.0 - 36.0 g/dL   RDW 11.6 11.5 - 15.5 %   Platelets 198 150 - 400 K/uL   nRBC 0.0 0.0 - 0.2 %   Neutrophils Relative % 64 %   Neutro Abs 5.1 1.7 - 7.7 K/uL   Lymphocytes Relative 27 %   Lymphs Abs 2.2 0.7 - 4.0 K/uL   Monocytes Relative 9 %   Monocytes Absolute 0.7 0.1 - 1.0 K/uL   Eosinophils Relative 0 %   Eosinophils Absolute 0.0 0.0 - 0.5 K/uL   Basophils Relative 0 %   Basophils Absolute 0.0 0.0 - 0.1 K/uL   Immature Granulocytes 0 %   Abs Immature Granulocytes 0.02 0.00 - 0.07 K/uL    Comment: Performed at [facility] [address], [place], [place] [address]  Protime-INR     Status: None   Collection Time: 07/19/22  4:40 PM  Result Value Ref Range   Prothrombin Time 14.9 11.4 - 15.2 seconds   INR 1.2 0.8 - 1.2    Comment: (NOTE) INR goal varies based on device and disease states. Performed at [facility], [place] [address] [place], [place] [address]   APTT     Status: None   Collection Time: 07/19/22  4:40 PM  Result Value Ref Range   aPTT 34 24 - 36 seconds    Comment: Performed at [facility] [address], [place], [place] [address]  Blood Culture (routine x 2)     Status:  None (Preliminary result)   Collection Time: 07/19/22  4:40 PM   Specimen: BLOOD  Result Value Ref Range   Specimen Description BLOOD BLOOD RIGHT FOREARM    Special Requests      BOTTLES DRAWN AEROBIC AND ANAEROBIC Blood Culture adequate volume   Culture      NO GROWTH < 24 HOURS Performed at [facility], [place] [address], [place], [place] [address]    Report Status PENDING   Troponin I (High Sensitivity)     Status: None   Collection Time: 07/19/22  4:40 PM  Result Value Ref Range   Troponin I (High Sensitivity) 9 <18 ng/L    Comment: (NOTE) Elevated high sensitivity troponin I (hsTnI) values and significant  changes across serial measurements may suggest ACS but many other  chronic and acute conditions are known to elevate hsTnI results.  Refer to the "Links" section for chest pain algorithms and additional  guidance. Performed at [facility], [place] [address] [place], [place] [address]   CK     Status: None   Collection Time: 07/19/22  4:40 PM  Result Value Ref Range   Total CK 85 38 - 234 U/L    Comment: Performed at [facility], [place] [address], [place], [place] [address]  TSH     Status: Abnormal   Collection Time: 07/19/22  4:40 PM  Result Value Ref Range   TSH 0.065 (L) 0.350 - 4.500 uIU/mL    Comment: Performed by a 3rd Generation assay with a functional sensitivity of <=0.01 uIU/mL. Performed at [facility], [place] [address], [place], [place] [address]   Ethanol     Status: None   Collection Time: 07/19/22  4:40 PM  Result Value Ref Range   Alcohol, Ethyl (B) <10 <10 mg/dL    Comment: (NOTE) Lowest detectable limit for  serum alcohol is 10 mg/dL.  For medical purposes only. Performed at [facility], [place] [address], [place], [place] [address]   Salicylate level     Status: Abnormal   Collection Time: 07/19/22  4:40 PM  Result Value Ref Range   Salicylate Lvl <0.3 (L) 7.0 - 30.0 mg/dL    Comment: Performed at [facility] [address], [place], [place] [address]  Acetaminophen level     Status: Abnormal   Collection Time: 07/19/22  4:40 PM  Result Value Ref Range   Acetaminophen (Tylenol), Serum <10 (L) 10 - 30 ug/mL    Comment: (NOTE) Therapeutic concentrations vary significantly. A range of 10-30 ug/mL  may be an effective concentration for many patients. However, some  are best treated at concentrations outside of this range. Acetaminophen concentrations >150 ug/mL at 4 hours after ingestion  and >50 ug/mL at 12 hours after ingestion are often associated with  toxic reactions.  Performed at [facility], [place] [address], [place], [place] [address]   I-Stat beta hCG blood, ED     Status: None   Collection Time: 07/19/22  4:51 PM  Result Value Ref Range   I-stat hCG, quantitative <5.0 <5 mIU/mL   Comment 3            Comment:   GEST. AGE      CONC.  (mIU/mL)   <=1 WEEK        5 - 50     2 WEEKS       50 - 500     3 WEEKS       100 - 10,000     4 WEEKS     1,000 - 30,000        FEMALE AND NON-PREGNANT FEMALE:     LESS THAN 5 mIU/mL   Urinalysis, Routine w reflex microscopic In/Out Cath Urine     Status: Abnormal   Collection Time: 07/19/22  5:06 PM  Result Value Ref Range   Color, Urine AMBER (A) YELLOW    Comment: BIOCHEMICALS MAY BE AFFECTED BY COLOR   APPearance CLEAR CLEAR   Specific Gravity, Urine 1.018 1.005 - 1.030   pH 5.0 5.0 - 8.0   Glucose, UA NEGATIVE NEGATIVE mg/dL   Hgb urine dipstick SMALL (A) NEGATIVE   Bilirubin Urine NEGATIVE NEGATIVE   Ketones, ur 80 (A) NEGATIVE mg/dL   Protein, ur NEGATIVE NEGATIVE mg/dL   Nitrite NEGATIVE NEGATIVE   Leukocytes,Ua NEGATIVE NEGATIVE   RBC / HPF 0-5 0 - 5 RBC/hpf   WBC, UA 0-5 0 - 5 WBC/hpf   Bacteria, UA RARE (A) NONE SEEN   Squamous Epithelial / LPF 0-5 0 - 5   Mucus PRESENT     Comment: Performed at [facility], [place] [address] [place],  [address]  Urine Culture     Status: None   Collection Time: 07/19/22   5:06 PM   Specimen: In/Out Cath Urine  Result Value Ref Range   Specimen Description IN/OUT CATH URINE    Special Requests NONE    Culture      NO GROWTH Performed at [facility], [place] [address] [place],  [address]    Report Status 07/20/2022 FINAL   CSF cell count with differential collection tube #: 1     Status: Abnormal   Collection Time: 07/19/22  6:59 PM  Result Value Ref Range   Tube # 1  Color, CSF COLORLESS COLORLESS   Appearance, CSF CLEAR (A) CLEAR   Supernatant NOT INDICATED    RBC Count, CSF 30 (H) 0 /cu mm   WBC, CSF 348 (HH) 0 - 5 /cu mm    Comment: CRITICAL RESULT CALLED TO, READ BACK BY AND VERIFIED WITH: CALLED TO RN CALLIE STRAUGHN AT 2225 JOHN C    Segmented Neutrophils-CSF 82 (H) 0 - 6 %   Lymphs, CSF 16 (L) 40 - 80 %   Monocyte-Macrophage-Spinal Fluid 2 (L) 15 - 45 %   Eosinophils, CSF 0 0 - 1 %    Comment: Performed at [facility] [address], [place], [place] [address]  CSF cell count with differential collection tube #: 4     Status: Abnormal   Collection Time: 07/19/22  6:59 PM  Result Value Ref Range   Tube # 4    Color, CSF COLORLESS COLORLESS   Appearance, CSF CLEAR (A) CLEAR   Supernatant NOT INDICATED    RBC Count, CSF 24 (H) 0 /cu mm   WBC, CSF 548 (HH) 0 - 5 /cu mm    Comment: CRITICAL RESULT CALLED TO, READ BACK BY AND VERIFIED WITH: CALLRD TO RN CALLIE STRAUGHT AT 2228 JOHN C    Segmented Neutrophils-CSF 88 (H) 0 - 6 %   Lymphs, CSF 10 (L) 40 - 80 %   Monocyte-Macrophage-Spinal Fluid 2 (L) 15 - 45 %   Eosinophils, CSF 0 0 - 1 %    Comment: Performed at [facility] [address] [place], [place] [address]  CSF culture     Status: None (Preliminary result)   Collection Time: 07/19/22  6:59 PM   Specimen: CSF; Cerebrospinal Fluid  Result Value Ref Range   Specimen Description CSF    Special Requests NONE    Gram Stain      WBC PRESENT, PREDOMINANTLY PMN NO ORGANISMS SEEN CYTOSPIN SMEAR     Culture      NO GROWTH < 24 HOURS Performed at [facility] [address], [place], [place] [address]    Report Status PENDING   Glucose, CSF     Status: Abnormal   Collection Time: 07/19/22  6:59 PM  Result Value Ref Range   Glucose, CSF 27 (LL) 40 - 70 mg/dL    Comment: CRITICAL RESULT CALLED TO, READ BACK BY AND VERIFIED WITH Candis Shine, RN AT 2315 on 07/19/22 BY H.HOWARD. Performed at [facility], [place] [address], [place], [place] [address]   Protein, CSF     Status: Abnormal   Collection Time: 07/19/22  6:59 PM  Result Value Ref Range   Total  Protein, CSF 162 (H) 15 - 45 mg/dL    Comment: RESULT CONFIRMED BY MANUAL DILUTION Performed at [facility], [place] [address] [place], [place] [address]   Meningitis/Encephalitis Panel (CSF)     Status: None   Collection Time: 07/19/22  6:59 PM  Result Value Ref Range   Cryptococcus neoformans/gattii (CSF) NOT DETECTED NOT DETECTED    Comment: (NOTE) Patients with a suspicion of cryptococcal meningitis should be tested  for cryptococcal antigen (CrAg).      Cytomegalovirus (CSF) NOT DETECTED NOT DETECTED   Enterovirus (CSF) NOT DETECTED NOT DETECTED   Escherichia coli K1 (CSF) NOT DETECTED NOT DETECTED    Comment: (NOTE) Only E. coli strains possessing the K1 capsular antigen will be detected.      Haemophilus influenzae (CSF)  NOT DETECTED NOT DETECTED   Herpes simplex virus 1 (CSF) NOT DETECTED NOT DETECTED   Herpes simplex virus 2 (CSF) NOT DETECTED NOT DETECTED   Human herpesvirus 6 (CSF) NOT DETECTED NOT DETECTED   Human parechovirus (CSF) NOT DETECTED NOT DETECTED   Listeria monocytogenes (CSF) NOT DETECTED NOT DETECTED   Neisseria meningitis (CSF) NOT DETECTED NOT DETECTED    Comment: (NOTE) Only encapsulated strains of N. meningitidis will be detected.     Streptococcus agalactiae (CSF) NOT DETECTED NOT DETECTED   Streptococcus pneumoniae (CSF) NOT DETECTED NOT DETECTED   Varicella zoster virus  (CSF) NOT DETECTED NOT DETECTED    Comment: Performed at [facility] [address] [place], [place] [address]  Pathologist smear review     Status: None   Collection Time: 07/19/22  6:59 PM  Result Value Ref Range   Path Review      LARGE NUMBER OF NEUTROPHILS ARE PRESENT. CONSISTENT WITH ACUTE INFECTIOUS PROCESS. BACTERIA ARE NOT IDENTIFIED    Comment: Reviewed by Unknown Jim, M.D. 07/20/2022 Performed at [facility], [place] [address], [place], [place] [address]   Pathologist smear review     Status: None   Collection Time: 07/19/22  6:59 PM  Result Value Ref Range   Path Review      LARGE NUMBER OF NEUTROPHILS ARE PRESENT. CONSISTENT WITH ACUTE INFECTIOUS PROCESS. BACTERIA ARE NOT IDENTIFIED.    Comment: Reviewed by Unknown Jim, M.D. 07/20/2022 Performed at [facility], [place] [address], [place], [place] [address]   Lactic acid, plasma     Status: Abnormal   Collection Time: 07/19/22  7:16 PM  Result Value Ref Range   Lactic Acid, Venous 5.0 (HH) 0.5 - 1.9 mmol/L    Comment: CRITICAL RESULT CALLED TO, READ BACK BY AND VERIFIED WITH Geralyn Flash RN 07/19/22 2030 Wiliam Ke Performed at [facility], [address], [place], [place] [address]   Troponin I (High Sensitivity)     Status: None   Collection Time: 07/19/22  7:16 PM  Result Value Ref Range   Troponin I (High Sensitivity) 16 <18 ng/L    Comment: (NOTE) Elevated high sensitivity troponin I (hsTnI) values and significant  changes across serial measurements may suggest ACS but many other  chronic and acute conditions are known to elevate hsTnI results.  Refer to the "Links" section for chest pain algorithms and additional  guidance. Performed at [facility], [place] [address], [place], [place] [address]   Rapid urine drug screen (hospital performed)     Status: None   Collection Time: 07/19/22  7:20 PM  Result Value Ref Range   Opiates NONE DETECTED NONE DETECTED   Cocaine NONE  DETECTED NONE DETECTED   Benzodiazepines NONE DETECTED NONE DETECTED   Amphetamines NONE DETECTED NONE DETECTED   Tetrahydrocannabinol NONE DETECTED NONE DETECTED   Barbiturates NONE DETECTED NONE DETECTED    Comment: (NOTE) DRUG SCREEN FOR MEDICAL PURPOSES ONLY.  IF CONFIRMATION IS NEEDED FOR ANY PURPOSE, NOTIFY LAB WITHIN 5 DAYS.  LOWEST DETECTABLE LIMITS FOR URINE DRUG SCREEN Drug Class                     Cutoff (ng/mL) Amphetamine and metabolites    1000 Barbiturate and metabolites    200 Benzodiazepine                 200 Opiates and metabolites  300 Cocaine and metabolites        300 THC                            50 Performed at [facility], [place] [address], [place], [place] [address]   Lactic acid, plasma     Status: Abnormal   Collection Time: 07/19/22 10:30 PM  Result Value Ref Range   Lactic Acid, Venous 2.0 (HH) 0.5 - 1.9 mmol/L    Comment: CRITICAL VALUE NOTED. VALUE IS CONSISTENT WITH PREVIOUSLY REPORTED/CALLED VALUE Performed at [facility], [place] [address], [place], [place] [address]   Glucose, capillary     Status: Abnormal   Collection Time: 07/19/22 10:59 PM  Result Value Ref Range   Glucose-Capillary 134 (H) 70 - 99 mg/dL    Comment: Glucose reference range applies only to samples taken after fasting for at least 8 hours.  Lactic acid, plasma     Status: None   Collection Time: 07/20/22 12:16 AM  Result Value Ref Range   Lactic Acid, Venous 1.1 0.5 - 1.9 mmol/L    Comment: Performed at [facility] [address] [place], [place] [address]  Hemoglobin A1c     Status: Abnormal   Collection Time: 07/20/22 12:16 AM  Result Value Ref Range   Hgb A1c MFr Bld 4.2 (L) 4.8 - 5.6 %    Comment: (NOTE) Pre diabetes:          5.7%-6.4%  Diabetes:              >6.4%  Glycemic control for   <7.0% adults with diabetes    Mean Plasma Glucose 73.84 mg/dL    Comment: Performed at [facility] [address], [place], [place]  [address]  Glucose, capillary     Status: Abnormal   Collection Time: 07/20/22  3:25 AM  Result Value Ref Range   Glucose-Capillary 208 (H) 70 - 99 mg/dL    Comment: Glucose reference range applies only to samples taken after fasting for at least 8 hours.  CBC     Status: Abnormal   Collection Time: 07/20/22  3:30 AM  Result Value Ref Range   WBC 3.8 (L) 4.0 - 10.5 K/uL   RBC 3.84 (L) 3.87 - 5.11 MIL/uL   Hemoglobin 11.7 (L) 12.0 - 15.0 g/dL   HCT 34.5 (L) 36.0 - 46.0 %   MCV 89.8 80.0 - 100.0 fL   MCH 30.5 26.0 - 34.0 pg   MCHC 33.9 30.0 - 36.0 g/dL   RDW 11.6 11.5 - 15.5 %   Platelets 134 (L) 150 - 400 K/uL   nRBC 0.0 0.0 - 0.2 %    Comment: Performed at [facility], [place] [address] [place],  [address]  Basic metabolic panel     Status: Abnormal   Collection Time: 07/20/22  3:30 AM  Result Value Ref Range   Sodium 149 (H) 135 - 145 mmol/L    Comment: DELTA CHECK NOTED   Potassium 4.3 3.5 - 5.1 mmol/L   Chloride 122 (H) 98 - 111 mmol/L   CO2 24 22 - 32 mmol/L   Glucose, Bld 195 (H) 70 - 99 mg/dL    Comment: Glucose reference range applies only to samples taken after fasting for at least 8 hours.   BUN 11 6 - 20 mg/dL   Creatinine, Ser 0.73 0.44 - 1.00 mg/dL  Calcium 8.9 8.9 - 10.3 mg/dL   GFR, Estimated >60 >60 mL/min    Comment: (NOTE) Calculated using the CKD-EPI Creatinine Equation (2021)    Anion gap 3 (L) 5 - 15    Comment: Performed at [facility] [address] [place], [place] [address]  Magnesium     Status: None   Collection Time: 07/20/22  3:30 AM  Result Value Ref Range   Magnesium 1.9 1.7 - 2.4 mg/dL    Comment: Performed at [facility] [address] [place], [place] [address]  Phosphorus     Status: Abnormal   Collection Time: 07/20/22  3:30 AM  Result Value Ref Range   Phosphorus 1.6 (L) 2.5 - 4.6 mg/dL    Comment: Performed at [facility] [address], [place], [place] [address]  Glucose, capillary     Status:  Abnormal   Collection Time: 07/20/22  7:57 AM  Result Value Ref Range   Glucose-Capillary 162 (H) 70 - 99 mg/dL    Comment: Glucose reference range applies only to samples taken after fasting for at least 8 hours.  Glucose, capillary     Status: Abnormal   Collection Time: 07/20/22 11:43 AM  Result Value Ref Range   Glucose-Capillary 111 (H) 70 - 99 mg/dL    Comment: Glucose reference range applies only to samples taken after fasting for at least 8 hours.   Comment 1 U   Glucose, capillary     Status: Abnormal   Collection Time: 07/20/22  3:45 PM  Result Value Ref Range   Glucose-Capillary 120 (H) 70 - 99 mg/dL    Comment: Glucose reference range applies only to samples taken after fasting for at least 8 hours.    MR BRAIN W WO CONTRAST  Result Date: 07/20/2022 CLINICAL DATA:  Provided history: Mental status change unknown cause. EXAM: MRI HEAD WITHOUT AND WITH CONTRAST TECHNIQUE: Multiplanar, multiecho pulse sequences of the brain and surrounding structures were obtained without and with intravenous contrast. CONTRAST:  7.39m GADAVIST GADOBUTROL 1 MMOL/ML IV SOLN COMPARISON:  Prior head CT examinations 07/19/2022 and earlier. FINDINGS: Brain: Cerebral volume is normal. There is a centrally cystic, peripherally enhancing lesion occupying the sella and extending into the suprasellar cistern, measuring 1.1 x 1.8 x 1.9 cm. There are apparent chronic blood products along the periphery of the lesion. The pituitary gland is poorly delineated separately from the lesion. The pituitary stalk is displaced superiorly and posteriorly (for instance as seen on series 15, image 8). The lesion encroaches upon the optic apparatus (for instance as seen on series 13, image 9). No evidence of cavernous sinus extension. No cortical encephalomalacia is identified. No significant cerebral white matter disease. There is no acute infarct. No extra-axial fluid collection. No midline shift. Vascular: Maintained flow voids  within the proximal large arterial vessels. Skull and upper cervical spine: No focal suspicious marrow lesion. Sinuses/Orbits: Mild mucosal thickening within the bilateral ethmoid, sphenoid and maxillary sinuses. 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 There are apparent chronic blood products along the periphery of this lesion. Favored differential considerations include a cystic/necrotic pituitary macroadenoma, sequela of pituitary infarct/hemorrhage (as can be seen in the setting of pituitary apoplexy) or large Rathke's cyst (with prior hemorrhage). Craniopharyngioma is considered less likely as these lesions are not typically cystic in adult patients. The lesion encroaches upon the optic  apparatus. No evidence of cavernous sinus extension. Otherwise unremarkable MRI appearance of the brain. Mild mucosal thickening within the paranasal sinuses. Electronically Signed   By: Kellie Simmering D.O.   On: 07/20/2022 16:21   CT Head Wo Contrast  Result Date: 07/19/2022 CLINICAL DATA:  Altered mental status. EXAM: CT HEAD WITHOUT CONTRAST TECHNIQUE: Contiguous axial images were obtained from the base of the skull through the vertex without intravenous contrast. RADIATION DOSE REDUCTION: This exam was performed according to the departmental dose-optimization program which includes automated exposure control, adjustment of the mA and/or kV according to patient size and/or use of iterative reconstruction technique. COMPARISON:  Head CT dated 05/02/2022. FINDINGS: Brain: The ventricles and sulci are appropriate size for the patient's age. The gray-white matter discrimination is preserved. There is no acute intracranial hemorrhage. There is an enlarging sellar/suprasellar mass measuring 1.9 x 1.4 cm in greatest axial dimensions and approximately 2 cm in craniocaudal length. Further evaluation with  pituitary mass protocol MRI recommended. No mass effect or midline shift. No extra-axial fluid collection. Vascular: No hyperdense vessel or unexpected calcification. Skull: Normal. Negative for fracture or focal lesion. Sinuses/Orbits: Mild mucoperiosteal thickening of paranasal sinuses. No air-fluid level. The mastoid air cells are clear. Other: None IMPRESSION: 1. No acute intracranial hemorrhage. 2. Enlarging sellar/suprasellar mass. Further evaluation with pituitary mass protocol MRI recommended. Electronically Signed   By: Anner Crete M.D.   On: 07/19/2022 19:07   DG Chest Port 1 View  Result Date: 07/19/2022 CLINICAL DATA:  Altered mental status possible sepsis EXAM: PORTABLE CHEST 1 VIEW COMPARISON:  None Available. FINDINGS: The heart size and mediastinal contours are within normal limits. Both lungs are clear. The visualized skeletal structures are unremarkable. Clips at the right thoracic inlet. IMPRESSION: No active disease. Electronically Signed   By: Donavan Foil M.D.   On: 07/19/2022 17:33    Review of Systems  Constitutional:  Positive for activity change and fever.  Neurological:  Positive for weakness and headaches.   Blood pressure (!) 129/99, pulse 98, temperature 98.1 F (36.7 C), temperature source Axillary, resp. rate 16, height '5\' 6"'$  (1.676 m), weight 78 kg, last menstrual period 04/05/2021, SpO2 95 %, unknown if currently breastfeeding. Physical Exam Constitutional:      Appearance: Normal appearance. She is obese.  HENT:     Head: Normocephalic and atraumatic.     Right Ear: Tympanic membrane normal.     Left Ear: Tympanic membrane normal.     Nose: Nose normal.     Mouth/Throat:     Mouth: Mucous membranes are moist.     Pharynx: Oropharynx is clear.  Eyes:     Extraocular Movements: Extraocular movements intact.     Conjunctiva/sclera: Conjunctivae normal.     Pupils: Pupils are equal, round, and reactive to light.  Cardiovascular:     Rate and Rhythm:  Normal rate and regular rhythm.     Pulses: Normal pulses.     Heart sounds: Normal heart sounds.  Pulmonary:     Effort: Pulmonary effort is normal.     Breath sounds: Normal breath sounds.  Abdominal:     General: Abdomen is flat.     Palpations: Abdomen is soft.  Neurological:     Mental Status: She is alert.     Comments: Patient is alert and conversant.  Complains of headaches notes that she is photophobic.  Her pupils are 3 mm and briskly reactive to light accommodation is intact also the extraocular movements are full.  Face is symmetric.  Tongue and uvula are in the midline sclera and conjunctiva are clear upper extremity and lower extremity strength appears intact with normal reflexes in the biceps triceps patella and the Achilles Babinski's are downgoing.  Psychiatric:        Mood and Affect: Mood normal.        Behavior: Behavior normal.        Thought Content: Thought content normal.        Judgment: Judgment normal.     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  Currently the patient's vision is stable and I believe work-up of her pituitary axes with serum cortisol levels growth hormone TSH and prolactin level would be appropriate.  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  As far as acute surgical intervention at this time I do not believe it will be required however squint follow-up of this lesion needs to be undertaken and ultimately a formal ophthalmologic examination with visual field exam can be performed on an outpatient basis.  I can then follow this patient on an outpatient basis and discuss whether surgical intervention for residual pituitary adenoma needs to be considered.  Should there be any acute visual changes or loss of vision then certainly emergent surgery would be  undertaken.  Sara Mathis 07/20/2022, 6:41 PM

## 2022-07-21 LAB — GLUCOSE, CAPILLARY
Glucose-Capillary: 120 mg/dL — ABNORMAL HIGH (ref 70–99)
Glucose-Capillary: 127 mg/dL — ABNORMAL HIGH (ref 70–99)
Glucose-Capillary: 128 mg/dL — ABNORMAL HIGH (ref 70–99)
Glucose-Capillary: 130 mg/dL — ABNORMAL HIGH (ref 70–99)
Glucose-Capillary: 153 mg/dL — ABNORMAL HIGH (ref 70–99)
Glucose-Capillary: 169 mg/dL — ABNORMAL HIGH (ref 70–99)

## 2022-07-21 LAB — URINALYSIS, ROUTINE W REFLEX MICROSCOPIC
Bilirubin Urine: NEGATIVE
Glucose, UA: NEGATIVE mg/dL
Hgb urine dipstick: NEGATIVE
Ketones, ur: NEGATIVE mg/dL
Leukocytes,Ua: NEGATIVE
Nitrite: NEGATIVE
Protein, ur: NEGATIVE mg/dL
Specific Gravity, Urine: 1.004 — ABNORMAL LOW (ref 1.005–1.030)
pH: 7 (ref 5.0–8.0)

## 2022-07-21 LAB — BASIC METABOLIC PANEL
Anion gap: 5 (ref 5–15)
Anion gap: 10 (ref 5–15)
BUN: 7 mg/dL (ref 6–20)
BUN: 7 mg/dL (ref 6–20)
CO2: 23 mmol/L (ref 22–32)
CO2: 23 mmol/L (ref 22–32)
Calcium: 8.6 mg/dL — ABNORMAL LOW (ref 8.9–10.3)
Calcium: 9 mg/dL (ref 8.9–10.3)
Chloride: 119 mmol/L — ABNORMAL HIGH (ref 98–111)
Chloride: 118 mmol/L — ABNORMAL HIGH (ref 98–111)
Creatinine, Ser: 0.72 mg/dL (ref 0.44–1.00)
Creatinine, Ser: 0.66 mg/dL (ref 0.44–1.00)
GFR, Estimated: >60 mL/min (ref >60)
GFR, Estimated: >60 mL/min (ref >60)
Glucose, Bld: 200 mg/dL — ABNORMAL HIGH (ref 70–99)
Glucose, Bld: 118 mg/dL — ABNORMAL HIGH (ref 70–99)
Potassium: 2.9 mmol/L — ABNORMAL LOW (ref 3.5–5.1)
Potassium: 3.4 mmol/L — ABNORMAL LOW (ref 3.5–5.1)
Sodium: 147 mmol/L — ABNORMAL HIGH (ref 135–145)
Sodium: 151 mmol/L — ABNORMAL HIGH (ref 135–145)

## 2022-07-21 LAB — CBC
HCT: 30.8 % — ABNORMAL LOW (ref 36.0–46.0)
Hemoglobin: 10.6 g/dL — ABNORMAL LOW (ref 12.0–15.0)
MCH: 31 pg (ref 26.0–34.0)
MCHC: 34.4 g/dL (ref 30.0–36.0)
MCV: 90.1 fL (ref 80.0–100.0)
Platelets: 141 10*3/uL — ABNORMAL LOW (ref 150–400)
RBC: 3.42 MIL/uL — ABNORMAL LOW (ref 3.87–5.11)
RDW: 11.9 % (ref 11.5–15.5)
WBC: 14.5 10*3/uL — ABNORMAL HIGH (ref 4.0–10.5)
nRBC: 0 % (ref 0.0–0.2)

## 2022-07-21 LAB — SODIUM
Sodium: 153 mmol/L — ABNORMAL HIGH (ref 135–145)
Sodium: 149 mmol/L — ABNORMAL HIGH (ref 135–145)
Sodium: 145 mmol/L (ref 135–145)

## 2022-07-21 LAB — OSMOLALITY, URINE
Osmolality, Ur: 143 mOsm/kg — ABNORMAL LOW (ref 300–900)
Osmolality, Ur: 144 mOsm/kg — ABNORMAL LOW (ref 300–900)
Osmolality, Ur: 175 mOsm/kg — ABNORMAL LOW (ref 300–900)
Osmolality, Ur: 199 mOsm/kg — ABNORMAL LOW (ref 300–900)
Osmolality, Ur: 257 mOsm/kg — ABNORMAL LOW (ref 300–900)

## 2022-07-21 LAB — MAGNESIUM: Magnesium: 1.9 mg/dL (ref 1.7–2.4)

## 2022-07-21 LAB — CORTISOL: Cortisol, Plasma: 6 ug/dL

## 2022-07-21 LAB — MRSA NEXT GEN BY PCR, NASAL: MRSA by PCR Next Gen: NOT DETECTED

## 2022-07-21 LAB — PHOSPHORUS
Phosphorus: 1.4 mg/dL — ABNORMAL LOW (ref 2.5–4.6)
Phosphorus: 4.1 mg/dL (ref 2.5–4.6)

## 2022-07-21 LAB — T4, FREE: Free T4: 1.86 ng/dL — ABNORMAL HIGH (ref 0.61–1.12)

## 2022-07-21 MED ORDER — SODIUM PHOSPHATES 45 MMOLE/15ML IV SOLN
45.0000 mmol | Freq: Once | INTRAVENOUS | Status: AC
  Administered 2022-07-21: 45 mmol via INTRAVENOUS
  Filled 2022-07-21: qty 15

## 2022-07-21 MED ORDER — DEXTROSE 5 % IV SOLN: INTRAVENOUS | Status: DC

## 2022-07-21 MED ORDER — DESMOPRESSIN ACETATE 4 MCG/ML IJ SOLN
4.0000 ug | Freq: Once | INTRAMUSCULAR | Status: AC
  Administered 2022-07-21: 4 ug via INTRAVENOUS
  Filled 2022-07-21: qty 1

## 2022-07-21 MED ORDER — ONDANSETRON HCL 4 MG/2ML IJ SOLN
4.0000 mg | Freq: Four times a day (QID) | INTRAMUSCULAR | Status: DC | PRN
  Administered 2022-07-22: 4 mg via INTRAVENOUS
  Filled 2022-07-21: qty 2

## 2022-07-21 MED ORDER — POTASSIUM CHLORIDE 20 MEQ PO PACK
40.0000 meq | PACK | Freq: Two times a day (BID) | ORAL | Status: AC
  Administered 2022-07-21 (×2): 40 meq via ORAL
  Filled 2022-07-21 (×2): qty 2

## 2022-07-21 MED ORDER — DESMOPRESSIN ACETATE SPRAY 0.01 % NA SOLN
10.0000 ug | Freq: Two times a day (BID) | NASAL | Status: DC
  Administered 2022-07-21 – 2022-07-22 (×2): 10 ug via NASAL
  Filled 2022-07-21: qty 5

## 2022-07-21 NOTE — Progress Notes (Signed)
ID follow up note:  Chart and consultant notes reviewed.  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 Infectious meningitis seems less likely at this time.  The CSF cultures are negative and ME panel negative as well.  Will stop vancomycin and MTZ.  If CSF cx continue to be negative will plan to stop ceftriaxone, too.  Dr Mauricio Po note from today also in agreement that infectious meningitis unlikely.   Raynelle Highland for Infectious [facility] 07/21/2022, 8:11 PM

## 2022-07-21 NOTE — Progress Notes (Addendum)
NAME:  Sara Mathis, MRN:  [id number], DOB:  [birth year], LOS: 2 ADMISSION DATE:  07/19/2022, CONSULTATION DATE:  07/21/22 REFERRING MD:  EDP, CHIEF COMPLAINT:  AMS   History of Present Illness:  Sara Mathis is a 36 y.o. F with PMH significant for migraines and hypothyroidism who presented to the ED with acute altered mental status.  Per her roommate, she was in her normal state of health 11/1, then stopped talking and following commands the next day.  She has had increased migraine headaches for the last month.  She was initially febrile to 101F, tachycardic and agitated.  Initial labs showed lactic acid of 3.1, no leukocytosis, TSH 0.065, normal ethanol, UDS pending.  UA and CXR without clear source of infection.  She was given 3L IVF, broad spectrum abx and head CT/LP pending.  Her agitation required precedex infusion, so PCCM consulted for admission  Upon evaluation pt is arousable to painful stim, no sonorous respirations and appears to be protecting airway. Laying completely flat supine on RA with adequate sats. She provides no history.  Pertinent  Medical History   has a past medical history of Overweight (BMI 25.0-29.9) (06/10/2017) and Thyroid disease (02/21/2015).   Significant Hospital Events: Including procedures, antibiotic start and stop dates in addition to other pertinent events   11/2 presented to ED altered, tachycardic and febrile, concern for sepsis and meningitis, on precedex, got Cefepime, Flagyl, Vanc added acyclovir empirically until LP done and resulted  Interim History / Subjective:  No HA no distress  Objective   Blood pressure 116/74, pulse (Abnormal) 123, temperature 98.3 F (36.8 C), temperature source Oral, resp. rate (Abnormal) 0, height '5\' 6"'$  (1.676 m), weight 78 kg, last menstrual period 04/05/2021, SpO2 96 %, unknown if currently breastfeeding.        Intake/Output Summary (Last 24 hours) at 07/21/2022 1058 Last data filed at 07/21/2022  1000 Gross per 24 hour  Intake 6719.03 ml  Output 5675 ml  Net 1044.03 ml   Filed Weights   07/19/22 1616 07/20/22 0200  Weight: 77 kg 78 kg    Examination: General resting in bed no distress HENT NCAT no JVD  Pulm clear Card rrr Abd soft Ext warm and dry  Neuro intact  Resolved Hospital Problem list   Severe Sepsis, lactic acidosis > Resolved   Assessment & Plan:   Acute Encephalopathy ? 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  Hypernatremia, hypokalemia. Has fairly dilute urine.  Plan Free water replacement F/u urine osmo and spec grav Give ddavp Monitor serum sodium closely  no more than 10-12 mEq/L over 24 hours  Replace K Serial chems  s/p Total Thyroidectomy for Graves disease  Plan Cont home levothyroxine   Best Practice (right click and "Reselect all SmartList Selections" daily)   Diet/type: Regular consistency (see orders) DVT prophylaxis: SCD GI prophylaxis: N/A Lines: N/A Foley:  Yes, and  it is still needed Code Status:  full code Last date of multidisciplinary goals of care discussion family updated at bedside.    Erick Colace ACNP-BC [place] Pager # [phone] OR # [phone] if no answer

## 2022-07-22 DIAGNOSIS — E236 Other disorders of pituitary gland: Secondary | ICD-10-CM

## 2022-07-22 DIAGNOSIS — E230 Hypopituitarism: Secondary | ICD-10-CM

## 2022-07-22 LAB — OSMOLALITY, URINE
Osmolality, Ur: 483 mOsm/kg (ref 300–900)
Osmolality, Ur: 514 mOsm/kg (ref 300–900)

## 2022-07-22 LAB — URINALYSIS, COMPLETE (UACMP) WITH MICROSCOPIC
Bacteria, UA: NONE SEEN
Bacteria, UA: NONE SEEN
Bacteria, UA: NONE SEEN
Bilirubin Urine: NEGATIVE
Bilirubin Urine: NEGATIVE
Bilirubin Urine: NEGATIVE
Glucose, UA: NEGATIVE mg/dL
Glucose, UA: NEGATIVE mg/dL
Glucose, UA: NEGATIVE mg/dL
Hgb urine dipstick: NEGATIVE
Hgb urine dipstick: NEGATIVE
Hgb urine dipstick: NEGATIVE
Ketones, ur: NEGATIVE mg/dL
Ketones, ur: NEGATIVE mg/dL
Ketones, ur: NEGATIVE mg/dL
Leukocytes,Ua: NEGATIVE
Leukocytes,Ua: NEGATIVE
Leukocytes,Ua: NEGATIVE
Nitrite: NEGATIVE
Nitrite: NEGATIVE
Nitrite: NEGATIVE
Protein, ur: NEGATIVE mg/dL
Protein, ur: NEGATIVE mg/dL
Protein, ur: NEGATIVE mg/dL
Specific Gravity, Urine: 1.01 (ref 1.005–1.030)
Specific Gravity, Urine: 1.011 (ref 1.005–1.030)
Specific Gravity, Urine: 1.012 (ref 1.005–1.030)
pH: 7 (ref 5.0–8.0)
pH: 8 (ref 5.0–8.0)
pH: 8 (ref 5.0–8.0)

## 2022-07-22 LAB — CBC
HCT: 28.4 % — ABNORMAL LOW (ref 36.0–46.0)
Hemoglobin: 9.2 g/dL — ABNORMAL LOW (ref 12.0–15.0)
MCH: 30.1 pg (ref 26.0–34.0)
MCHC: 32.4 g/dL (ref 30.0–36.0)
MCV: 92.8 fL (ref 80.0–100.0)
Platelets: 124 10*3/uL — ABNORMAL LOW (ref 150–400)
RBC: 3.06 MIL/uL — ABNORMAL LOW (ref 3.87–5.11)
RDW: 11.9 % (ref 11.5–15.5)
WBC: 11.3 10*3/uL — ABNORMAL HIGH (ref 4.0–10.5)
nRBC: 0 % (ref 0.0–0.2)

## 2022-07-22 LAB — CORTISOL-AM, BLOOD: Cortisol - AM: 1.5 ug/dL — ABNORMAL LOW (ref 6.7–22.6)

## 2022-07-22 LAB — ACTH STIMULATION, 3 TIME POINTS
Cortisol, 30 Min: 16.1 ug/dL
Cortisol, 60 Min: 19.1 ug/dL
Cortisol, Base: 1 ug/dL

## 2022-07-22 LAB — SODIUM
Sodium: 140 mmol/L (ref 135–145)
Sodium: 132 mmol/L — ABNORMAL LOW (ref 135–145)
Sodium: 131 mmol/L — ABNORMAL LOW (ref 135–145)

## 2022-07-22 LAB — BASIC METABOLIC PANEL
Anion gap: 9 (ref 5–15)
BUN: 7 mg/dL (ref 6–20)
CO2: 22 mmol/L (ref 22–32)
Calcium: 8.2 mg/dL — ABNORMAL LOW (ref 8.9–10.3)
Chloride: 103 mmol/L (ref 98–111)
Creatinine, Ser: 0.48 mg/dL (ref 0.44–1.00)
GFR, Estimated: >60 mL/min (ref >60)
Glucose, Bld: 95 mg/dL (ref 70–99)
Potassium: 2.9 mmol/L — ABNORMAL LOW (ref 3.5–5.1)
Sodium: 134 mmol/L — ABNORMAL LOW (ref 135–145)

## 2022-07-22 LAB — PHOSPHORUS: Phosphorus: 3.4 mg/dL (ref 2.5–4.6)

## 2022-07-22 LAB — MAGNESIUM: Magnesium: 1.4 mg/dL — ABNORMAL LOW (ref 1.7–2.4)

## 2022-07-22 LAB — GLUCOSE, CAPILLARY: Glucose-Capillary: 110 mg/dL — ABNORMAL HIGH (ref 70–99)

## 2022-07-22 MED ORDER — DESMOPRESSIN ACETATE SPRAY 0.01 % NA SOLN
10.0000 ug | Freq: Every day | NASAL | Status: DC
  Filled 2022-07-22: qty 5

## 2022-07-22 MED ORDER — HYDROCORTISONE 5 MG PO TABS
15.0000 mg | ORAL_TABLET | Freq: Every day | ORAL | Status: DC
  Administered 2022-07-22 – 2022-07-24 (×3): 15 mg via ORAL
  Filled 2022-07-22 (×3): qty 1

## 2022-07-22 MED ORDER — COSYNTROPIN 0.25 MG IJ SOLR
0.2500 mg | Freq: Once | INTRAMUSCULAR | Status: AC
  Administered 2022-07-22: 0.25 mg via INTRAVENOUS
  Filled 2022-07-22: qty 0.25

## 2022-07-22 MED ORDER — MAGNESIUM SULFATE 4 GM/100ML IV SOLN
4.0000 g | Freq: Once | INTRAVENOUS | Status: AC
  Administered 2022-07-22: 4 g via INTRAVENOUS
  Filled 2022-07-22: qty 100

## 2022-07-22 MED ORDER — HYDROCORTISONE 5 MG PO TABS
5.0000 mg | ORAL_TABLET | ORAL | Status: DC
  Administered 2022-07-22 – 2022-07-23 (×2): 5 mg via ORAL
  Filled 2022-07-22 (×3): qty 1

## 2022-07-22 MED ORDER — POTASSIUM CHLORIDE CRYS ER 20 MEQ PO TBCR
40.0000 meq | EXTENDED_RELEASE_TABLET | ORAL | Status: AC
  Administered 2022-07-22 (×2): 40 meq via ORAL
  Filled 2022-07-22 (×2): qty 2

## 2022-07-22 NOTE — Progress Notes (Addendum)
NAME:  Sara Mathis, MRN:  [id number], DOB:  30-Sep-1985, LOS: 3 ADMISSION DATE:  07/19/2022, CONSULTATION DATE:  07/22/22 REFERRING MD:  EDP, CHIEF COMPLAINT:  AMS   History of Present Illness:  Sara Mathis is a 36 y.o. F with PMH significant for migraines and hypothyroidism who presented to the ED with acute altered mental status.  Per her roommate, she was in her normal state of health 11/1, then stopped talking and following commands the next day.  She has had increased migraine headaches for the last month.  She was initially febrile to 101F, tachycardic and agitated.  Initial labs showed lactic acid of 3.1, no leukocytosis, TSH 0.065, normal ethanol, UDS pending.  UA and CXR without clear source of infection.  She was given 3L IVF, broad spectrum abx and head CT/LP pending.  Her agitation required precedex infusion, so PCCM consulted for admission Was admitted to ICU  Pertinent  Medical History   has a past medical history of Overweight (BMI 25.0-29.9) (06/10/2017) and Thyroid disease (02/21/2015).   Significant Hospital Events: Including procedures, antibiotic start and stop dates in addition to other pertinent events   11/2 presented to ED altered, tachycardic and febrile, concern for sepsis and meningitis, on precedex, got Cefepime, Flagyl, Vanc added acyclovir empirically until LP done and resulted 11/4 vanc and MTZ stopped. Na 151 got DDAVP and free water. Nasal DDAVP started. Spoke to Sara Mathis who referrer to endocrine guidelines  for management of panhypopit. 11/5: Cosyntropin stim test completed.  Stop D5 water.  Moved to medical ward  Interim History / Subjective:   No distress Objective   Blood pressure 101/77, pulse 78, temperature 98.5 F (36.9 C), temperature source Oral, resp. rate 19, height '5\' 6"'$  (1.676 m), weight 78 kg, last menstrual period 04/05/2021, SpO2 96 %, unknown if currently breastfeeding.        Intake/Output Summary (Last 24 hours) at  07/22/2022 0705 Last data filed at 07/22/2022 6962 Gross per 24 hour  Intake 4387.89 ml  Output 3300 ml  Net 1087.89 ml   Filed Weights   07/19/22 1616 07/20/22 0200  Weight: 77 kg 78 kg    Examination: General pleasant 36 year old female resting in bed no acute distress HEENT normocephalic atraumatic no regalado venous distention Pulmonary: Clear to auscultation Cardiac: Regular rate and rhythm Abdomen: Soft nontender Neuro: Awake oriented no focal deficits. 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 Will likely need steroid replacement  Spoke w/ endocrine at [facility] referred to following guideline: https://www.endocrine.org/clinical-practice-guidelines/hormone-replacement-in-hypopituitarism#2 She will need endocrine f/u at out pt  Awaiting final culture then dc CTX  Fluid and electrolyte imbalance 2/2 Central DI Na has normalized. spec grav nml.  Plan Dc free water. Allow her to drink Cont Nasal DDAVP-->prob needs a couple of days w/ this dosing to ensure water balance Will cont to follow spec grav and Na today F/u chems and spec grav this afternoon and again in am    s/p Total Thyroidectomy for Graves disease  Plan Cont synthroid   Best Practice (right click and "Reselect all SmartList Selections" daily)   Diet/type: Regular consistency (see orders) DVT prophylaxis: SCD GI prophylaxis: N/A Lines: N/A  Foley:  Yes, and it is still needed Code Status:  full code Last date of multidisciplinary goals of care discussion family updated at bedside.    Erick Colace ACNP-BC [place] Pager  # [phone] OR # [phone] if no answer

## 2022-07-22 NOTE — Progress Notes (Signed)
Pharmacy Electrolyte Replacement  Recent Labs:  Recent Labs    07/22/22 0713  K 2.9*  MG 1.4*  PHOS 3.4  CREATININE 0.48    Low Critical Values (K </= 2.5, Phos </= 1, Mg </= 1) Present: None  MD Contacted: Shearon Stalls, N  Plan:  Mag sulfate 4g IV x 1 Give KCl 37mq PO q4h x 2 doses per discussion with Dr. DCydney Oklabs in AM per protocol   HArturo Morton PharmD, BCPS Please check AMION for all MCampticontact numbers Clinical Pharmacist 07/22/2022 10:50 AM

## 2022-07-22 NOTE — Progress Notes (Signed)
Na a little lower than goal.  Plan Hold DDAVP tonight.  Change to q HS starting am   Erick Colace ACNP-BC [place] Pager # [phone] OR # [phone] if no answer

## 2022-07-22 NOTE — Plan of Care (Signed)
  Problem: Safety: Goal: Non-violent Restraint(s) Outcome: Progressing   Problem: Education: Goal: Knowledge of General Education information will improve Description: Including pain rating scale, medication(s)/side effects and non-pharmacologic comfort measures Outcome: Progressing   Problem: Health Behavior/Discharge Planning: Goal: Ability to manage health-related needs will improve Outcome: Progressing   Problem: Clinical Measurements: Goal: Ability to maintain clinical measurements within normal limits will improve Outcome: Progressing Goal: Will remain free from infection Outcome: Progressing Goal: Diagnostic test results will improve Outcome: Progressing Goal: Respiratory complications will improve Outcome: Progressing Goal: Cardiovascular complication will be avoided Outcome: Progressing   Problem: Activity: Goal: Risk for activity intolerance will decrease Outcome: Progressing   Problem: Nutrition: Goal: Adequate nutrition will be maintained Outcome: Progressing   Problem: Coping: Goal: Level of anxiety will decrease Outcome: Progressing   Problem: Elimination: Goal: Will not experience complications related to bowel motility Outcome: Progressing Goal: Will not experience complications related to urinary retention Outcome: Progressing   Problem: Pain Managment: Goal: General experience of comfort will improve Outcome: Progressing   Problem: Safety: Goal: Ability to remain free from injury will improve Outcome: Progressing   Problem: Skin Integrity: Goal: Risk for impaired skin integrity will decrease Outcome: Progressing   Problem: Education: Goal: Ability to describe self-care measures that may prevent or decrease complications (Diabetes Survival Skills Education) will improve Outcome: Progressing Goal: Individualized Educational Video(s) Outcome: Progressing   Problem: Coping: Goal: Ability to adjust to condition or change in health will  improve Outcome: Progressing   Problem: Fluid Volume: Goal: Ability to maintain a balanced intake and output will improve Outcome: Progressing   Problem: Health Behavior/Discharge Planning: Goal: Ability to identify and utilize available resources and services will improve Outcome: Progressing Goal: Ability to manage health-related needs will improve Outcome: Progressing   Problem: Metabolic: Goal: Ability to maintain appropriate glucose levels will improve Outcome: Progressing   Problem: Nutritional: Goal: Maintenance of adequate nutrition will improve Outcome: Progressing Goal: Progress toward achieving an optimal weight will improve Outcome: Progressing   Problem: Skin Integrity: Goal: Risk for impaired skin integrity will decrease Outcome: Progressing   Problem: Tissue Perfusion: Goal: Adequacy of tissue perfusion will improve Outcome: Progressing

## 2022-07-23 LAB — CBC
HCT: 30.7 % — ABNORMAL LOW (ref 36.0–46.0)
Hemoglobin: 10.4 g/dL — ABNORMAL LOW (ref 12.0–15.0)
MCH: 30.5 pg (ref 26.0–34.0)
MCHC: 33.9 g/dL (ref 30.0–36.0)
MCV: 90 fL (ref 80.0–100.0)
Platelets: 144 10*3/uL — ABNORMAL LOW (ref 150–400)
RBC: 3.41 MIL/uL — ABNORMAL LOW (ref 3.87–5.11)
RDW: 11.4 % — ABNORMAL LOW (ref 11.5–15.5)
WBC: 6.2 10*3/uL (ref 4.0–10.5)
nRBC: 0 % (ref 0.0–0.2)

## 2022-07-23 LAB — URINALYSIS, ROUTINE W REFLEX MICROSCOPIC
Bilirubin Urine: NEGATIVE
Glucose, UA: NEGATIVE mg/dL
Hgb urine dipstick: NEGATIVE
Ketones, ur: NEGATIVE mg/dL
Leukocytes,Ua: NEGATIVE
Nitrite: NEGATIVE
Protein, ur: NEGATIVE mg/dL
Specific Gravity, Urine: 1.005 (ref 1.005–1.030)
pH: 7 (ref 5.0–8.0)

## 2022-07-23 LAB — BASIC METABOLIC PANEL
Anion gap: 11 (ref 5–15)
BUN: 10 mg/dL (ref 6–20)
CO2: 19 mmol/L — ABNORMAL LOW (ref 22–32)
Calcium: 8.6 mg/dL — ABNORMAL LOW (ref 8.9–10.3)
Chloride: 101 mmol/L (ref 98–111)
Creatinine, Ser: 0.48 mg/dL (ref 0.44–1.00)
GFR, Estimated: >60 mL/min (ref >60)
Glucose, Bld: 93 mg/dL (ref 70–99)
Potassium: 3.8 mmol/L (ref 3.5–5.1)
Sodium: 131 mmol/L — ABNORMAL LOW (ref 135–145)

## 2022-07-23 LAB — MAGNESIUM: Magnesium: 2.1 mg/dL (ref 1.7–2.4)

## 2022-07-23 LAB — PHOSPHORUS: Phosphorus: 3.6 mg/dL (ref 2.5–4.6)

## 2022-07-23 LAB — OSMOLALITY, URINE: Osmolality, Ur: 189 mOsm/kg — ABNORMAL LOW (ref 300–900)

## 2022-07-23 LAB — URIC ACID: Uric Acid, Serum: 1.9 mg/dL — ABNORMAL LOW (ref 2.5–7.1)

## 2022-07-23 LAB — CSF CULTURE W GRAM STAIN: Culture: NO GROWTH

## 2022-07-23 LAB — PROLACTIN: Prolactin: 28.8 ng/mL — ABNORMAL HIGH (ref 4.8–23.3)

## 2022-07-23 LAB — SODIUM, URINE, RANDOM: Sodium, Ur: 46 mmol/L

## 2022-07-23 LAB — OSMOLALITY: Osmolality: 279 mOsm/kg (ref 275–295)

## 2022-07-23 LAB — CREATININE, URINE, RANDOM: Creatinine, Urine: 21 mg/dL

## 2022-07-23 LAB — GROWTH HORMONE: Growth Hormone: 0.4 ng/mL (ref 0.0–10.0)

## 2022-07-23 LAB — CORTISOL: Cortisol, Plasma: 6.6 ug/dL

## 2022-07-23 LAB — LUTEINIZING HORMONE: LH: <0.3 m[IU]/mL

## 2022-07-23 LAB — DHEA-SULFATE: DHEA-SO4: 261 ug/dL (ref 57.3–279.2)

## 2022-07-23 MED ORDER — DESMOPRESSIN ACETATE SPRAY 0.01 % NA SOLN
10.0000 ug | Freq: Every day | NASAL | Status: DC
  Filled 2022-07-23: qty 5

## 2022-07-23 NOTE — Evaluation (Signed)
Physical Therapy Evaluation Patient Details Name: Sara Mathis MRN: [id number] DOB: [birth year] Today's Date: 07/23/2022  History of Present Illness  36 y.o. F admitted 11/2 who presented to the ED with acute altered mental status.  Per her roommate, she was in her normal state of health 11/1, then stopped talking and following commands the next day.  She has had increased migraine headaches for the last month.  She was initially febrile to 101F, tachycardic and agitated.  Initial labs showed lactic acid of 3.1, no leukocytosis, TSH 0.065, normal ethanol, UDS pending.  UA and CXR without clear source of infection.  She was given 3L IVF, broad spectrum abx.  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  PMH: migraines and hypothyroidism, thyroidectomy  Clinical Impression  Pt admitted with above diagnosis. Pt was able to ambulate on unit withut device but did have occasional LOB of which pt able to self correct today unless challenged moderately. Pt is not at her baseline due to balance issues. Also incidental finding of right vestibular hypofunction and intiiated x 1 exercises to address.  Will follow acutely.  Pt currently with functional limitations due to the deficits listed below (see PT Problem List). Pt will benefit from skilled PT to increase their independence and safety with mobility to allow discharge to the venue listed below.          Recommendations for follow up therapy are one component of a multi-disciplinary discharge planning process, led by the attending physician.  Recommendations may be updated based on patient status, additional functional criteria and insurance authorization.  Follow Up Recommendations Home health PT (vestibular rehab)      Assistance Recommended at Discharge PRN  Patient can return home with the following  A little help with walking and/or  transfers;Help with stairs or ramp for entrance    Equipment Recommendations Rollator (4 wheels)  Recommendations for Other Services       Functional Status Assessment Patient has had a recent decline in their functional status and demonstrates the ability to make significant improvements in function in a reasonable and predictable amount of time.     Precautions / Restrictions Precautions Precautions: Fall Restrictions Weight Bearing Restrictions: No      Mobility  Bed Mobility Overal bed mobility: Independent                  Transfers Overall transfer level: Independent                 General transfer comment: put her shoes on Prior to walking    Ambulation/Gait Ambulation/Gait assistance: Min guard Gait Distance (Feet): 450 Feet Assistive device: None Gait Pattern/deviations: Step-through pattern, Decreased stride length, Drifts right/left, Staggering left   Gait velocity interpretation: 1.31 - 2.62 ft/sec, indicative of limited community ambulator   General Gait Details: Pt progressing ambulation in hallway with LOB occasionally with challenges with pt self correcting.  Stairs Stairs: Yes Stairs assistance: Min guard Stair Management: One rail Right, Forwards, Step to pattern Number of Stairs: 3    Wheelchair Mobility    Modified Rankin (Stroke Patients Only)       Balance Overall balance assessment: Needs assistance Sitting-balance support: No upper extremity supported, Feet supported Sitting balance-Leahy Scale: Good     Standing balance support: No upper extremity supported, During functional activity Standing balance-Leahy Scale: Fair Standing balance comment: stands statically without LOB but dynamically with incr challenges, needs  Ue support for balance                 Standardized Balance Assessment Standardized Balance Assessment : Dynamic Gait Index   Dynamic Gait Index Level Surface: Normal Change in Gait Speed:  Normal Gait with Horizontal Head Turns: Mild Impairment Gait with Vertical Head Turns: Normal Gait and Pivot Turn: Mild Impairment Step Over Obstacle: Mild Impairment Step Around Obstacles: Normal Steps: Moderate Impairment Total Score: 19       Pertinent Vitals/Pain Pain Assessment Pain Assessment: No/denies pain    Home Living Family/patient expects to be discharged to:: Private residence Living Arrangements: Non-relatives/Friends Available Help at Discharge: Friend(s);Available PRN/intermittently (available in pm) Type of Home: House Home Access: Level entry       Home Layout: One level Home Equipment: None      Prior Function Prior Level of Function : Independent/Modified Independent;Driving                     Hand Dominance        Extremity/Trunk Assessment   Upper Extremity Assessment Upper Extremity Assessment: Defer to OT evaluation    Lower Extremity Assessment Lower Extremity Assessment: Generalized weakness    Cervical / Trunk Assessment Cervical / Trunk Assessment: Normal  Communication   Communication: Interpreter utilized [employer] [phone])  Cognition Arousal/Alertness: Awake/alert Behavior During Therapy: WFL for tasks assessed/performed Overall Cognitive Status: Within Functional Limits for tasks assessed                                          General Comments General comments (skin integrity, edema, etc.): VSS, incidental finding of right hypofunction of vestibular system. intiiated x 1 exercises with pt.    Exercises Other Exercises Other Exercises: x1 exercises   Assessment/Plan    PT Assessment Patient needs continued PT services  PT Problem List Decreased activity tolerance;Decreased balance;Decreased mobility;Decreased knowledge of use of DME;Decreased safety awareness;Decreased knowledge of precautions       PT Treatment Interventions DME instruction;Gait training;Functional mobility  training;Therapeutic activities;Therapeutic exercise;Balance training;Patient/family education    PT Goals (Current goals can be found in the Care Plan section)  Acute Rehab PT Goals Patient Stated Goal: to go home PT Goal Formulation: With patient Time For Goal Achievement: 08/06/22 Potential to Achieve Goals: Fair    Frequency Min 3X/week     Co-evaluation               AM-PAC PT "6 Clicks" Mobility  Outcome Measure Help needed turning from your back to your side while in a flat bed without using bedrails?: None Help needed moving from lying on your back to sitting on the side of a flat bed without using bedrails?: None Help needed moving to and from a bed to a chair (including a wheelchair)?: A Little Help needed standing up from a chair using your arms (e.g., wheelchair or bedside chair)?: A Little Help needed to walk in hospital room?: A Little Help needed climbing 3-5 steps with a railing? : A Lot 6 Click Score: 19    End of Session Equipment Utilized During Treatment: Gait belt Activity Tolerance: Patient tolerated treatment well Patient left: in chair;with call bell/phone within reach;with family/visitor present Nurse Communication: Mobility status PT Visit Diagnosis: Dizziness and giddiness (R42);Unsteadiness on feet (R26.81)    Time: 2992-4268 PT Time Calculation (min) (ACUTE ONLY): 30 min  Charges:   PT Evaluation $PT Eval Moderate Complexity: 1 Mod PT Treatments $Gait Training: 8-22 mins        [facility] M,PT Acute Rehab Services [place] 07/23/2022, 4:20 PM

## 2022-07-23 NOTE — Consult Note (Signed)
Reason for Consult: Diabetes insipidus Referring Physician:    Chief Complaint: Dr. 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 May be more difficult to titrate with intranasal DDAVP because of the lowest concentration available but usually pts require 43mg BID.  - Would hold the DDAVP today and will reassess SNa tomorrow as well as UOP and urine osmolality.  - Hopefully will be able to restart DDAVP tomorrow but will start with 0.05 mg PO QHS and titrate up as needed based on SNa and urine osmolality. - Low solute diet will help as well; would not start a thiazide at this time. Can always start the thiazide outpt if needed to control the UOP. 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 female migraines, hypothyroidism presenting with altered mental status but then stopped talking and following commands on 11/2. She reportedly has had worsening migraines over the past mth, was noted to be febrile with a temp of 101, tachycardic and agitated. 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 CT head shows an enlarging sellar/suprasellar mass, MRI showed a 1.1 x 1.8 x 1.9 cm centrally cystic and peripherally enhancing lesion occupying the sella turcica and extending into the suprasellar cistern. Patient treated with intranasal DDAVP 438m injection IV, 1060mon 11/4 and then 58m25mn 11/5. Help 11/6 because of decreasing SNa. Positive mild headaches, sometimes diplopia, a lot of UOP for 2 mths, some nausea; denies shortness of breath, dysuria, NSAID use.   ROS Pertinent items are noted in  HPI.  Chemistry and CBC: Creatinine, Ser  Date/Time Value Ref Range Status  07/23/2022 02:54 AM 0.48 0.44 - 1.00 mg/dL Final  07/22/2022 07:13 AM 0.48 0.44 - 1.00 mg/dL Final  07/21/2022 06:58 AM 0.66 0.44 - 1.00 mg/dL Final  07/21/2022 12:11 AM 0.72 0.44 - 1.00 mg/dL Final  07/20/2022 08:24 PM 0.68 0.44 - 1.00 mg/dL Final  07/20/2022 03:30 AM 0.73 0.44 - 1.00 mg/dL Final  07/19/2022 04:40 PM 0.94 0.44 - 1.00 mg/dL Final  05/01/2022 11:03 PM 0.74 0.44 - 1.00 mg/dL Final  11/01/2021 09:08 PM 0.72 0.44 - 1.00 mg/dL Final  02/27/2021 09:41 AM 0.73 0.57 - 1.00 mg/dL Final  03/31/2018 03:42 PM 0.90 0.57 - 1.00 mg/dL Final  11/06/2017 12:07 PM 0.71 0.57 - 1.00 mg/dL Final  06/10/2017 12:20 PM 0.68 0.57 - 1.00 mg/dL Final   Recent Labs  Lab 07/19/22 1640 07/20/22 0330 07/20/22 2024 07/21/22 0011 07/21/22 0546 07/21/22 0658 07/21/22 1213 07/21/22 1809 07/21/22 2038 07/22/22 0103 07/22/22 0713 07/22/22 1333 07/22/22 1744 07/23/22 0254  NA 135 149* 151* 147*   < > 151* 149* 145  --  140 134* 132* 131* 131*  K 4.5 4.3 2.8* 2.9*  --  3.4*  --   --   --   --  2.9*  --   --  3.8  CL 101 122* 118* 119*  --  118*  --   --   --   --  103  --   --  101  CO2 16* '24 23 23  '$ --  23  --   --   --   --  22  --   --  19*  GLUCOSE 94 195* 195* 200*  --  118*  --   --   --   --  95  --   --  93  BUN '12 11 10 7  '$ --  7  --   --   --   --  7  --   --  10  CREATININE 0.94 0.73 0.68 0.72  --  0.66  --   --   --   --  0.48  --   --  0.48  CALCIUM 9.4 8.9 8.6* 8.6*  --  9.0  --   --   --   --  8.2*  --   --  8.6*  PHOS  --  1.6* 3.1 1.4*  --   --   --   --  4.1  --  3.4  --   --  3.6   < > = values in this interval not displayed.   Recent Labs  Lab 07/19/22 1640 07/20/22 0330 07/21/22 0011 07/22/22 0713 07/23/22 0254  WBC 8.1 3.8* 14.5* 11.3* 6.2  NEUTROABS 5.1  --   --   --   --   HGB 14.9 11.7* 10.6* 9.2* 10.4*  HCT 44.7 34.5* 30.8* 28.4* 30.7*  MCV 90.3 89.8 90.1 92.8 90.0  PLT 198 134*  141* 124* 144*   Liver Function Tests: Recent Labs  Lab 07/19/22 1640  AST 29  ALT 21  ALKPHOS 48  BILITOT 2.0*  PROT 7.3  ALBUMIN 3.3*   No results for input(s): "LIPASE", "AMYLASE" in the last 168 hours. No results for input(s): "AMMONIA" in the last 168 hours. Cardiac Enzymes: Recent Labs  Lab 07/19/22 1640  CKTOTAL 85   Iron Studies: No results for input(s): "IRON", "TIBC", "TRANSFERRIN", "FERRITIN" in the last 72 hours. PT/INR: '@LABRCNTIP'$ (inr:5)  Xrays/Other Studies: ) Results for orders placed or performed during the hospital encounter of 07/19/22 (from the past 48 hour(s))  Glucose, capillary     Status: Abnormal   Collection Time: 07/21/22 11:19 AM  Result Value Ref Range   Glucose-Capillary 120 (H) 70 - 99 mg/dL    Comment: Glucose reference range applies only to samples taken after fasting for at least 8 hours.  Osmolality, urine     Status: Abnormal   Collection Time: 07/21/22 11:47 AM  Result Value Ref Range   Osmolality, Ur 175 (L) 300 - 900 mOsm/kg    Comment: RESULT REPEATED AND VERIFIED Performed at [facility], [address] [place], [place] [address]   Urinalysis, Routine w reflex microscopic     Status: Abnormal   Collection Time: 07/21/22 11:47 AM  Result Value Ref Range   Color, Urine STRAW (A) YELLOW   APPearance CLEAR CLEAR   Specific Gravity, Urine 1.004 (L) 1.005 - 1.030   pH 7.0 5.0 - 8.0   Glucose, UA NEGATIVE NEGATIVE mg/dL   Hgb urine dipstick NEGATIVE NEGATIVE   Bilirubin Urine NEGATIVE NEGATIVE   Ketones, ur NEGATIVE NEGATIVE mg/dL   Protein, ur NEGATIVE NEGATIVE mg/dL   Nitrite NEGATIVE NEGATIVE   Leukocytes,Ua NEGATIVE NEGATIVE    Comment: Performed at [facility] [address] [place], [place] [address]  Serum Sodium     Status: Abnormal   Collection Time: 07/21/22 12:13 PM  Result Value Ref Range   Sodium 149 (H) 135 - 145 mmol/L    Comment: Performed at [facility] [address],  [place], [place] [address]  T4, free  Status: Abnormal   Collection Time: 07/21/22 12:13 PM  Result Value Ref Range   Free T4 1.86 (H) 0.61 - 1.12 ng/dL    Comment: (NOTE) Biotin ingestion may interfere with free T4 tests. If the results are inconsistent with the TSH level, previous test results, or the clinical presentation, then consider biotin interference. If needed, order repeat testing after stopping biotin. Performed at [facility], [place] [address] [place], [place] [address]   Glucose, capillary     Status: Abnormal   Collection Time: 07/21/22  3:29 PM  Result Value Ref Range   Glucose-Capillary 169 (H) 70 - 99 mg/dL    Comment: Glucose reference range applies only to samples taken after fasting for at least 8 hours.  Osmolality, urine     Status: Abnormal   Collection Time: 07/21/22  5:18 PM  Result Value Ref Range   Osmolality, Ur 257 (L) 300 - 900 mOsm/kg    Comment: RESULT REPEATED AND VERIFIED Performed at [facility], [place] [address] [place], [place] [address]   Serum Sodium     Status: None   Collection Time: 07/21/22  6:09 PM  Result Value Ref Range   Sodium 145 135 - 145 mmol/L    Comment: Performed at [facility], [place] [address] [place] [address], [place], [place] [address]  Glucose, capillary     Status: Abnormal   Collection Time: 07/21/22  7:55 PM  Result Value Ref Range   Glucose-Capillary 127 (H) 70 - 99 mg/dL    Comment: Glucose reference range applies only to samples taken after fasting for at least 8 hours.  phosphorus level     Status: None   Collection Time: 07/21/22  8:38 PM  Result Value Ref Range   Phosphorus 4.1 2.5 - 4.6 mg/dL    Comment: Performed at [facility], [place] [address], [place], [place] [address]  Glucose, capillary     Status: Abnormal   Collection Time: 07/21/22 11:57 PM  Result Value Ref Range   Glucose-Capillary 153 (H) 70 - 99 mg/dL    Comment: Glucose reference range applies only to samples taken after fasting for  at least 8 hours.  Osmolality, urine     Status: None   Collection Time: 07/22/22 12:31 AM  Result Value Ref Range   Osmolality, Ur 483 300 - 900 mOsm/kg    Comment: RESULT REPEATED AND VERIFIED Performed at [facility], [place] [address] [place], [place] [address]   Urinalysis, Complete w Microscopic     Status: None   Collection Time: 07/22/22 12:31 AM  Result Value Ref Range   Color, Urine YELLOW YELLOW   APPearance CLEAR CLEAR   Specific Gravity, Urine 1.012 1.005 - 1.030   pH 7.0 5.0 - 8.0   Glucose, UA NEGATIVE NEGATIVE mg/dL   Hgb urine dipstick NEGATIVE NEGATIVE   Bilirubin Urine NEGATIVE NEGATIVE   Ketones, ur NEGATIVE NEGATIVE mg/dL   Protein, ur NEGATIVE NEGATIVE mg/dL   Nitrite NEGATIVE NEGATIVE   Leukocytes,Ua NEGATIVE NEGATIVE   RBC / HPF 0-5 0 - 5 RBC/hpf   WBC, UA 0-5 0 - 5 WBC/hpf   Bacteria, UA NONE SEEN NONE SEEN   Squamous Epithelial / LPF 0-5 0 - 5    Comment: Performed at [facility], [place] [address] [place], [place] [address]  Serum Sodium     Status: None   Collection Time: 07/22/22  1:03 AM  Result Value Ref Range   Sodium  140 135 - 145 mmol/L    Comment: Performed at [facility], [place] [address], [place], [place] [address]  Glucose, capillary     Status: Abnormal   Collection Time: 07/22/22  3:38 AM  Result Value Ref Range   Glucose-Capillary 110 (H) 70 - 99 mg/dL    Comment: Glucose reference range applies only to samples taken after fasting for at least 8 hours.  Osmolality, urine     Status: None   Collection Time: 07/22/22  6:17 AM  Result Value Ref Range   Osmolality, Ur 514 300 - 900 mOsm/kg    Comment: REPEATED TO VERIFY Performed at [facility], [place] [place], [place] [address]   Basic metabolic panel     Status: Abnormal   Collection Time: 07/22/22  7:13 AM  Result Value Ref Range   Sodium 134 (L) 135 - 145 mmol/L   Potassium 2.9 (L) 3.5 - 5.1 mmol/L   Chloride 103 98 - 111 mmol/L   CO2 22  22 - 32 mmol/L   Glucose, Bld 95 70 - 99 mg/dL    Comment: Glucose reference range applies only to samples taken after fasting for at least 8 hours.   BUN 7 6 - 20 mg/dL   Creatinine, Ser 0.48 0.44 - 1.00 mg/dL   Calcium 8.2 (L) 8.9 - 10.3 mg/dL   GFR, Estimated >60 >60 mL/min    Comment: (NOTE) Calculated using the CKD-EPI Creatinine Equation (2021)    Anion gap 9 5 - 15    Comment: Performed at [facility] [address], [place], [place] [address]  CBC     Status: Abnormal   Collection Time: 07/22/22  7:13 AM  Result Value Ref Range   WBC 11.3 (H) 4.0 - 10.5 K/uL   RBC 3.06 (L) 3.87 - 5.11 MIL/uL   Hemoglobin 9.2 (L) 12.0 - 15.0 g/dL   HCT 28.4 (L) 36.0 - 46.0 %   MCV 92.8 80.0 - 100.0 fL   MCH 30.1 26.0 - 34.0 pg   MCHC 32.4 30.0 - 36.0 g/dL   RDW 11.9 11.5 - 15.5 %   Platelets 124 (L) 150 - 400 K/uL    Comment: REPEATED TO VERIFY   nRBC 0.0 0.0 - 0.2 %    Comment: Performed at [facility], [place] [address] [place], [place] [address]  Magnesium     Status: Abnormal   Collection Time: 07/22/22  7:13 AM  Result Value Ref Range   Magnesium 1.4 (L) 1.7 - 2.4 mg/dL    Comment: Performed at [facility] [address] [place], [place] [address]  Phosphorus     Status: None   Collection Time: 07/22/22  7:13 AM  Result Value Ref Range   Phosphorus 3.4 2.5 - 4.6 mg/dL    Comment: Performed at [facility] [address] [place], [place] [address]  Cortisol-am, blood     Status: Abnormal   Collection Time: 07/22/22  7:13 AM  Result Value Ref Range   Cortisol - AM 1.5 (L) 6.7 - 22.6 ug/dL    Comment: Performed at [facility] [address] [place], [place] [address]  ACTH stimulation, 3 time points     Status: None   Collection Time: 07/22/22  9:10 AM  Result Value Ref Range   Cortisol, Base 1.0 ug/dL    Comment: NO NORMAL RANGE ESTABLISHED FOR THIS TEST   Cortisol, 30 Min  16.1 ug/dL   Cortisol, 60 Min 19.1 ug/dL    Comment: Performed  at [facility], [place] [address], [place], [place] [address]  Urinalysis, Complete w Microscopic     Status: Abnormal   Collection Time: 07/22/22 11:25 AM  Result Value Ref Range   Color, Urine STRAW (A) YELLOW   APPearance CLEAR CLEAR   Specific Gravity, Urine 1.010 1.005 - 1.030   pH 8.0 5.0 - 8.0   Glucose, UA NEGATIVE NEGATIVE mg/dL   Hgb urine dipstick NEGATIVE NEGATIVE   Bilirubin Urine NEGATIVE NEGATIVE   Ketones, ur NEGATIVE NEGATIVE mg/dL   Protein, ur NEGATIVE NEGATIVE mg/dL   Nitrite NEGATIVE NEGATIVE   Leukocytes,Ua NEGATIVE NEGATIVE   RBC / HPF 0-5 0 - 5 RBC/hpf   WBC, UA 0-5 0 - 5 WBC/hpf   Bacteria, UA NONE SEEN NONE SEEN   Squamous Epithelial / LPF 0-5 0 - 5    Comment: Performed at [facility], [place] [address] [place], [place] [address]  Sodium     Status: Abnormal   Collection Time: 07/22/22  1:33 PM  Result Value Ref Range   Sodium 132 (L) 135 - 145 mmol/L    Comment: Performed at [facility], [place] [address], [place], [place] [address]  Sodium     Status: Abnormal   Collection Time: 07/22/22  5:44 PM  Result Value Ref Range   Sodium 131 (L) 135 - 145 mmol/L    Comment: Performed at [facility], [place] [address] [place], [place] [address]  Urinalysis, Complete w Microscopic Urine, Clean Catch     Status: Abnormal   Collection Time: 07/22/22  9:01 PM  Result Value Ref Range   Color, Urine YELLOW YELLOW   APPearance HAZY (A) CLEAR   Specific Gravity, Urine 1.011 1.005 - 1.030   pH 8.0 5.0 - 8.0   Glucose, UA NEGATIVE NEGATIVE mg/dL   Hgb urine dipstick NEGATIVE NEGATIVE   Bilirubin Urine NEGATIVE NEGATIVE   Ketones, ur NEGATIVE NEGATIVE mg/dL   Protein, ur NEGATIVE NEGATIVE mg/dL   Nitrite NEGATIVE NEGATIVE   Leukocytes,Ua NEGATIVE NEGATIVE   RBC / HPF 0-5 0 - 5 RBC/hpf   WBC, UA 0-5 0 - 5 WBC/hpf   Bacteria, UA NONE SEEN NONE SEEN   Squamous Epithelial / LPF 0-5 0 - 5    Comment: Performed at [facility], [place]  [address] [place], [place] [address]  Basic metabolic panel     Status: Abnormal   Collection Time: 07/23/22  2:54 AM  Result Value Ref Range   Sodium 131 (L) 135 - 145 mmol/L   Potassium 3.8 3.5 - 5.1 mmol/L   Chloride 101 98 - 111 mmol/L   CO2 19 (L) 22 - 32 mmol/L   Glucose, Bld 93 70 - 99 mg/dL    Comment: Glucose reference range applies only to samples taken after fasting for at least 8 hours.   BUN 10 6 - 20 mg/dL   Creatinine, Ser 0.48 0.44 - 1.00 mg/dL   Calcium 8.6 (L) 8.9 - 10.3 mg/dL   GFR, Estimated >60 >60 mL/min    Comment: (NOTE) Calculated using the CKD-EPI Creatinine Equation (2021)    Anion gap 11 5 - 15    Comment: Performed at [facility] [address], [place], [place] [address]  CBC     Status: Abnormal   Collection Time: 07/23/22  2:54 AM  Result Value Ref  Range   WBC 6.2 4.0 - 10.5 K/uL   RBC 3.41 (L) 3.87 - 5.11 MIL/uL   Hemoglobin 10.4 (L) 12.0 - 15.0 g/dL   HCT 30.7 (L) 36.0 - 46.0 %   MCV 90.0 80.0 - 100.0 fL   MCH 30.5 26.0 - 34.0 pg   MCHC 33.9 30.0 - 36.0 g/dL   RDW 11.4 (L) 11.5 - 15.5 %   Platelets 144 (L) 150 - 400 K/uL   nRBC 0.0 0.0 - 0.2 %    Comment: Performed at [facility], [place] [address] [place], [place] [address]  Magnesium     Status: None   Collection Time: 07/23/22  2:54 AM  Result Value Ref Range   Magnesium 2.1 1.7 - 2.4 mg/dL    Comment: Performed at [facility] [address] [place], [place] [address]  Phosphorus     Status: None   Collection Time: 07/23/22  2:54 AM  Result Value Ref Range   Phosphorus 3.6 2.5 - 4.6 mg/dL    Comment: Performed at [facility] [address], [place], [place] [address]  Cortisol     Status: None   Collection Time: 07/23/22  2:54 AM  Result Value Ref Range   Cortisol, Plasma 6.6 ug/dL    Comment: (NOTE) AM    6.7 - 22.6 ug/dL PM   <10.0       ug/dL Performed at [facility] [address], [place],  [address]    No results found.  PMH:    Past Medical History:  Diagnosis Date   Overweight (BMI 25.0-29.9) 06/10/2017   Thyroid disease 02/21/2015   Grave's disease resulting in total thyroidectomy    PSH:   Past Surgical History:  Procedure Laterality Date   THERAPEUTIC ABORTION  06/2020   At 6 weeks--oral meds did not work   THYROIDECTOMY Bilateral 02/21/2015   For Grave's Disease not well controlled on medication    Allergies: No Known Allergies  Medications:   Prior to Admission medications   Medication Sig Start Date End Date Taking? Authorizing Provider  calcium citrate-vitamin D 500-400 MG-UNIT chewable tablet 1 tab by mouth twice daily. Patient taking differently: Chew 1 tablet by mouth 2 (two) times daily. 1 tab by mouth twice daily. 12/29/20  Yes [name], MD  cholecalciferol (VITAMIN D3) 25 MCG (1000 UNIT) tablet Take 1,000 Units by mouth daily.   Yes [provider]  cyclobenzaprine (FLEXERIL) 5 MG tablet Take 5 mg by mouth at bedtime as needed for muscle spasms. 06/30/22  Yes [provider]  levothyroxine (SYNTHROID) 125 MCG tablet TAKE 1 TABLET BY MOUTH EVERY DAY BEFORE BREAKFAST Patient taking differently: Take 125 mcg by mouth daily before breakfast. 06/14/22  Yes [name], MD  Magnesium 200 MG TABS Take 1 tablet by mouth daily.   Yes [provider]  Potassium 99 MG TABS Take 1 tablet by mouth daily.   Yes [provider]  vitamin C (ASCORBIC ACID) 250 MG tablet Take 250 mg by mouth daily.   Yes [provider]  cetirizine (ZYRTEC) 10 MG tablet Take 1 tablet (10 mg total) by mouth daily. Patient not taking: Reported on 07/17/2022 05/15/19   [name], MD  ondansetron [facility]) 4 MG tablet 1 to 2 tabs by mouth every 8 hours as needed for nausea and vomiting Patient not taking: Reported on 07/22/2022 07/17/22   [name], MD  topiramate (TOPAMAX) 25 MG tablet 1  tab by mouth at bedtime Patient taking differently: Take 25 mg  by mouth at bedtime. 07/17/22   [name], MD    Discontinued Meds:   Medications Discontinued During This Encounter  Medication Reason   vancomycin (VANCOCIN) IVPB 1000 mg/200 mL premix    lactated ringers infusion    vancomycin (VANCOCIN) IVPB 1000 mg/200 mL premix    Chlorhexidine Gluconate Cloth 2 % PADS 6 each    dexmedetomidine (PRECEDEX) 400 MCG/100ML (4 mcg/mL) infusion    norepinephrine (LEVOPHED) '4mg'$  in 243m (0.016 mg/mL) premix infusion    Oral care mouth rinse    Oral care mouth rinse    acyclovir (ZOVIRAX) 770 mg in dextrose 5 % 250 mL IVPB    lactated ringers infusion    ceFEPIme (MAXIPIME) 2 g in sodium chloride 0.9 % 100 mL IVPB    vancomycin (VANCOREADY) IVPB 750 mg/150 mL    enoxaparin (LOVENOX) injection 40 mg    vancomycin (VANCOCIN) IVPB 1000 mg/200 mL premix    metroNIDAZOLE (FLAGYL) IVPB 500 mg    dextrose 5 % solution    0.9 %  sodium chloride infusion    lactated ringers infusion    acetaminophen (TYLENOL) suppository 650 mg    insulin aspart (novoLOG) injection 0-9 Units    ibuprofen (ADVIL) 800 MG tablet Patient Preference   desmopressin (DDAVP NASAL) 0.01 % solution 10 mcg     Social History:  reports that she has never smoked. She has never been exposed to tobacco smoke. She has never used smokeless tobacco. She reports that she does not currently use alcohol. She reports that she does not use drugs.  Family History:   Family History  Problem Relation Age of Onset   Diabetes Mother    Fibromyalgia Mother    Heart disease Mother        age 779and 515  Diabetes Father    ADD / ADHD Brother     Blood pressure (!) 119/93, pulse 75, temperature 97.8 F (36.6 C), temperature source Oral, resp. rate 17, height '5\' 6"'$  (1.676 m), weight 76.7 kg, last menstrual period 04/05/2021, SpO2 90 %, unknown if currently breastfeeding. GEN: NAD, A&Ox3, NCAT, pleasant HEENT: No conjunctival pallor, EOMI NECK: Supple, no thyromegaly LUNGS: CTA B/L no  rales, rhonchi or wheezing CV: RRR, No M/R/G ABD: SNDNT +BS  EXT: No lower extremity edema         Sara Mathis, JHunt Oris MD 07/23/2022, 9:02 AM

## 2022-07-23 NOTE — Progress Notes (Signed)
PROGRESS NOTE                                                                                                                                                                                                             Patient Demographics:    Sara Mathis, is a 36 y.o. female, DOB - Sep 04, 1986, JOI:[id number]  Outpatient Primary MD for the patient is [name], MD    LOS - 4  Admit date - 07/19/2022    Chief Complaint  Patient presents with   Altered Mental Status       Brief Narrative (HPI from H&P)   36 y.o. F with PMH significant for migraines and hypothyroidism who presented to the ED with acute altered mental status.  Per her roommate, she was in her normal state of health 11/1, then stopped talking and following commands the next day.  She has had increased migraine headaches for the last month.  She was initially febrile to 101F, tachycardic and agitated.  Initial labs showed lactic acid of 3.1, no leukocytosis, TSH 0.065, normal ethanol, UDS pending.  UA and CXR without clear source of infection.  She was given 3L IVF, broad spectrum abx and head CT/LP pending.  Her agitation required precedex infusion, so PCCM consulted for admission, she was kept in ICU stabilized transferred to hospitalist service on 07/23/2022 on day 4 of her hospital stay.   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  Seen by neurosurgery, PCCM and ID.  Clinically meningitis has been ruled out, defer stoppage of antibiotics to ID, currently has undergone cosyntropin stimulation test which was borderline with initial cortisol around 1 in the 90-minute cortisol being 19, she has been  placed on hydrocortisone along with Synthroid by PCCM team after discussions with endocrine at [facility].  Will continue to monitor her for another 1 to 2 days here thereafter outpatient endocrine follow-up.  Have requested urgent outpatient endocrine consultation via epic.  Patient currently symptom-free, continue to monitor closely.  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  Renal consulted will monitor.  History  of thyroidectomy post Graves' disease.  Now hypothyroid.  Continue Synthroid.  TSH stable.      Condition - Fair  Family Communication  :  None  Code Status :  Full  Consults  :  ID, NS, PCCM, Renal  PUD Prophylaxis :     Procedures  :      11/2 presented to ED altered, tachycardic and febrile, concern for sepsis and meningitis, on precedex, got Cefepime, Flagyl, Vanc added acyclovir empirically until LP done and resulted 11/4 vanc and MTZ stopped. Na 151 got DDAVP and free water. Nasal DDAVP started. Spoke to [facility] who referrer to endocrine guidelines  for management of panhypopit. 11/5: Cosyntropin stim test completed - borderline.  Stop D5 water.  Moved to medical ward      Disposition Plan  :    Status is: Inpatient  DVT Prophylaxis  :    SCDs Start: 07/19/22 2102    Lab Results  Component Value Date   PLT 144 (L) 07/23/2022    Diet :  Diet Order             Diet regular Room service appropriate? Yes; Fluid consistency: Thin  Diet effective now                    Inpatient Medications  Scheduled Meds:  Chlorhexidine Gluconate Cloth  6 each Topical Q0600   desmopressin  10 mcg Nasal QHS   hydrocortisone  15 mg Oral Daily   hydrocortisone  5 mg Oral Q24H   levothyroxine  125 mcg Oral Q0600   mouth rinse  15 mL Mouth Rinse 4 times per day   Continuous Infusions: PRN Meds:.acetaminophen, docusate sodium, ondansetron (ZOFRAN) IV, mouth rinse, polyethylene glycol    Objective:   Vitals:   07/23/22  0355 07/23/22 0500 07/23/22 0729 07/23/22 1213  BP:    (!) 115/93  Pulse:    (!) 107  Resp:  17  15  Temp:   97.8 F (36.6 C) 97.9 F (36.6 C)  TempSrc:   Oral Oral  SpO2:    97%  Weight: 76.7 kg     Height:        Wt Readings from Last 3 Encounters:  07/23/22 76.7 kg  07/17/22 77.1 kg  06/14/22 79.4 kg     Intake/Output Summary (Last 24 hours) at 07/23/2022 1259 Last data filed at 07/23/2022 0800 Gross per 24 hour  Intake 468.94 ml  Output 2325 ml  Net -1856.06 ml     Physical Exam  Awake Alert, No new F.N deficits, Normal affect [place].AT,PERRAL Supple Neck, No JVD,   Symmetrical Chest wall movement, Good air movement bilaterally, CTAB RRR,No Gallops,Rubs or new Murmurs,  +ve B.Sounds, Abd Soft, No tenderness,   No Cyanosis, Clubbing or edema      Data Review:    CBC Recent Labs  Lab 07/19/22 1640 07/20/22 0330 07/21/22 0011 07/22/22 0713 07/23/22 0254  WBC 8.1 3.8* 14.5* 11.3* 6.2  HGB 14.9 11.7* 10.6* 9.2* 10.4*  HCT 44.7 34.5* 30.8* 28.4* 30.7*  PLT 198 134* 141* 124* 144*  MCV 90.3 89.8 90.1 92.8 90.0  MCH 30.1 30.5 31.0 30.1 30.5  MCHC 33.3 33.9 34.4 32.4 33.9  RDW 11.6 11.6 11.9 11.9 11.4*  LYMPHSABS 2.2  --   --   --   --   MONOABS 0.7  --   --   --   --   EOSABS 0.0  --   --   --   --  BASOSABS 0.0  --   --   --   --     Electrolytes Recent Labs  Lab 07/19/22 1640 07/19/22 1916 07/19/22 2230 07/20/22 0016 07/20/22 0330 07/20/22 2024 07/21/22 0011 07/21/22 0546 07/21/22 0658 07/21/22 1213 07/22/22 0103 07/22/22 0713 07/22/22 1333 07/22/22 1744 07/23/22 0254  NA 135  --   --   --  149* 151* 147*   < > 151*   < > 140 134* 132* 131* 131*  K 4.5  --   --   --  4.3 2.8* 2.9*  --  3.4*  --   --  2.9*  --   --  3.8  CL 101  --   --   --  122* 118* 119*  --  118*  --   --  103  --   --  101  CO2 16*  --   --   --  '24 23 23  '$ --  23  --   --  22  --   --  19*  GLUCOSE 94  --   --   --  195* 195* 200*  --  118*  --   --  95  --   --  93   BUN 12  --   --   --  '11 10 7  '$ --  7  --   --  7  --   --  10  CREATININE 0.94  --   --   --  0.73 0.68 0.72  --  0.66  --   --  0.48  --   --  0.48  CALCIUM 9.4  --   --   --  8.9 8.6* 8.6*  --  9.0  --   --  8.2*  --   --  8.6*  AST 29  --   --   --   --   --   --   --   --   --   --   --   --   --   --   ALT 21  --   --   --   --   --   --   --   --   --   --   --   --   --   --   ALKPHOS 48  --   --   --   --   --   --   --   --   --   --   --   --   --   --   BILITOT 2.0*  --   --   --   --   --   --   --   --   --   --   --   --   --   --   ALBUMIN 3.3*  --   --   --   --   --   --   --   --   --   --   --   --   --   --   MG  --   --   --   --  1.9  --  1.9  --   --   --   --  1.4*  --   --  2.1  LATICACIDVEN 3.1* 5.0* 2.0* 1.1  --   --   --   --   --   --   --   --   --   --   --  INR 1.2  --   --   --   --   --   --   --   --   --   --   --   --   --   --   TSH 0.065*  --   --   --   --   --   --   --   --   --   --   --   --   --   --   HGBA1C  --   --   --  4.2*  --   --   --   --   --   --   --   --   --   --   --    < > = values in this interval not displayed.    ------------------------------------------------------------------------------------------------------------------ No results for input(s): "CHOL", "HDL", "LDLCALC", "TRIG", "CHOLHDL", "LDLDIRECT" in the last 72 hours.  Lab Results  Component Value Date   HGBA1C 4.2 (L) 07/20/2022      Radiology Reports MR BRAIN W WO CONTRAST  Result Date: 07/20/2022 CLINICAL DATA:  Provided history: Mental status change unknown cause. EXAM: MRI HEAD WITHOUT AND WITH CONTRAST TECHNIQUE: Multiplanar, multiecho pulse sequences of the brain and surrounding structures were obtained without and with intravenous contrast. CONTRAST:  7.34m GADAVIST GADOBUTROL 1 MMOL/ML IV SOLN COMPARISON:  Prior head CT examinations 07/19/2022 and earlier. FINDINGS: Brain: Cerebral volume is normal. There is a centrally cystic, peripherally enhancing  lesion occupying the sella and extending into the suprasellar cistern, measuring 1.1 x 1.8 x 1.9 cm. There are apparent chronic blood products along the periphery of the lesion. The pituitary gland is poorly delineated separately from the lesion. The pituitary stalk is displaced superiorly and posteriorly (for instance as seen on series 15, image 8). The lesion encroaches upon the optic apparatus (for instance as seen on series 13, image 9). No evidence of cavernous sinus extension. No cortical encephalomalacia is identified. No significant cerebral white matter disease. There is no acute infarct. No extra-axial fluid collection. No midline shift. Vascular: Maintained flow voids within the proximal large arterial vessels. Skull and upper cervical spine: No focal suspicious marrow lesion. Sinuses/Orbits: Mild mucosal thickening within the bilateral ethmoid, sphenoid and maxillary sinuses. 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 There are apparent chronic blood products along the periphery of this lesion. Favored differential considerations include a cystic/necrotic pituitary macroadenoma, sequela of pituitary infarct/hemorrhage (as can be seen in the setting of pituitary apoplexy) or large Rathke's cyst (with prior hemorrhage). Craniopharyngioma is considered less likely as these lesions are not typically cystic in adult patients. The lesion encroaches upon the optic apparatus. No evidence of cavernous sinus extension. Otherwise unremarkable MRI appearance of the brain. Mild mucosal thickening within the paranasal sinuses. Electronically Signed   By: KKellie SimmeringD.O.   On: 07/20/2022 16:21   CT Head Wo Contrast  Result Date: 07/19/2022 CLINICAL DATA:  Altered mental status. EXAM: CT HEAD WITHOUT CONTRAST TECHNIQUE: Contiguous axial images were obtained from the base of the skull through  the vertex without intravenous contrast. RADIATION DOSE REDUCTION: This exam was performed according to the departmental dose-optimization program which includes automated exposure control, adjustment of the mA and/or kV according to patient size and/or use of iterative reconstruction technique. COMPARISON:  Head CT dated 05/02/2022. FINDINGS: Brain: The ventricles and  sulci are appropriate size for the patient's age. The gray-white matter discrimination is preserved. There is no acute intracranial hemorrhage. There is an enlarging sellar/suprasellar mass measuring 1.9 x 1.4 cm in greatest axial dimensions and approximately 2 cm in craniocaudal length. Further evaluation with pituitary mass protocol MRI recommended. No mass effect or midline shift. No extra-axial fluid collection. Vascular: No hyperdense vessel or unexpected calcification. Skull: Normal. Negative for fracture or focal lesion. Sinuses/Orbits: Mild mucoperiosteal thickening of paranasal sinuses. No air-fluid level. The mastoid air cells are clear. Other: None IMPRESSION: 1. No acute intracranial hemorrhage. 2. Enlarging sellar/suprasellar mass. Further evaluation with pituitary mass protocol MRI recommended. Electronically Signed   By: Anner Crete M.D.   On: 07/19/2022 19:07   DG Chest Port 1 View  Result Date: 07/19/2022 CLINICAL DATA:  Altered mental status possible sepsis EXAM: PORTABLE CHEST 1 VIEW COMPARISON:  None Available. FINDINGS: The heart size and mediastinal contours are within normal limits. Both lungs are clear. The visualized skeletal structures are unremarkable. Clips at the right thoracic inlet. IMPRESSION: No active disease. Electronically Signed   By: Donavan Foil M.D.   On: 07/19/2022 17:33      Signature  Lala Lund M.D on 07/23/2022 at 12:59 PM   -  To page go to [email or web address]

## 2022-07-23 NOTE — Progress Notes (Signed)
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 11/2 CSF cx NG ( Final).   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 Endocrine at [facility] was consulted and s/p cosyntropin stimulation test. On hydrocortisone as well as synthroid.   2. Hyponatremia likely 2/2 central DI 2/2 above - Nephrology consulted   3.  H/o thyroidectomy post Grave's disease - on synthroid    Recommendations Chart reviewed including notes from all consultants. Patient is almost back to her baseline except some mild headache and dizziness. CSF cx NG ( final). Most likely clinical presentation is consistent with chemical meningitis. Hence, will DC ceftriaxone  Monitor off abtx for 24 hrs Management of panhypopituitarism per Primary with Endocrinology input  ID will sign off. Please call with questions.   Rest of the management as per the primary team. Thank you for the consult. Please page with pertinent questions or concerns.  ______________________________________________________________________ Subjective patient seen and examined at the bedside. Spoke with patient with the help of video [place] interpreter. She feels back to her baseline except some headache and dizziness. Vision is ok. Denies nausea, vomiting, abdominal pain or neck pain.   Vitals BP (!) 115/93 (BP Location: Right Arm)   Pulse  (!) 107   Temp 97.9 F (36.6 C) (Oral)   Resp 15   Ht '5\' 6"'$  (1.676 m)   Wt 76.7 kg   LMP 04/05/2021 (Approximate) Comment: regular  SpO2 97%   BMI 27.29 kg/m     Physical Exam Constitutional:  sitting in the recliner and appears comfortable     Comments:   Cardiovascular:     Rate and Rhythm: Normal rate and regular rhythm.     Heart sounds:  Pulmonary:     Effort: Pulmonary effort is normal on room air     Comments:   Abdominal:     Palpations: Abdomen is soft.     Tenderness: non distended and non tender   Musculoskeletal:        General: No swelling or tenderness in peripheral joints. No pedal edema   Skin:    Comments: No obvious rashes   Neurological:     General: grossly non focal, awake, alert and oriented   Psychiatric:        Mood and Affect: Mood normal.   Pertinent Microbiology Results for orders placed or performed during the hospital encounter of 07/19/22  Resp Panel by RT-PCR (Flu A&B, Covid) Anterior Nasal Swab     Status: None   Collection Time: 07/19/22  4:12 PM   Specimen: Anterior Nasal Swab  Result Value Ref Range Status   SARS Coronavirus 2 by RT PCR NEGATIVE NEGATIVE Final    Comment: (NOTE) SARS-CoV-2 target nucleic acids are NOT DETECTED.  The SARS-CoV-2 RNA is generally detectable in upper respiratory specimens during the acute phase of infection. The lowest concentration of SARS-CoV-2 viral copies this assay can detect is 138 copies/mL. A negative result does not preclude SARS-Cov-2 infection and should not be used as the sole basis for treatment or other patient  management decisions. A negative result may occur with  improper specimen collection/handling, submission of specimen other than nasopharyngeal swab, presence of viral mutation(s) within the areas targeted by this assay, and inadequate number of viral copies(<138 copies/mL). A negative result must be combined with clinical observations, patient history, and  epidemiological information. The expected result is Negative.  Fact Sheet for Patients:  EntrepreneurPulse.com.au  Fact Sheet for Healthcare Providers:  IncredibleEmployment.be  This test is no t yet approved or cleared by the [place] FDA and  has been authorized for detection and/or diagnosis of SARS-CoV-2 by FDA under an Emergency Use Authorization (EUA). This EUA will remain  in effect (meaning this test can be used) for the duration of the COVID-19 declaration under Section 564(b)(1) of the Act, 21 U.S.C.section 360bbb-3(b)(1), unless the authorization is terminated  or revoked sooner.       Influenza A by PCR NEGATIVE NEGATIVE Final   Influenza B by PCR NEGATIVE NEGATIVE Final    Comment: (NOTE) The Xpert Xpress SARS-CoV-2/FLU/RSV plus assay is intended as an aid in the diagnosis of influenza from Nasopharyngeal swab specimens and should not be used as a sole basis for treatment. Nasal washings and aspirates are unacceptable for Xpert Xpress SARS-CoV-2/FLU/RSV testing.  Fact Sheet for Patients: EntrepreneurPulse.com.au  Fact Sheet for Healthcare Providers: IncredibleEmployment.be  This test is not yet approved or cleared by the [place] FDA and has been authorized for detection and/or diagnosis of SARS-CoV-2 by FDA under an Emergency Use Authorization (EUA). This EUA will remain in effect (meaning this test can be used) for the duration of the COVID-19 declaration under Section 564(b)(1) of the Act, 21 U.S.C. section 360bbb-3(b)(1), unless the authorization is terminated or revoked.  Performed at [facility], [place] [address], [place], [place] [address]   Blood Culture (routine x 2)     Status: None (Preliminary result)   Collection Time: 07/19/22  4:38 PM   Specimen: BLOOD LEFT HAND  Result Value Ref Range Status   Specimen Description BLOOD LEFT HAND  Final   Special Requests    Final    BOTTLES DRAWN AEROBIC AND ANAEROBIC Blood Culture results may not be optimal due to an inadequate volume of blood received in culture bottles   Culture   Final    NO GROWTH 4 DAYS Performed at [facility], [place] [address] [place], [place] [address]    Report Status PENDING  Incomplete  Blood Culture (routine x 2)     Status: None (Preliminary result)   Collection Time: 07/19/22  4:40 PM   Specimen: BLOOD  Result Value Ref Range Status   Specimen Description BLOOD BLOOD RIGHT FOREARM  Final   Special Requests   Final    BOTTLES DRAWN AEROBIC AND ANAEROBIC Blood Culture adequate volume   Culture   Final    NO GROWTH 4 DAYS Performed at [facility], [place] [address], [place], [place] [address]    Report Status PENDING  Incomplete  Urine Culture     Status: None   Collection Time: 07/19/22  5:06 PM   Specimen: In/Out Cath Urine  Result Value Ref Range Status   Specimen Description IN/OUT CATH URINE  Final   Special Requests NONE  Final   Culture   Final    NO GROWTH Performed at [facility], [place] [address] [place], [place] [address]    Report Status 07/20/2022 FINAL  Final  CSF culture  Status: None   Collection Time: 07/19/22  6:59 PM   Specimen: CSF; Cerebrospinal Fluid  Result Value Ref Range Status   Specimen Description CSF  Final   Special Requests NONE  Final   Gram Stain   Final    WBC PRESENT, PREDOMINANTLY PMN NO ORGANISMS SEEN CYTOSPIN SMEAR    Culture   Final    NO GROWTH 3 DAYS Performed at [facility], [place] [address] [place], [place] [address]    Report Status 07/23/2022 FINAL  Final  MRSA Next Gen by PCR, Nasal     Status: None   Collection Time: 07/21/22  6:35 AM  Result Value Ref Range Status   MRSA by PCR Next Gen NOT DETECTED NOT DETECTED Final    Comment: (NOTE) The GeneXpert MRSA Assay (FDA approved for NASAL specimens only), is one component of a comprehensive MRSA colonization surveillance program. It is  not intended to diagnose MRSA infection nor to guide or monitor treatment for MRSA infections. Test performance is not FDA approved in patients less than 55 years old. Performed at [facility], [place] [address] [place], [place] [address]     Pertinent Lab.    Latest Ref Rng & Units 07/23/2022    2:54 AM 07/22/2022    7:13 AM 07/21/2022   12:11 AM  CBC  WBC 4.0 - 10.5 K/uL 6.2  11.3  14.5   Hemoglobin 12.0 - 15.0 g/dL 10.4  9.2  10.6   Hematocrit 36.0 - 46.0 % 30.7  28.4  30.8   Platelets 150 - 400 K/uL 144  124  141       Latest Ref Rng & Units 07/23/2022    2:54 AM 07/22/2022    5:44 PM 07/22/2022    1:33 PM  CMP  Glucose 70 - 99 mg/dL 93     BUN 6 - 20 mg/dL 10     Creatinine 0.44 - 1.00 mg/dL 0.48     Sodium 135 - 145 mmol/L 131  131  132   Potassium 3.5 - 5.1 mmol/L 3.8     Chloride 98 - 111 mmol/L 101     CO2 22 - 32 mmol/L 19     Calcium 8.9 - 10.3 mg/dL 8.6        Pertinent Imaging today Plain films and CT images have been personally visualized and interpreted; radiology reports have been reviewed. Decision making incorporated into the Impression / Recommendations.  MR BRAIN W WO CONTRAST  Result Date: 07/20/2022 CLINICAL DATA:  Provided history: Mental status change unknown cause. EXAM: MRI HEAD WITHOUT AND WITH CONTRAST TECHNIQUE: Multiplanar, multiecho pulse sequences of the brain and surrounding structures were obtained without and with intravenous contrast. CONTRAST:  7.24m GADAVIST GADOBUTROL 1 MMOL/ML IV SOLN COMPARISON:  Prior head CT examinations 07/19/2022 and earlier. FINDINGS: Brain: Cerebral volume is normal. There is a centrally cystic, peripherally enhancing lesion occupying the sella and extending into the suprasellar cistern, measuring 1.1 x 1.8 x 1.9 cm. There are apparent chronic blood products along the periphery of the lesion. The pituitary gland is poorly delineated separately from the lesion. The pituitary stalk is displaced superiorly and  posteriorly (for instance as seen on series 15, image 8). The lesion encroaches upon the optic apparatus (for instance as seen on series 13, image 9). No evidence of cavernous sinus extension. No cortical encephalomalacia is identified. No significant cerebral white matter disease. There is no acute infarct. No extra-axial fluid collection. No midline  shift. Vascular: Maintained flow voids within the proximal large arterial vessels. Skull and upper cervical spine: No focal suspicious marrow lesion. Sinuses/Orbits: Mild mucosal thickening within the bilateral ethmoid, sphenoid and maxillary sinuses. 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 There are apparent chronic blood products along the periphery of this lesion. Favored differential considerations include a cystic/necrotic pituitary macroadenoma, sequela of pituitary infarct/hemorrhage (as can be seen in the setting of pituitary apoplexy) or large Rathke's cyst (with prior hemorrhage). Craniopharyngioma is considered less likely as these lesions are not typically cystic in adult patients. The lesion encroaches upon the optic apparatus. No evidence of cavernous sinus extension. Otherwise unremarkable MRI appearance of the brain. Mild mucosal thickening within the paranasal sinuses. Electronically Signed   By: Kellie Simmering D.O.   On: 07/20/2022 16:21   CT Head Wo Contrast  Result Date: 07/19/2022 CLINICAL DATA:  Altered mental status. EXAM: CT HEAD WITHOUT CONTRAST TECHNIQUE: Contiguous axial images were obtained from the base of the skull through the vertex without intravenous contrast. RADIATION DOSE REDUCTION: This exam was performed according to the departmental dose-optimization program which includes automated exposure control, adjustment of the mA and/or kV according to patient size and/or use of iterative reconstruction  technique. COMPARISON:  Head CT dated 05/02/2022. FINDINGS: Brain: The ventricles and sulci are appropriate size for the patient's age. The gray-white matter discrimination is preserved. There is no acute intracranial hemorrhage. There is an enlarging sellar/suprasellar mass measuring 1.9 x 1.4 cm in greatest axial dimensions and approximately 2 cm in craniocaudal length. Further evaluation with pituitary mass protocol MRI recommended. No mass effect or midline shift. No extra-axial fluid collection. Vascular: No hyperdense vessel or unexpected calcification. Skull: Normal. Negative for fracture or focal lesion. Sinuses/Orbits: Mild mucoperiosteal thickening of paranasal sinuses. No air-fluid level. The mastoid air cells are clear. Other: None IMPRESSION: 1. No acute intracranial hemorrhage. 2. Enlarging sellar/suprasellar mass. Further evaluation with pituitary mass protocol MRI recommended. Electronically Signed   By: Anner Crete M.D.   On: 07/19/2022 19:07   DG Chest Port 1 View  Result Date: 07/19/2022 CLINICAL DATA:  Altered mental status possible sepsis EXAM: PORTABLE CHEST 1 VIEW COMPARISON:  None Available. FINDINGS: The heart size and mediastinal contours are within normal limits. Both lungs are clear. The visualized skeletal structures are unremarkable. Clips at the right thoracic inlet. IMPRESSION: No active disease. Electronically Signed   By: Donavan Foil M.D.   On: 07/19/2022 17:33     I spent more 60  minutes for this patient encounter including review of prior medical records, coordination of care with primary/other specialist with greater than 50% of time being face to face/counseling and discussing diagnostics/treatment plan with the patient/family.  Electronically signed by:   Rosiland Oz, MD Infectious Disease Physician [facility] for Infectious Disease Pager: [phone]

## 2022-07-24 ENCOUNTER — Other Ambulatory Visit (HOSPITAL_COMMUNITY): Payer: Self-pay

## 2022-07-24 LAB — CULTURE, BLOOD (ROUTINE X 2)
Culture: NO GROWTH
Culture: NO GROWTH
Special Requests: ADEQUATE

## 2022-07-24 LAB — CBC WITH DIFFERENTIAL/PLATELET
Abs Immature Granulocytes: 0.02 10*3/uL (ref 0.00–0.07)
Basophils Absolute: 0 10*3/uL (ref 0.0–0.1)
Basophils Relative: 0 %
Eosinophils Absolute: 0 10*3/uL (ref 0.0–0.5)
Eosinophils Relative: 1 %
HCT: 33.4 % — ABNORMAL LOW (ref 36.0–46.0)
Hemoglobin: 11.2 g/dL — ABNORMAL LOW (ref 12.0–15.0)
Immature Granulocytes: 0 %
Lymphocytes Relative: 45 %
Lymphs Abs: 2.2 10*3/uL (ref 0.7–4.0)
MCH: 30.3 pg (ref 26.0–34.0)
MCHC: 33.5 g/dL (ref 30.0–36.0)
MCV: 90.3 fL (ref 80.0–100.0)
Monocytes Absolute: 0.4 10*3/uL (ref 0.1–1.0)
Monocytes Relative: 8 %
Neutro Abs: 2.2 10*3/uL (ref 1.7–7.7)
Neutrophils Relative %: 46 %
Platelets: 170 10*3/uL (ref 150–400)
RBC: 3.7 MIL/uL — ABNORMAL LOW (ref 3.87–5.11)
RDW: 11.9 % (ref 11.5–15.5)
WBC: 4.9 10*3/uL (ref 4.0–10.5)
nRBC: 0 % (ref 0.0–0.2)

## 2022-07-24 LAB — COMPREHENSIVE METABOLIC PANEL
ALT: 23 U/L (ref 0–44)
AST: 21 U/L (ref 15–41)
Albumin: 2.8 g/dL — ABNORMAL LOW (ref 3.5–5.0)
Alkaline Phosphatase: 57 U/L (ref 38–126)
Anion gap: 8 (ref 5–15)
BUN: 13 mg/dL (ref 6–20)
CO2: 21 mmol/L — ABNORMAL LOW (ref 22–32)
Calcium: 8.6 mg/dL — ABNORMAL LOW (ref 8.9–10.3)
Chloride: 109 mmol/L (ref 98–111)
Creatinine, Ser: 0.64 mg/dL (ref 0.44–1.00)
GFR, Estimated: >60 mL/min (ref >60)
Glucose, Bld: 89 mg/dL (ref 70–99)
Potassium: 3.7 mmol/L (ref 3.5–5.1)
Sodium: 138 mmol/L (ref 135–145)
Total Bilirubin: 0.5 mg/dL (ref 0.3–1.2)
Total Protein: 5.8 g/dL — ABNORMAL LOW (ref 6.5–8.1)

## 2022-07-24 LAB — LUTEINIZING HORMONE: LH: <0.3 m[IU]/mL

## 2022-07-24 LAB — ESTRADIOL: Estradiol: 9.5 pg/mL

## 2022-07-24 LAB — MAGNESIUM: Magnesium: 2 mg/dL (ref 1.7–2.4)

## 2022-07-24 LAB — ACTH: C206 ACTH: 2.9 pg/mL — ABNORMAL LOW (ref 7.2–63.3)

## 2022-07-24 LAB — INSULIN-LIKE GROWTH FACTOR: Somatomedin C: 53 ng/mL — ABNORMAL LOW (ref 79–259)

## 2022-07-24 LAB — BRAIN NATRIURETIC PEPTIDE: B Natriuretic Peptide: 20.6 pg/mL (ref 0.0–100.0)

## 2022-07-24 LAB — FOLLICLE STIMULATING HORMONE: FSH: 0.9 m[IU]/mL

## 2022-07-24 MED ORDER — HYDROCORTISONE 10 MG PO TABS
10.0000 mg | ORAL_TABLET | Freq: Two times a day (BID) | ORAL | 0 refills | Status: AC
  Filled 2022-07-24: qty 60, 30d supply, fill #0

## 2022-07-24 MED ORDER — DESMOPRESSIN ACETATE 0.1 MG PO TABS
0.0500 mg | ORAL_TABLET | Freq: Every day | ORAL | Status: DC
  Filled 2022-07-24: qty 1

## 2022-07-24 MED ORDER — DESMOPRESSIN ACETATE 0.1 MG PO TABS
0.0500 mg | ORAL_TABLET | Freq: Every day | ORAL | 0 refills | Status: AC
  Filled 2022-07-24: qty 30, 60d supply, fill #0

## 2022-07-24 NOTE — Discharge Instructions (Signed)
Follow with Primary MD [name], MD in 3 days a referral for endocrine and nephrology follow-up within a week of discharge.  Get CBC, CMP, Magnesium -  checked next visit with your primary MD    Activity: As tolerated with Full fall precautions use walker/cane & assistance as needed  Disposition Home    Diet: Heart Healthy    Special Instructions: If you have smoked or chewed Tobacco  in the last 2 yrs please stop smoking, stop any regular Alcohol  and or any Recreational drug use.  On your next visit with your primary care physician please Get Medicines reviewed and adjusted.  Please request your Prim.MD to go over all Hospital Tests and Procedure/Radiological results at the follow up, please get all Hospital records sent to your Prim MD by signing hospital release before you go home.  If you experience worsening of your admission symptoms, develop shortness of breath, life threatening emergency, suicidal or homicidal thoughts you must seek medical attention immediately by calling 911 or calling your MD immediately  if symptoms less severe.  You Must read complete instructions/literature along with all the possible adverse reactions/side effects for all the Medicines you take and that have been prescribed to you. Take any new Medicines after you have completely understood and accpet all the possible adverse reactions/side effects.

## 2022-07-24 NOTE — Progress Notes (Addendum)
Physical Therapy Treatment Patient Details Name: Sara Mathis MRN: [id number] DOB: Mar 22, 1986 Today's Date: 07/24/2022   History of Present Illness 36 y.o. F admitted 11/2 who presented to the ED with acute altered mental status.  Per her roommate, she was in her normal state of health 11/1, then stopped talking and following commands the next day.  She has had increased migraine headaches for the last month.  She was initially febrile to 101F, tachycardic and agitated.  Initial labs showed lactic acid of 3.1, no leukocytosis, TSH 0.065, normal ethanol, UDS pending.  UA and CXR without clear source of infection.  She was given 3L IVF, broad spectrum abx. 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  PMH: migraines and hypothyroidism, thyroidectomy    PT Comments    Pt admitted with above diagnosis. Pt was able to ambulate with independence with balance continuing to improve. Brought Spanish handout for x1 exercises to pt and reviewed exercises with pt.  Also discussed compensation techniques with pt and family as well. Pt going home today and family in room to take her home.  Pt declines rollator and is aware of PT recommendation which would give her ultimate safety. Pt plans to receive skilled HHPT for vestibular rehab at home.    Recommendations for follow up therapy are one component of a multi-disciplinary discharge planning process, led by the attending physician.  Recommendations may be updated based on patient status, additional functional criteria and insurance authorization.  Follow Up Recommendations  Home health PT (vestibular rehab)     Assistance Recommended at Discharge PRN  Patient can return home with the following A little help with walking and/or transfers;Help with stairs or ramp for entrance   Equipment Recommendations  Rollator (4 wheels) (Pt declining  rollator)    Recommendations for Other Services       Precautions / Restrictions Precautions Precautions: Fall Restrictions Weight Bearing Restrictions: No     Mobility  Bed Mobility Overal bed mobility: Independent                  Transfers Overall transfer level: Independent                 General transfer comment: Pt put her shoes on Prior to walking    Ambulation/Gait Ambulation/Gait assistance: Independent Gait Distance (Feet): 100 Feet Assistive device: None Gait Pattern/deviations: Step-through pattern, Decreased stride length   Gait velocity interpretation: 1.31 - 2.62 ft/sec, indicative of limited community ambulator   General Gait Details: Pt progressing ambulation in hallway with less LOB with challenges with pt self correcting.   Stairs             Wheelchair Mobility    Modified Rankin (Stroke Patients Only)       Balance Overall balance assessment: Needs assistance Sitting-balance support: No upper extremity supported, Feet supported Sitting balance-Leahy Scale: Good     Standing balance support: No upper extremity supported, During functional activity Standing balance-Leahy Scale: Fair Standing balance comment: stands statically without LOB but dynamically with incr challenges, needs Ue support for balance                            Cognition Arousal/Alertness: Awake/alert Behavior During Therapy: WFL for tasks assessed/performed Overall Cognitive Status: Within Functional Limits for tasks assessed  Exercises Other Exercises Other Exercises: Reviewed x1 exercises    General Comments General comments (skin integrity, edema, etc.): Gave Spanish exercise handout to pt regarding x1 exercises.      Pertinent Vitals/Pain Pain Assessment Pain Assessment: No/denies pain Breathing: normal Negative Vocalization: none Facial Expression: smiling or  inexpressive Body Language: relaxed Consolability: no need to console PAINAD Score: 0    Home Living                          Prior Function            PT Goals (current goals can now be found in the care plan section) Acute Rehab PT Goals Patient Stated Goal: to go home PT Goal Formulation: All assessment and education complete, DC therapy Progress towards PT goals: Goals met and updated - see care plan    Frequency    Min 3X/week      PT Plan Current plan remains appropriate    Co-evaluation              AM-PAC PT "6 Clicks" Mobility   Outcome Measure  Help needed turning from your back to your side while in a flat bed without using bedrails?: None Help needed moving from lying on your back to sitting on the side of a flat bed without using bedrails?: None Help needed moving to and from a bed to a chair (including a wheelchair)?: A Little Help needed standing up from a chair using your arms (e.g., wheelchair or bedside chair)?: A Little Help needed to walk in hospital room?: A Little Help needed climbing 3-5 steps with a railing? : A Lot 6 Click Score: 19    End of Session Equipment Utilized During Treatment: Gait belt Activity Tolerance: Patient tolerated treatment well Patient left: in chair;with call bell/phone within reach;with family/visitor present Nurse Communication: Mobility status PT Visit Diagnosis: Dizziness and giddiness (R42);Unsteadiness on feet (R26.81)     Time: 1624-4695 PT Time Calculation (min) (ACUTE ONLY): 8 min  Charges:  $Self Care/Home Management: 8-22                     [facility] M,PT Acute Rehab Services [place] 07/24/2022, 11:15 AM

## 2022-07-24 NOTE — Discharge Summary (Signed)
Arine Foley Tazewell Ramos SVX:[id number] DOB: 11-May-1986 DOA: 07/19/2022  PCP: [name], MD  Admit date: 07/19/2022  Discharge date: 07/24/2022  Admitted From: Home   Disposition:  Home   Recommendations for Outpatient Follow-up:   Follow up with PCP in 1-2 weeks  PCP Please obtain BMP/CBC, 2 view CXR in 1week,  (see Discharge instructions)   PCP Please follow up on the following pending results: Needs urgent endocrine and nephrology follow-up within a week of discharge.  Follow BMP magnesium and CBC closely.   Home Health: None   Equipment/Devices: None  Consultations:PCCM, Renal, ID Discharge Condition: Stable    CODE STATUS: Full    Diet Recommendation: Heart Healthy     Chief Complaint  Patient presents with   Altered Mental Status     Brief history of present illness from the day of admission and additional interim summary    36 y.o. F with PMH significant for migraines and hypothyroidism who presented to the ED with acute altered mental status.  Per her roommate, she was in her normal state of health 11/1, then stopped talking and following commands the next day.  She has had increased migraine headaches for the last month.  She was initially febrile to 101F, tachycardic and agitated.  Initial labs showed lactic acid of 3.1, no leukocytosis, TSH 0.065, normal ethanol, UDS pending.  UA and CXR without clear source of infection.  She was given 3L IVF, broad spectrum abx and head CT/LP pending.  Her agitation required precedex infusion, so PCCM consulted for admission, she was kept in ICU stabilized transferred to hospitalist service on 07/23/2022 on day 4 of her hospital stay.                                                                  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  Seen by neurosurgery, PCCM and ID.  Clinically meningitis has been ruled out, defer stoppage of antibiotics to ID, currently has undergone cosyntropin stimulation test which was borderline with initial cortisol around 1 in the 90-minute cortisol being 19, she has been placed on hydrocortisone twice a day along with home Synthroid by PCCM team after discussions with endocrine at [facility].  Also seen by nephrology on desmopressin for suspected DI, stable and symptom-free right now.  Discharged home with close outpatient follow-up to be arranged by PCP with endocrine and nephrology.  Case discussed with nephrologist Dr. Augustin Coupe today.   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  Renal consulted, placed on desmopressin.  PCP to monitor BMP and   History of thyroidectomy post Graves' disease.  Has postop hypothyroidism for which home Synthroid continue TSH was stable.   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  Get CBC, CMP, Magnesium -  checked next visit with your primary MD    Activity: As tolerated with Full fall precautions use walker/cane & assistance as needed  Disposition Home    Diet: Heart Healthy    Special Instructions: If you have smoked or chewed Tobacco  in the last 2 yrs please stop smoking, stop any regular Alcohol  and or any Recreational drug use.  On your next visit with your primary care  physician please Get Medicines reviewed and adjusted.  Please request your Prim.MD to go over all Hospital Tests and Procedure/Radiological results at the follow up, please get all Hospital records sent to your Prim MD by signing hospital release before you go home.  If you experience worsening of your admission symptoms, develop shortness of breath, life threatening emergency, suicidal or homicidal thoughts you must seek medical attention immediately by calling 911 or calling your MD immediately  if symptoms less severe.  You Must read complete instructions/literature along with all the possible adverse reactions/side effects for all the Medicines you take and that have been prescribed to you. Take any new Medicines after you have completely understood and accpet all the possible adverse reactions/side effects.   Increase activity slowly   Complete by: As directed        Discharge Medications   Allergies as of 07/24/2022   No Known Allergies      Medication List     STOP taking these medications    cetirizine 10 MG tablet Commonly known as: ZYRTEC   ondansetron 4 MG tablet Commonly known as: ZOFRAN       TAKE these medications    calcium citrate-vitamin D 500-400 MG-UNIT chewable tablet 1 tab by mouth twice daily. What changed:  how much to take how to take this when to take this   cholecalciferol 25 MCG (1000 UNIT) tablet Commonly known as: VITAMIN D3 Take 1,000 Units by mouth daily.   cyclobenzaprine 5 MG tablet Commonly known as: FLEXERIL Take 5 mg by mouth at bedtime as needed for muscle spasms.   desmopressin 0.1 MG tablet Commonly known as: DDAVP Take 0.5 tablets (0.05 mg total) by mouth at bedtime.   hydrocortisone 10 MG tablet Commonly known as: CORTEF Take 1 tablet (10 mg total) by mouth 2 (two) times daily.   levothyroxine 125 MCG tablet Commonly known as: SYNTHROID TAKE 1 TABLET BY MOUTH EVERY DAY BEFORE BREAKFAST What changed:  how much to  take how to take this when to take this additional instructions   Magnesium 200 MG Tabs Take 1 tablet by mouth daily.   Potassium 99 MG Tabs Take 1 tablet by mouth daily.   topiramate 25 MG tablet Commonly known as: TOPAMAX 1 tab by mouth at bedtime What changed:  how much to take how to take this when to take this additional instructions   vitamin C 250 MG tablet Commonly known as: ASCORBIC ACID Take 250 mg by mouth daily.  Follow-up Information     [name], MD. Schedule an appointment as soon as possible for a visit in 2 day(s).   Specialty: Internal Medicine Why: get an Urgent Endocrine and nephrology referral Contact information: [address] [place] [address] [phone]         Delrae Rend, MD. Schedule an appointment as soon as possible for a visit in 1 week(s).   Specialty: Endocrinology Contact information: [address] [place] [address] [phone]         Dwana Melena, MD. Schedule an appointment as soon as possible for a visit in 1 week(s).   Specialty: Nephrology Contact information: [place] [phone]                 Major procedures and Radiology Reports - PLEASE review detailed and final reports thoroughly  -     MR BRAIN W WO CONTRAST  Result Date: 07/20/2022 CLINICAL DATA:  Provided history: Mental status change unknown cause. EXAM: MRI HEAD WITHOUT AND WITH CONTRAST TECHNIQUE: Multiplanar, multiecho pulse sequences of the brain and surrounding structures were obtained without and with intravenous contrast. CONTRAST:  7.14m GADAVIST GADOBUTROL 1 MMOL/ML IV SOLN COMPARISON:  Prior head CT examinations 07/19/2022 and earlier. FINDINGS: Brain: Cerebral volume is normal. There is a centrally cystic, peripherally enhancing lesion occupying the sella and extending into the suprasellar cistern, measuring 1.1 x 1.8 x 1.9 cm. There are apparent chronic  blood products along the periphery of the lesion. The pituitary gland is poorly delineated separately from the lesion. The pituitary stalk is displaced superiorly and posteriorly (for instance as seen on series 15, image 8). The lesion encroaches upon the optic apparatus (for instance as seen on series 13, image 9). No evidence of cavernous sinus extension. No cortical encephalomalacia is identified. No significant cerebral white matter disease. There is no acute infarct. No extra-axial fluid collection. No midline shift. Vascular: Maintained flow voids within the proximal large arterial vessels. Skull and upper cervical spine: No focal suspicious marrow lesion. Sinuses/Orbits: Mild mucosal thickening within the bilateral ethmoid, sphenoid and maxillary sinuses. 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 There are apparent chronic blood products along the periphery of this lesion. Favored differential considerations include a cystic/necrotic pituitary macroadenoma, sequela of pituitary infarct/hemorrhage (as can be seen in the setting of pituitary apoplexy) or large Rathke's cyst (with prior hemorrhage). Craniopharyngioma is considered less likely as these lesions are not typically cystic in adult patients. The lesion encroaches upon the optic apparatus. No evidence of cavernous sinus extension. Otherwise unremarkable MRI appearance of the brain. Mild mucosal thickening within the paranasal sinuses. Electronically Signed   By: KKellie SimmeringD.O.   On: 07/20/2022 16:21   CT Head Wo Contrast  Result Date: 07/19/2022 CLINICAL DATA:  Altered mental status. EXAM: CT HEAD WITHOUT CONTRAST TECHNIQUE: Contiguous axial images were obtained from the base of the skull through the vertex without intravenous contrast. RADIATION DOSE REDUCTION: This exam was performed according to the departmental  dose-optimization program which includes automated exposure control, adjustment of the mA and/or kV according to patient size and/or use of iterative reconstruction technique. COMPARISON:  Head CT dated 05/02/2022. FINDINGS: Brain: The ventricles and sulci are appropriate size for the patient's age. The gray-white matter discrimination is preserved. There is no acute intracranial hemorrhage. There is an enlarging sellar/suprasellar mass  measuring 1.9 x 1.4 cm in greatest axial dimensions and approximately 2 cm in craniocaudal length. Further evaluation with pituitary mass protocol MRI recommended. No mass effect or midline shift. No extra-axial fluid collection. Vascular: No hyperdense vessel or unexpected calcification. Skull: Normal. Negative for fracture or focal lesion. Sinuses/Orbits: Mild mucoperiosteal thickening of paranasal sinuses. No air-fluid level. The mastoid air cells are clear. Other: None IMPRESSION: 1. No acute intracranial hemorrhage. 2. Enlarging sellar/suprasellar mass. Further evaluation with pituitary mass protocol MRI recommended. Electronically Signed   By: Anner Crete M.D.   On: 07/19/2022 19:07   DG Chest Port 1 View  Result Date: 07/19/2022 CLINICAL DATA:  Altered mental status possible sepsis EXAM: PORTABLE CHEST 1 VIEW COMPARISON:  None Available. FINDINGS: The heart size and mediastinal contours are within normal limits. Both lungs are clear. The visualized skeletal structures are unremarkable. Clips at the right thoracic inlet. IMPRESSION: No active disease. Electronically Signed   By: Donavan Foil M.D.   On: 07/19/2022 17:33      Today   Subjective    Nohemi Harrie Jeans today has no headache,no chest abdominal pain,no new weakness tingling or numbness, feels much better wants to go home today.    Objective   Blood pressure 122/87, pulse 84, temperature 98.3 F (36.8 C), temperature source Oral, resp. rate 16, height '5\' 6"'$  (1.676 m), weight 76.7 kg,  last menstrual period 04/05/2021, SpO2 98 %, unknown if currently breastfeeding.   Intake/Output Summary (Last 24 hours) at 07/24/2022 0952 Last data filed at 07/24/2022 0600 Gross per 24 hour  Intake 180 ml  Output --  Net 180 ml    Exam  Awake Alert, No new F.N deficits,    [place].AT,PERRAL Supple Neck,   Symmetrical Chest wall movement, Good air movement bilaterally, CTAB RRR,No Gallops,   +ve B.Sounds, Abd Soft, Non tender,  No Cyanosis, Clubbing or edema    Data Review   Recent Labs  Lab 07/19/22 1640 07/20/22 0330 07/21/22 0011 07/22/22 0713 07/23/22 0254 07/24/22 0425  WBC 8.1 3.8* 14.5* 11.3* 6.2 4.9  HGB 14.9 11.7* 10.6* 9.2* 10.4* 11.2*  HCT 44.7 34.5* 30.8* 28.4* 30.7* 33.4*  PLT 198 134* 141* 124* 144* 170  MCV 90.3 89.8 90.1 92.8 90.0 90.3  MCH 30.1 30.5 31.0 30.1 30.5 30.3  MCHC 33.3 33.9 34.4 32.4 33.9 33.5  RDW 11.6 11.6 11.9 11.9 11.4* 11.9  LYMPHSABS 2.2  --   --   --   --  2.2  MONOABS 0.7  --   --   --   --  0.4  EOSABS 0.0  --   --   --   --  0.0  BASOSABS 0.0  --   --   --   --  0.0    Recent Labs  Lab 07/19/22 1640 07/19/22 1640 07/19/22 1916 07/19/22 2230 07/20/22 0016 07/20/22 0330 07/20/22 2024 07/21/22 0011 07/21/22 0546 07/21/22 0658 07/21/22 1213 07/21/22 2038 07/22/22 0103 07/22/22 0713 07/22/22 1333 07/22/22 1744 07/23/22 0254 07/24/22 0425  NA 135  --   --   --   --  149* 151* 147*   < > 151*   < >  --    < > 134* 132* 131* 131* 138  K 4.5  --   --   --   --  4.3 2.8* 2.9*  --  3.4*  --   --   --  2.9*  --   --  3.8 3.7  CL  101  --   --   --   --  122* 118* 119*  --  118*  --   --   --  103  --   --  101 109  CO2 16*  --   --   --   --  '24 23 23  '$ --  23  --   --   --  22  --   --  19* 21*  GLUCOSE 94  --   --   --   --  195* 195* 200*  --  118*  --   --   --  95  --   --  93 89  BUN 12  --   --   --   --  '11 10 7  '$ --  7  --   --   --  7  --   --  10 13  CREATININE 0.94  --   --   --   --  0.73 0.68 0.72  --  0.66  --    --   --  0.48  --   --  0.48 0.64  CALCIUM 9.4  --   --   --   --  8.9 8.6* 8.6*  --  9.0  --   --   --  8.2*  --   --  8.6* 8.6*  AST 29  --   --   --   --   --   --   --   --   --   --   --   --   --   --   --   --  21  ALT 21  --   --   --   --   --   --   --   --   --   --   --   --   --   --   --   --  23  ALKPHOS 48  --   --   --   --   --   --   --   --   --   --   --   --   --   --   --   --  57  BILITOT 2.0*  --   --   --   --   --   --   --   --   --   --   --   --   --   --   --   --  0.5  ALBUMIN 3.3*  --   --   --   --   --   --   --   --   --   --   --   --   --   --   --   --  2.8*  MG  --   --   --   --   --  1.9  --  1.9  --   --   --   --   --  1.4*  --   --  2.1 2.0  PHOS  --    < >  --   --   --  1.6* 3.1 1.4*  --   --   --  4.1  --  3.4  --   --  3.6  --   LATICACIDVEN 3.1*  --  5.0* 2.0* 1.1  --   --   --   --   --   --   --   --   --   --   --   --   --  INR 1.2  --   --   --   --   --   --   --   --   --   --   --   --   --   --   --   --   --   TSH 0.065*  --   --   --   --   --   --   --   --   --   --   --   --   --   --   --   --   --   HGBA1C  --   --   --   --  4.2*  --   --   --   --   --   --   --   --   --   --   --   --   --   BNP  --   --   --   --   --   --   --   --   --   --   --   --   --   --   --   --   --  20.6   < > = values in this interval not displayed.    Total Time in preparing paper work, data evaluation and todays exam - 45 minutes  Lala Lund M.D on 07/24/2022 at 9:52 AM  [facility]

## 2022-07-24 NOTE — Progress Notes (Signed)
[facility] Progress Note   36 y.o. female migraines, hypothyroidism presenting w/ AMS but then stopped talking and following commands on 11/2. She reportedly has had worsening migraines over the past mth, was noted to be febrile with a temp of 101, tachycardic and agitated. 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 CT head shows an enlarging sellar/suprasellar mass, MRI showed a 1.1 x 1.8 x 1.9 cm centrally cystic and peripherally enhancing lesion occupying the sella turcica and extending into the suprasellar cistern.   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 May be more difficult to titrate with intranasal DDAVP because of the lowest concentration available but usually pts require 67mg BID.  - Will restart  DDAVP oral at 0.'05mg'$  today (should give at bedtime) and if d/c she should continue at 0.'05mg'$  w/ f/u with [facility] in 1 week to titrate up as needed. I also educated her on what to look for (ie nausea, vomiting, h/a, fatigue  which could represent hyponatremia and also excessive urine output in the AM which represents the need to uptitrate the DDAVP)  - Low solute diet will help as well; would not start a thiazide at this time. Can always start the thiazide outpt if needed to control the UOP. But I would not recommend starting the thiazide at this point.  Possible panhypopituitarism being worked up by primary. Hypothyroidism secondary to total thyroidectomy for Graves disease  Subjective:   She has minimal h/a, did not sleep well last night but denies diplopia, fever, chills. She's asking to go home. No issues ambulating or dizziness.   Objective:   BP 122/87 (BP Location: Left Arm)   Pulse 84   Temp  98.3 F (36.8 C) (Oral)   Resp 16   Ht '5\' 6"'$  (1.676 m)   Wt 76.7 kg   LMP 04/05/2021 (Approximate) Comment: regular  SpO2 98%   BMI 27.29 kg/m   Intake/Output Summary (Last 24 hours) at 07/24/2022 0732 Last data filed at 07/24/2022 0600 Gross per 24 hour  Intake 180 ml  Output 700 ml  Net -520 ml   Weight change:   Physical Exam: GEN: NAD, A&Ox3, NCAT, pleasant HEENT: No conjunctival pallor, EOMI NECK: Supple, no thyromegaly LUNGS: CTA B/L no rales, rhonchi or wheezing CV: RRR, No M/R/G ABD: SNDNT +BS  EXT: No lower extremity edema  Imaging: No results found.  Labs: BMET Recent Labs  Lab 07/20/22 0330 07/20/22 2024 07/21/22 0011 07/21/22 0546 07/21/22 0658 07/21/22 1213 07/21/22 1809 07/21/22 2038 07/22/22 0103 07/22/22 0713 07/22/22 1333 07/22/22 1744 07/23/22 0254 07/24/22 0425  NA 149* 151* 147*   < > 151*   < > 145  --  140 134* 132* 131* 131* 138  K 4.3 2.8* 2.9*  --  3.4*  --   --   --   --  2.9*  --   --  3.8 3.7  CL 122* 118* 119*  --  118*  --   --   --   --  103  --   --  101 109  CO2 '24 23 23  '$ --  23  --   --   --   --  22  --   --  19* 21*  GLUCOSE 195* 195* 200*  --  118*  --   --   --   --  95  --   --  93 89  BUN '11 10 7  '$ --  7  --   --   --   --  7  --   --  10 13  CREATININE 0.73 0.68 0.72  --  0.66  --   --   --   --  0.48  --   --  0.48 0.64  CALCIUM 8.9 8.6* 8.6*  --  9.0  --   --   --   --  8.2*  --   --  8.6* 8.6*  PHOS 1.6* 3.1 1.4*  --   --   --   --  4.1  --  3.4  --   --  3.6  --    < > = values in this interval not displayed.   CBC Recent Labs  Lab 07/19/22 1640 07/20/22 0330 07/21/22 0011 07/22/22 0713 07/23/22 0254 07/24/22 0425  WBC 8.1   < > 14.5* 11.3* 6.2 4.9  NEUTROABS 5.1  --   --   --   --  2.2  HGB 14.9   < > 10.6* 9.2* 10.4* 11.2*  HCT 44.7   < > 30.8* 28.4* 30.7* 33.4*  MCV 90.3   < > 90.1 92.8 90.0 90.3  PLT 198   < > 141* 124* 144* 170   < > = values in this interval not displayed.    Medications:      desmopressin  10 mcg Nasal QHS   hydrocortisone  15 mg Oral Daily   hydrocortisone  5 mg Oral Q24H   levothyroxine  125 mcg Oral Q0600   mouth rinse  15 mL Mouth Rinse 4 times per day      Otelia Santee, MD 07/24/2022, 7:32 AM

## 2022-07-24 NOTE — TOC Transition Note (Addendum)
Transition of Care [facility]) - CM/SW Discharge Note   Patient Details  Name: Sara Mathis MRN: [id number] Date of Birth: 04-Jan-1986  [facility] CM/SW Contact:  Carles Collet, RN Phone Number: 07/24/2022, 10:26 AM   Clinical Narrative:     Patient declined for charity HH. Referral for vestibular PT placed to [facility], closest location to her home address for vesitibular services. Patient declined [facility]. 2 meds getting filled by [facility] pharmacy, cost is $37, will preserve MATCH and bill patient  Final next level of care: Home/Self Care Barriers to Discharge: No Barriers Identified   Patient Goals and CMS Choice        Discharge Placement                       Discharge Plan and Services                                     Social Determinants of Health (SDOH) Interventions     Readmission Risk Interventions     No data to display

## 2022-07-24 NOTE — Plan of Care (Signed)

## 2022-07-25 ENCOUNTER — Ambulatory Visit: Payer: MEDICAID | Admitting: Physical Therapy

## 2022-07-25 LAB — UREA NITROGEN, URINE: Urea Nitrogen, Ur: 226 mg/dL

## 2022-07-25 NOTE — Therapy (Incomplete)
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  ONSET DATE: 07/19/2022  Rationale for Evaluation and Treatment: Rehabilitation  SUBJECTIVE:   SUBJECTIVE STATEMENT: *** Pt accompanied by: {accompnied:27141}  PERTINENT HISTORY: 36 y.o. F with PMH significant for migraines and hypothyroidism who presented to the ED with acute altered mental status.  Per her roommate, she was in her normal state of health 11/1, then stopped talking and following commands the next day.  She has had increased migraine headaches for the last month.  She was initially febrile to 101F, tachycardic and agitated.  Initial labs showed lactic acid of 3.1, no leukocytosis, TSH 0.065, normal ethanol, UDS pending.  UA and CXR without clear  source of infection.  She was given 3L IVF, broad spectrum abx and head CT/LP pending.  Her agitation required precedex infusion, so PCCM consulted for admission, she was kept in ICU stabilized transferred to hospitalist service on 07/23/2022 on day 4 of her hospital stay.   Pt was able to ambulate on unit withut device but did have occasional LOB of which pt able to self correct today unless challenged moderately. Pt is not at her baseline due to balance issues. Also incidental finding of right vestibular hypofunction and intiiated x 1 exercises to address and referred for vestibular PT.   PAIN:  Are you having pain? {OPRCPAIN:27236}  PRECAUTIONS: {Therapy precautions:24002}  WEIGHT BEARING RESTRICTIONS: {Yes ***/No:24003}  FALLS: Has patient fallen in last 6 months? {fallsyesno:27318}  LIVING ENVIRONMENT: Lives with: {OPRC lives with:25569::"lives with their family"} Lives in: {Lives in:25570} Stairs: {opstairs:27293} Has following equipment at home: {Assistive devices:23999}  PLOF: {PLOF:24004}  PATIENT GOALS: ***  OBJECTIVE:   DIAGNOSTIC FINDINGS: MR Brain 07/20/2022  IMPRESSION: 1.1 x 1.8 x 1.9 cm centrally cystic and peripherally enhancing lesion occupying the sella turcica and extending into the suprasellar cistern, as described. There are apparent chronic blood products along the periphery of this lesion. Favored differential considerations include a cystic/necrotic pituitary macroadenoma, sequela of pituitary infarct/hemorrhage (as can be seen in the setting of pituitary apoplexy) or large Rathke's cyst (with prior hemorrhage). Craniopharyngioma is considered less likely as these lesions are not typically cystic in adult patients. The lesion encroaches upon the optic apparatus. No evidence of cavernous sinus extension.   Otherwise unremarkable MRI appearance of the brain.  COGNITION: Overall cognitive status:  {cognition:24006}   SENSATION: {sensation:27233}  EDEMA:  {  edema:24020}  MUSCLE TONE:  {LE tone:25568}  DTRs:  {DTR SITE:24025}  POSTURE:  {posture:25561}  Cervical ROM:    {AROM/PROM:27142} A/PROM (deg) eval  Flexion   Extension   Right lateral flexion   Left lateral flexion   Right rotation   Left rotation   (Blank rows = not tested)  STRENGTH: ***  LOWER EXTREMITY MMT:   MMT Right eval Left eval  Hip flexion    Hip abduction    Hip adduction    Hip internal rotation    Hip external rotation    Knee flexion    Knee extension    Ankle dorsiflexion    Ankle plantarflexion    Ankle inversion    Ankle eversion    (Blank rows = not tested)  BED MOBILITY:  {Bed mobility:24027}  TRANSFERS: Assistive device utilized: {Assistive devices:23999}  Sit to stand: {Levels of assistance:24026} Stand to sit: {Levels of assistance:24026} Chair to chair: {Levels of assistance:24026} Floor: {Levels of assistance:24026}  RAMP: {Levels of assistance:24026}  CURB: {Levels of assistance:24026}  GAIT: Gait pattern: {gait characteristics:25376} Distance walked: *** Assistive device utilized: {Assistive devices:23999} Level of assistance: {Levels of assistance:24026} Comments: ***  FUNCTIONAL TESTS:  {Functional tests:24029}  PATIENT SURVEYS:  {rehab surveys:24030}  VESTIBULAR ASSESSMENT:  GENERAL OBSERVATION: ***   SYMPTOM BEHAVIOR:  Subjective history: ***  Non-Vestibular symptoms: {nonvestibular symptoms:25260}  Type of dizziness: {Type of Dizziness:25255}  Frequency: ***  Duration: ***  Aggravating factors: {Aggravating Factors:25258}  Relieving factors: {Relieving Factors:25259}  Progression of symptoms: {DESC; BETTER/WORSE:18575}  OCULOMOTOR EXAM:  Ocular Alignment: {Ocular Alignment:25262}  Ocular ROM: {RANGE OF MOTION:21649}  Spontaneous Nystagmus: {Spontaneous nystagmus:25263}  Gaze-Induced Nystagmus: {gaze-induced  nystagmus:25264}  Smooth Pursuits: {smooth pursuit:25265}  Saccades: {saccades:25266}  Convergence/Divergence: *** cm   VESTIBULAR - OCULAR REFLEX:   Slow VOR: {slow VOR:25290}  VOR Cancellation: {vor cancellation:25291}  Head-Impulse Test: {head impulse test:25272}  Dynamic Visual Acuity: {dynamic visual acuity:25273}   POSITIONAL TESTING: {Positional tests:25271}  MOTION SENSITIVITY:  Motion Sensitivity Quotient Intensity: 0 = none, 1 = Lightheaded, 2 = Mild, 3 = Moderate, 4 = Severe, 5 = Vomiting  Intensity  1. Sitting to supine   2. Supine to L side   3. Supine to R side   4. Supine to sitting   5. L Hallpike-Dix   6. Up from L    7. R Hallpike-Dix   8. Up from R    9. Sitting, head tipped to L knee   10. Head up from L knee   11. Sitting, head tipped to R knee   12. Head up from R knee   13. Sitting head turns x5   14.Sitting head nods x5   15. In stance, 180 turn to L    16. In stance, 180 turn to R     OTHOSTATICS: {Exam; orthostatics:31331}  FUNCTIONAL GAIT: {Functional tests:24029}   VESTIBULAR TREATMENT:                                                                                                   DATE: ***  Canalith Repositioning:  {Canalith Repositioning:25283}  Gaze Adaptation:  {gaze adaptation:25286} Habituation:  {habituation:25288} Other: ***  PATIENT EDUCATION: Education details: *** Person educated: {Person educated:25204} Education method: {Education Method:25205} Education comprehension: {Education Comprehension:25206}  HOME EXERCISE PROGRAM:  GOALS: Goals reviewed with patient? {yes/no:20286}  SHORT TERM GOALS: Target date: {follow up:25551}   *** Baseline: Goal status: {GOALSTATUS:25110}  2.  *** Baseline:  Goal status: {GOALSTATUS:25110}  3.  *** Baseline:  Goal status: {GOALSTATUS:25110}  4.  *** Baseline:  Goal status: {GOALSTATUS:25110}  5.  *** Baseline:  Goal status: {GOALSTATUS:25110}  6.   *** Baseline:  Goal status: {GOALSTATUS:25110}  LONG TERM GOALS: Target date: {follow up:25551}    *** Baseline:  Goal status: {GOALSTATUS:25110}  2.  *** Baseline:  Goal status: {GOALSTATUS:25110}  3.  *** Baseline:  Goal status: {GOALSTATUS:25110}  4.  *** Baseline:  Goal status: {GOALSTATUS:25110}  5.  *** Baseline:  Goal status: {GOALSTATUS:25110}  6.  *** Baseline:  Goal status: {GOALSTATUS:25110}  ASSESSMENT:  CLINICAL IMPRESSION: Patient is a *** y.o. *** who was seen today for physical therapy evaluation and treatment for ***.   OBJECTIVE IMPAIRMENTS: {opptimpairments:25111}.   ACTIVITY LIMITATIONS: {activitylimitations:27494}  PARTICIPATION LIMITATIONS: {participationrestrictions:25113}  PERSONAL FACTORS: {Personal factors:25162} are also affecting patient's functional outcome.   REHAB POTENTIAL: {rehabpotential:25112}  CLINICAL DECISION MAKING: {clinical decision making:25114}  EVALUATION COMPLEXITY: {Evaluation complexity:25115}   PLAN:  PT FREQUENCY: {rehab frequency:25116}  PT DURATION: {rehab duration:25117}  PLANNED INTERVENTIONS: {rehab planned interventions:25118::"Therapeutic exercises","Therapeutic activity","Neuromuscular re-education","Balance training","Gait training","Patient/Family education","Self Care","Joint mobilization"}  PLAN FOR NEXT SESSION: ***   Rennie Natter, PT 07/25/2022, 10:11 AM

## 2022-08-02 ENCOUNTER — Telehealth: Payer: Self-pay | Admitting: Internal Medicine

## 2022-08-02 NOTE — Telephone Encounter (Signed)
Pt called to report she went to the ER on 07/19/2022 and was told she had a mass in her brain. She also reported that she was seen by a neurologist and was told that her PCP needs to refer her to [facility].

## 2022-08-06 DIAGNOSIS — N912 Amenorrhea, unspecified: Secondary | ICD-10-CM | POA: Insufficient documentation

## 2022-08-07 NOTE — Telephone Encounter (Signed)
Tried to contact patient twice to get her scheduled. No answer and unable to leave a voice message.

## 2022-08-08 ENCOUNTER — Ambulatory Visit: Payer: MEDICAID | Admitting: Physical Therapy

## 2022-08-16 ENCOUNTER — Encounter: Payer: Self-pay | Admitting: Internal Medicine

## 2022-08-16 ENCOUNTER — Ambulatory Visit: Payer: Self-pay | Admitting: Internal Medicine

## 2022-08-16 VITALS — BP 102/74 | HR 92 | Resp 16 | Ht 66.0 in | Wt 161.0 lb

## 2022-08-16 DIAGNOSIS — K648 Other hemorrhoids: Secondary | ICD-10-CM

## 2022-08-16 DIAGNOSIS — R112 Nausea with vomiting, unspecified: Secondary | ICD-10-CM

## 2022-08-16 DIAGNOSIS — E232 Diabetes insipidus: Secondary | ICD-10-CM

## 2022-08-16 MED ORDER — HYDROCORTISONE (PERIANAL) 2.5 % EX CREA: 1.0000 | TOPICAL_CREAM | Freq: Two times a day (BID) | CUTANEOUS | 2 refills | Status: AC

## 2022-08-16 NOTE — Progress Notes (Signed)
    Subjective:    Patient ID: Sara Mathis, female   DOB: [birth year], 36 y.o.   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 Sharl Ma, Endocrinology and for some reason, Nephrology for [facility], which is central in etiology.    2.  Constipation recently and thinks she has developed hemorrhoids.  Yesterday, had diarrhea with her nausea and vomiting. No fever ye sterday.  She noted some vomiting.  Current Meds  Medication Sig   cholecalciferol (VITAMIN D3) 25 MCG (1000 UNIT) tablet Take 1,000 Units by mouth daily.   cyclobenzaprine (FLEXERIL) 5 MG tablet Take 5 mg by mouth at bedtime as needed for muscle spasms.   desmopressin (DDAVP) 0.1 MG tablet Take 0.5 tablets (0.05 mg total) by mouth at bedtime.   hydrocortisone (CORTEF) 10 MG tablet Take 1 tablet (10 mg total) by mouth 2 (two) times daily. (Patient taking differently: Take 10 mg by mouth 2 (two) times daily. 1.5 tab by mouth in the morning, then .5 tab by mouth in the evening)   levothyroxine (SYNTHROID) 125 MCG tablet TAKE 1 TABLET BY MOUTH EVERY DAY BEFORE BREAKFAST (Patient taking differently: Take 125 mcg by mouth daily before breakfast.)   Magnesium 200 MG TABS Take 1 tablet by mouth daily.   Potassium 99 MG TABS Take 1 tablet by mouth daily.   topiramate (TOPAMAX) 25 MG tablet 1 tab by mouth at bedtime (Patient taking differently: Take 25 mg by mouth at bedtime.)   vitamin C (ASCORBIC ACID) 250 MG tablet Take 250 mg by mouth daily.   No Known Allergies   Review of Systems    Objective:   BP 102/74 (BP Location: Left Arm, Patient Position: Sitting, Cuff Size: Normal)   Pulse 92   Resp 16   Ht 5\' 6"  (1.676 m)   Wt 161 lb (73 kg)   LMP 04/05/2021 (Approximate) Comment: regular  BMI 25.99 kg/m   Physical Exam Large internal hemorrhoids that were reduced internally.  Pink  color to them.  Assessment & Plan    Pituitary apoplexy with panhypopituitarism:  Call Dr. Daune Perch office and see if he feels she needs to continue with nephrology for DI or if he feels comfortable.   Addendum:  He feels he can take  care of this as not a kidney etiology.   Sees Dr. Sharl Ma on the 26th of December.   BMP  2.  Nausea and vomiting:  BMP  3.  Internal hemorrhoids:  Proctozone cream to affected area twice daily, particularly after BM.  Discussed Metamucil or other fiber in diet daily to prevent hard stools.  Witch Hazel pads to pat clean and dry after BM

## 2022-08-17 LAB — BASIC METABOLIC PANEL WITH GFR
BUN/Creatinine Ratio: 13 (ref 9–23)
BUN: 9 mg/dL (ref 6–20)
CO2: 23 mmol/L (ref 20–29)
Calcium: 9.8 mg/dL (ref 8.7–10.2)
Chloride: 100 mmol/L (ref 96–106)
Creatinine, Ser: 0.69 mg/dL (ref 0.57–1.00)
Glucose: 101 mg/dL — ABNORMAL HIGH (ref 70–99)
Potassium: 4.3 mmol/L (ref 3.5–5.2)
Sodium: 138 mmol/L (ref 134–144)
eGFR: 114 mL/min/{1.73_m2}

## 2022-08-24 ENCOUNTER — Emergency Department (HOSPITAL_COMMUNITY): Payer: Self-pay

## 2022-08-24 ENCOUNTER — Emergency Department (HOSPITAL_COMMUNITY)
Admission: EM | Admit: 2022-08-24 | Discharge: 2022-08-24 | Disposition: A | Discharge status: Home / Self Care | Payer: Self-pay | Attending: Emergency Medicine | Admitting: Emergency Medicine

## 2022-08-24 ENCOUNTER — Encounter (HOSPITAL_COMMUNITY): Payer: Self-pay

## 2022-08-24 ENCOUNTER — Other Ambulatory Visit: Payer: Self-pay

## 2022-08-24 DIAGNOSIS — R112 Nausea with vomiting, unspecified: Secondary | ICD-10-CM | POA: Insufficient documentation

## 2022-08-24 DIAGNOSIS — R519 Headache, unspecified: Secondary | ICD-10-CM | POA: Insufficient documentation

## 2022-08-24 LAB — BASIC METABOLIC PANEL
Anion gap: 11 (ref 5–15)
BUN: 9 mg/dL (ref 6–20)
CO2: 24 mmol/L (ref 22–32)
Calcium: 9.6 mg/dL (ref 8.9–10.3)
Chloride: 107 mmol/L (ref 98–111)
Creatinine, Ser: 0.83 mg/dL (ref 0.44–1.00)
GFR, Estimated: >60 mL/min (ref >60)
Glucose, Bld: 92 mg/dL (ref 70–99)
Potassium: 3.3 mmol/L — ABNORMAL LOW (ref 3.5–5.1)
Sodium: 142 mmol/L (ref 135–145)

## 2022-08-24 LAB — CBC
HCT: 39.7 % (ref 36.0–46.0)
Hemoglobin: 13.2 g/dL (ref 12.0–15.0)
MCH: 30.1 pg (ref 26.0–34.0)
MCHC: 33.2 g/dL (ref 30.0–36.0)
MCV: 90.4 fL (ref 80.0–100.0)
Platelets: 215 10*3/uL (ref 150–400)
RBC: 4.39 MIL/uL (ref 3.87–5.11)
RDW: 12.2 % (ref 11.5–15.5)
WBC: 3.6 10*3/uL — ABNORMAL LOW (ref 4.0–10.5)
nRBC: 0 % (ref 0.0–0.2)

## 2022-08-24 MED ORDER — PROCHLORPERAZINE EDISYLATE 10 MG/2ML IJ SOLN
10.0000 mg | Freq: Once | INTRAMUSCULAR | Status: AC
  Administered 2022-08-24: 10 mg via INTRAVENOUS
  Filled 2022-08-24: qty 2

## 2022-08-24 MED ORDER — SODIUM CHLORIDE 0.9 % IV BOLUS
1000.0000 mL | Freq: Once | INTRAVENOUS | Status: AC
  Administered 2022-08-24: 1000 mL via INTRAVENOUS

## 2022-08-24 MED ORDER — ONDANSETRON 4 MG PO TBDP
4.0000 mg | ORAL_TABLET | Freq: Once | ORAL | Status: AC
  Administered 2022-08-24: 4 mg via ORAL
  Filled 2022-08-24: qty 1

## 2022-08-24 MED ORDER — KETOROLAC TROMETHAMINE 15 MG/ML IJ SOLN
15.0000 mg | Freq: Once | INTRAMUSCULAR | Status: AC
  Administered 2022-08-24: 15 mg via INTRAVENOUS
  Filled 2022-08-24: qty 1

## 2022-08-24 MED ORDER — DIPHENHYDRAMINE HCL 50 MG/ML IJ SOLN
25.0000 mg | Freq: Once | INTRAMUSCULAR | Status: AC
  Administered 2022-08-24: 25 mg via INTRAVENOUS
  Filled 2022-08-24: qty 1

## 2022-08-24 MED ORDER — DEXAMETHASONE SODIUM PHOSPHATE 10 MG/ML IJ SOLN
10.0000 mg | Freq: Once | INTRAMUSCULAR | Status: AC
  Administered 2022-08-24: 10 mg via INTRAVENOUS
  Filled 2022-08-24: qty 1

## 2022-08-24 NOTE — ED Provider Notes (Signed)
[facility] EMERGENCY DEPARTMENT Provider Note   CSN: [id number] Arrival date & time: 08/24/22  1645     History Chief Complaint  Patient presents with   Headache   Emesis    Sara Mathis is a 36 y.o. female.   Headache Associated symptoms: fatigue, nausea and vomiting   Associated symptoms: no abdominal pain, no cough, no diarrhea and no fever   Emesis Associated symptoms: chills and headaches   Associated symptoms: no abdominal pain, no cough, no diarrhea and no fever    Patient presents to the ER for nausea, vomiting, and headaches. She reports that she was previous told that she has a pituitary tumor and should be seen in the ER if she experienced these recurrent symptoms without resolution. Patient has currently been dealing with a round of headaches that began 2 days ago that has not improved. She states that she was previously treated successfully with a migraine cocktail during an ER visit. She reports associated increased urine frequency. Denies chest pain, shortness of breath, abdominal pain, diarrhea, recent infection.    Home Medications Prior to Admission medications   Medication Sig Start Date End Date Taking? Authorizing Provider  cholecalciferol (VITAMIN D3) 25 MCG (1000 UNIT) tablet Take 1,000 Units by mouth daily.   Yes [provider]  cyclobenzaprine (FLEXERIL) 5 MG tablet Take 5 mg by mouth at bedtime as needed for muscle spasms. 06/30/22  Yes [provider]  desmopressin (DDAVP) 0.1 MG tablet Take 0.5 tablets (0.05 mg total) by mouth at bedtime. 07/24/22  Yes Thurnell Lose, MD  hydrocortisone (CORTEF) 10 MG tablet Take 1 tablet (10 mg total) by mouth 2 (two) times daily. Patient taking differently: Take 10 mg by mouth 2 (two) times daily. 1.5 tab by mouth in the morning, then .5 tab by mouth in the evening 07/24/22  Yes Thurnell Lose, MD  levothyroxine (SYNTHROID) 125 MCG tablet TAKE 1 TABLET BY  MOUTH EVERY DAY BEFORE BREAKFAST Patient taking differently: Take 125 mcg by mouth daily before breakfast. 06/14/22  Yes [name], MD  Magnesium 200 MG TABS Take 1 tablet by mouth daily.   Yes [provider]  Potassium 99 MG TABS Take 1 tablet by mouth daily.   Yes [provider]  topiramate (TOPAMAX) 25 MG tablet 1 tab by mouth at bedtime Patient taking differently: Take 25 mg by mouth at bedtime. 07/17/22  Yes [name], MD  vitamin C (ASCORBIC ACID) 250 MG tablet Take 250 mg by mouth daily.   Yes [provider]  calcium citrate-vitamin D 500-400 MG-UNIT chewable tablet 1 tab by mouth twice daily. Patient not taking: Reported on 08/16/2022 12/29/20   [name], MD  hydrocortisone (PROCTOZONE-HC) 2.5 % rectal cream Place 1 Application rectally 2 (two) times daily. Patient not taking: Reported on 08/24/2022 08/16/22   [name], MD      Allergies    Patient has no known allergies.    Review of Systems   Review of Systems  Constitutional:  Positive for chills and fatigue. Negative for fever.  Respiratory:  Negative for cough, chest tightness and shortness of breath.   Cardiovascular:  Negative for chest pain.  Gastrointestinal:  Positive for nausea and vomiting. Negative for abdominal pain and diarrhea.  Neurological:  Positive for headaches. Negative for syncope.  All other systems reviewed and are negative.   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  Constitutional:      Appearance: She is well-developed.     Comments: Patient looks visibly uncomfortable but is cooperative  10:14 PM Patient general appearance improved significantly after treatment with migraine  cocktail  HENT:     Head: Normocephalic and atraumatic.  Cardiovascular:     Rate and Rhythm: Regular rhythm. Tachycardia present.  Pulmonary:     Effort: Pulmonary effort is normal.     Breath sounds: Normal breath sounds.  Skin:    General: Skin is warm and dry.  Neurological:     Mental Status: She is alert.  Psychiatric:        Mood and Affect: Mood is depressed.        Speech: Speech normal.        Behavior: Behavior normal.     Comments: Patient appears to have a depressed mood but potentially secondary to symptomatic presentation     ED Results / Procedures / Treatments   Labs (all labs ordered are listed, but only abnormal results are displayed) Labs Reviewed  CBC - Abnormal; Notable for the following components:      Result Value   WBC 3.6 (*)    All other components within normal limits  BASIC METABOLIC PANEL - Abnormal; Notable for the following components:   Potassium 3.3 (*)    All other components within normal limits    EKG None  Radiology CT Head Wo Contrast  Result Date: 08/24/2022 CLINICAL DATA:  History of pituitary tumor with headache and vomiting this morning. EXAM: CT HEAD WITHOUT CONTRAST TECHNIQUE: Contiguous axial images were obtained from the base of the skull through the vertex without intravenous contrast. RADIATION DOSE REDUCTION: This exam was performed according to the departmental dose-optimization program which includes automated exposure control, adjustment of the mA and/or kV according to patient size and/or use of iterative reconstruction technique. COMPARISON:  Head CT 07/19/2022, brain MRI 07/20/2022 FINDINGS: Brain: Primarily hypodense pituitary lesion spans 18 mm cranial caudal, unchanged in size from prior head CT. This is overall stable in CT appearance. No evidence of acute hemorrhage. No hydrocephalus. No acute ischemia, subdural/extra-axial collection or midline shift. Vascular: No hyperdense vessel or unexpected calcification.  Skull: No fracture or focal lesion. Sinuses/Orbits: No acute findings. Other: None. IMPRESSION: 1. No acute intracranial abnormality. 2. Unchanged size and CT appearance of known pituitary tumor since last month. Electronically Signed   By: Keith Rake M.D.   On: 08/24/2022 19:22    Procedures Procedures   Medications Ordered in ED Medications  ondansetron (ZOFRAN-ODT) disintegrating tablet 4 mg (4 mg Oral Given 08/24/22 1910)  dexamethasone (DECADRON) injection 10 mg (10 mg Intravenous Given 08/24/22 2111)  diphenhydrAMINE (BENADRYL) injection 25 mg (25 mg Intravenous Given 08/24/22 2110)  ketorolac (TORADOL) 15 MG/ML injection 15 mg (15 mg Intravenous Given 08/24/22 2112)  prochlorperazine (COMPAZINE) injection 10 mg (10 mg Intravenous Given 08/24/22 2113)  sodium chloride 0.9 % bolus 1,000 mL (0 mLs Intravenous Stopped 08/24/22 2153)    ED Course/ Medical Decision Making/ A&P Clinical Course as of 08/24/22 2214  Fri Aug 24, 2022  1857 CT Head Wo Contrast [BM]    Clinical Course User Index [BM] Noemi Chapel, MD  Medical Decision Making Amount and/or Complexity of Data Reviewed Radiology:  Decision-making details documented in ED Course.  Risk Prescription drug management.   This patient presents to the ED for concern of headache, nausea, and vomiting. Differential diagnosis includes tumor, migraine headache, substance withdrawal, gastroenteritis, viral URI    Additional history obtained:  External records from outside source obtained and reviewed including MRI from 07/20/2022 showing pituitary tumor.   Lab Tests:  I Ordered, and personally interpreted labs.  The pertinent results include:  labs reassuring with notable slight decreased potassium   Imaging Studies ordered:  I ordered imaging studies including CT head  I independently visualized and interpreted imaging which showed no acute intracranial abnormality I agree with the radiologist  interpretation   Medicines ordered and prescription drug management:  I ordered medication including decadron, benadryl, toradol, zofran, compazine, and NS  for headache, nausea, and vomiting  Reevaluation of the patient after these medicines showed that the patient improved I have reviewed the patients home medicines and have made adjustments as needed   Problem List / ED Course:  Patient presented to the ER with concerns of nonintractable headache, nausea, and vomiting. Patient was advised by specialist to come into ER if these symptoms appeared and would not resolve for treatment and evaluation. All labs and imaging reassuring at this time with no acute changes to pituitary tumor. Medications administered significantly improved patient condition and she was agreeable to discharge home and outpatient follow up. Advised patient on return precautions and she was agreeable to plan.   Final Clinical Impression(s) / ED Diagnoses Final diagnoses:  Acute nonintractable headache, unspecified headache type  Nausea and vomiting, unspecified vomiting type    Rx / DC Orders ED Discharge Orders     None         Vladimir Creeks 08/24/22 2214    Noemi Chapel, MD 08/26/22 [phone]

## 2022-08-24 NOTE — ED Triage Notes (Signed)
Pt reports she has a pituitary tumor and was told any time she had headache with vomiting she would need to be evaluated in the ED.  Pt states she began having severe headache and vomiting this morning.

## 2022-08-24 NOTE — Discharge Instructions (Addendum)
Le atendieron [employer] en urgencias por dolores de [place], nuseas y vmitos. Le dieron un cctel para la migraa que funcion bien para mejorar sus sntomas. En este momento, asegrese de Optometrist un seguimiento con su endocrinlogo y neurocirujano para Physiological scientist los prximos pasos para controlar sus sntomas.  You were seen in the ER today for headaches, nausea, and vomiting. You were given a migraine cocktail which worked well in improving your symptoms. At this time, please ensure that you follow up with your endocrinologist and neurosurgery to discuss next steps in managing your symptoms.

## 2022-08-24 NOTE — ED Provider Triage Note (Signed)
Emergency Medicine Provider Triage Evaluation Note  Sara Mathis , a 36 y.o. female  was evaluated in triage.  Pt complains of headache, nausea and vomiting.  Patient reports she has history of pituitary tumor.  The patient states that whenever she develops headache she is told to go to the ER for evaluation.  Patient reports that headache and nausea and vomiting began this morning.  Patient unsure if they have gotten worse, or if they have stayed the same level all day.  The patient denies any fevers, onset weakness or numbness.  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  Appropriate orders placed.  Sara Mathis was informed that the remainder of the evaluation will be completed by another provider, this initial triage assessment does not replace that evaluation, and the importance of remaining in the ED until their evaluation is complete.     Azucena Cecil, PA-C 08/24/22 1801

## 2022-08-28 ENCOUNTER — Ambulatory Visit: Payer: Self-pay | Attending: Internal Medicine | Admitting: Physical Therapy

## 2022-09-04 ENCOUNTER — Ambulatory Visit: Payer: Self-pay

## 2022-09-06 ENCOUNTER — Inpatient Hospital Stay (HOSPITAL_COMMUNITY)
Admission: EM | Admit: 2022-09-06 | Discharge: 2022-09-10 | DRG: 644 | Disposition: A | Discharge status: Home / Self Care | Payer: Self-pay | Attending: Family Medicine | Admitting: Family Medicine

## 2022-09-06 ENCOUNTER — Emergency Department (HOSPITAL_COMMUNITY): Payer: Self-pay

## 2022-09-06 DIAGNOSIS — R000 Tachycardia, unspecified: Secondary | ICD-10-CM

## 2022-09-06 DIAGNOSIS — Z1152 Encounter for screening for COVID-19: Secondary | ICD-10-CM

## 2022-09-06 DIAGNOSIS — Z833 Family history of diabetes mellitus: Secondary | ICD-10-CM

## 2022-09-06 DIAGNOSIS — R6510 Systemic inflammatory response syndrome (SIRS) of non-infectious origin without acute organ dysfunction: Secondary | ICD-10-CM | POA: Diagnosis present

## 2022-09-06 DIAGNOSIS — R112 Nausea with vomiting, unspecified: Principal | ICD-10-CM

## 2022-09-06 DIAGNOSIS — E230 Hypopituitarism: Secondary | ICD-10-CM | POA: Diagnosis present

## 2022-09-06 DIAGNOSIS — Z79890 Hormone replacement therapy (postmenopausal): Secondary | ICD-10-CM

## 2022-09-06 DIAGNOSIS — R001 Bradycardia, unspecified: Secondary | ICD-10-CM | POA: Diagnosis present

## 2022-09-06 DIAGNOSIS — E890 Postprocedural hypothyroidism: Principal | ICD-10-CM | POA: Diagnosis present

## 2022-09-06 DIAGNOSIS — Z818 Family history of other mental and behavioral disorders: Secondary | ICD-10-CM

## 2022-09-06 DIAGNOSIS — E236 Other disorders of pituitary gland: Secondary | ICD-10-CM | POA: Diagnosis present

## 2022-09-06 DIAGNOSIS — R197 Diarrhea, unspecified: Secondary | ICD-10-CM | POA: Diagnosis present

## 2022-09-06 DIAGNOSIS — E232 Diabetes insipidus: Secondary | ICD-10-CM | POA: Diagnosis present

## 2022-09-06 DIAGNOSIS — Z8249 Family history of ischemic heart disease and other diseases of the circulatory system: Secondary | ICD-10-CM

## 2022-09-06 DIAGNOSIS — E876 Hypokalemia: Secondary | ICD-10-CM | POA: Diagnosis not present

## 2022-09-06 DIAGNOSIS — N179 Acute kidney failure, unspecified: Secondary | ICD-10-CM | POA: Diagnosis present

## 2022-09-06 DIAGNOSIS — E0580 Other thyrotoxicosis without thyrotoxic crisis or storm: Secondary | ICD-10-CM | POA: Diagnosis present

## 2022-09-06 DIAGNOSIS — Z79899 Other long term (current) drug therapy: Secondary | ICD-10-CM

## 2022-09-06 DIAGNOSIS — H538 Other visual disturbances: Secondary | ICD-10-CM | POA: Diagnosis present

## 2022-09-06 HISTORY — DX: Headache, unspecified: R51.9

## 2022-09-06 LAB — BASIC METABOLIC PANEL
Anion gap: 9 (ref 5–15)
BUN: 7 mg/dL (ref 6–20)
CO2: 24 mmol/L (ref 22–32)
Calcium: 9.2 mg/dL (ref 8.9–10.3)
Chloride: 104 mmol/L (ref 98–111)
Creatinine, Ser: 1.03 mg/dL — ABNORMAL HIGH (ref 0.44–1.00)
GFR, Estimated: >60 mL/min (ref >60)
Glucose, Bld: 96 mg/dL (ref 70–99)
Potassium: 3 mmol/L — ABNORMAL LOW (ref 3.5–5.1)
Sodium: 137 mmol/L (ref 135–145)

## 2022-09-06 LAB — CBC WITH DIFFERENTIAL/PLATELET
Abs Immature Granulocytes: 0.01 10*3/uL (ref 0.00–0.07)
Basophils Absolute: 0 10*3/uL (ref 0.0–0.1)
Basophils Relative: 1 %
Eosinophils Absolute: 0 10*3/uL (ref 0.0–0.5)
Eosinophils Relative: 0 %
HCT: 41.6 % (ref 36.0–46.0)
Hemoglobin: 13.4 g/dL (ref 12.0–15.0)
Immature Granulocytes: 0 %
Lymphocytes Relative: 38 %
Lymphs Abs: 2.5 10*3/uL (ref 0.7–4.0)
MCH: 29.3 pg (ref 26.0–34.0)
MCHC: 32.2 g/dL (ref 30.0–36.0)
MCV: 91 fL (ref 80.0–100.0)
Monocytes Absolute: 0.5 10*3/uL (ref 0.1–1.0)
Monocytes Relative: 8 %
Neutro Abs: 3.5 10*3/uL (ref 1.7–7.7)
Neutrophils Relative %: 53 %
Platelets: 224 10*3/uL (ref 150–400)
RBC: 4.57 MIL/uL (ref 3.87–5.11)
RDW: 12 % (ref 11.5–15.5)
WBC: 6.5 10*3/uL (ref 4.0–10.5)
nRBC: 0 % (ref 0.0–0.2)

## 2022-09-06 LAB — URINALYSIS, ROUTINE W REFLEX MICROSCOPIC
Bacteria, UA: NONE SEEN
Bilirubin Urine: NEGATIVE
Glucose, UA: NEGATIVE mg/dL
Hgb urine dipstick: NEGATIVE
Ketones, ur: NEGATIVE mg/dL
Nitrite: NEGATIVE
Protein, ur: NEGATIVE mg/dL
Specific Gravity, Urine: 1.01 (ref 1.005–1.030)
pH: 8 (ref 5.0–8.0)

## 2022-09-06 LAB — RESP PANEL BY RT-PCR (RSV, FLU A&B, COVID)  RVPGX2
Influenza A by PCR: NEGATIVE
Influenza B by PCR: NEGATIVE
Resp Syncytial Virus by PCR: NEGATIVE
SARS Coronavirus 2 by RT PCR: NEGATIVE

## 2022-09-06 NOTE — ED Triage Notes (Signed)
Patient with history of pituitary gland tumor that has not been removed here with complaint of emesis, headache, and blurred vision that started this morning. Patient is currently alert and oriented.

## 2022-09-06 NOTE — ED Provider Triage Note (Signed)
Emergency Medicine Provider Triage Evaluation Note  Sara Mathis , a 36 y.o. female  was evaluated in triage.  Pt complains of headache, emesis, blurred vision that started this morning.  Patient also complaining of tremors.  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  Patient was reportedly supposed to see a specialist yesterday but left due to feeling unwell in the waiting room.  Patient was admitted to the hospital for acute encephalopathy and November of this year was also seen on December 8 for acute non-intractable headache.  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  Appropriate orders placed.  Sara Mathis was informed that the remainder of the evaluation will be completed by another provider, this initial triage assessment does not replace that evaluation, and the importance of remaining in the ED until their evaluation is complete.     Dorothyann Peng, PA-C 09/06/22 1555

## 2022-09-07 ENCOUNTER — Emergency Department (HOSPITAL_COMMUNITY): Payer: Self-pay

## 2022-09-07 DIAGNOSIS — E232 Diabetes insipidus: Secondary | ICD-10-CM | POA: Diagnosis present

## 2022-09-07 DIAGNOSIS — R6510 Systemic inflammatory response syndrome (SIRS) of non-infectious origin without acute organ dysfunction: Secondary | ICD-10-CM | POA: Diagnosis present

## 2022-09-07 DIAGNOSIS — E0580 Other thyrotoxicosis without thyrotoxic crisis or storm: Secondary | ICD-10-CM

## 2022-09-07 DIAGNOSIS — E230 Hypopituitarism: Secondary | ICD-10-CM

## 2022-09-07 DIAGNOSIS — E236 Other disorders of pituitary gland: Secondary | ICD-10-CM

## 2022-09-07 DIAGNOSIS — R197 Diarrhea, unspecified: Secondary | ICD-10-CM

## 2022-09-07 LAB — I-STAT BETA HCG BLOOD, ED (MC, WL, AP ONLY): I-stat hCG, quantitative: <5 m[IU]/mL (ref <5)

## 2022-09-07 LAB — RESPIRATORY PANEL BY PCR

## 2022-09-07 LAB — LACTIC ACID, PLASMA
Lactic Acid, Venous: 1.7 mmol/L (ref 0.5–1.9)
Lactic Acid, Venous: 1.4 mmol/L (ref 0.5–1.9)

## 2022-09-07 LAB — COMPREHENSIVE METABOLIC PANEL
ALT: 15 U/L (ref 0–44)
AST: 20 U/L (ref 15–41)
Albumin: 3.6 g/dL (ref 3.5–5.0)
Alkaline Phosphatase: 39 U/L (ref 38–126)
Anion gap: 10 (ref 5–15)
BUN: 10 mg/dL (ref 6–20)
CO2: 22 mmol/L (ref 22–32)
Calcium: 9 mg/dL (ref 8.9–10.3)
Chloride: 103 mmol/L (ref 98–111)
Creatinine, Ser: 1.01 mg/dL — ABNORMAL HIGH (ref 0.44–1.00)
GFR, Estimated: >60 mL/min (ref >60)
Glucose, Bld: 82 mg/dL (ref 70–99)
Potassium: 3.6 mmol/L (ref 3.5–5.1)
Sodium: 135 mmol/L (ref 135–145)
Total Bilirubin: 1.1 mg/dL (ref 0.3–1.2)
Total Protein: 6.7 g/dL (ref 6.5–8.1)

## 2022-09-07 LAB — CBC WITH DIFFERENTIAL/PLATELET
Abs Immature Granulocytes: 0.01 10*3/uL (ref 0.00–0.07)
Basophils Absolute: 0 10*3/uL (ref 0.0–0.1)
Basophils Relative: 0 %
Eosinophils Absolute: 0 10*3/uL (ref 0.0–0.5)
Eosinophils Relative: 0 %
HCT: 42.2 % (ref 36.0–46.0)
Hemoglobin: 13.9 g/dL (ref 12.0–15.0)
Immature Granulocytes: 0 %
Lymphocytes Relative: 36 %
Lymphs Abs: 2.5 10*3/uL (ref 0.7–4.0)
MCH: 29.9 pg (ref 26.0–34.0)
MCHC: 32.9 g/dL (ref 30.0–36.0)
MCV: 90.8 fL (ref 80.0–100.0)
Monocytes Absolute: 0.9 10*3/uL (ref 0.1–1.0)
Monocytes Relative: 13 %
Neutro Abs: 3.6 10*3/uL (ref 1.7–7.7)
Neutrophils Relative %: 51 %
Platelets: 172 10*3/uL (ref 150–400)
RBC: 4.65 MIL/uL (ref 3.87–5.11)
RDW: 12.1 % (ref 11.5–15.5)
WBC: 7 10*3/uL (ref 4.0–10.5)
nRBC: 0 % (ref 0.0–0.2)

## 2022-09-07 LAB — T4, FREE: Free T4: 2.43 ng/dL — ABNORMAL HIGH (ref 0.61–1.12)

## 2022-09-07 LAB — PROTIME-INR
INR: 1.1 (ref 0.8–1.2)
Prothrombin Time: 14.3 seconds (ref 11.4–15.2)

## 2022-09-07 LAB — MAGNESIUM: Magnesium: 1.6 mg/dL — ABNORMAL LOW (ref 1.7–2.4)

## 2022-09-07 LAB — APTT: aPTT: 31 seconds (ref 24–36)

## 2022-09-07 LAB — TSH: TSH: <0.01 u[IU]/mL — ABNORMAL LOW (ref 0.350–4.500)

## 2022-09-07 LAB — C-REACTIVE PROTEIN: CRP: 5.8 mg/dL — ABNORMAL HIGH (ref <1.0)

## 2022-09-07 MED ORDER — POTASSIUM CHLORIDE 10 MEQ/100ML IV SOLN
10.0000 meq | Freq: Once | INTRAVENOUS | Status: AC
  Administered 2022-09-07: 10 meq via INTRAVENOUS
  Filled 2022-09-07: qty 100

## 2022-09-07 MED ORDER — SODIUM CHLORIDE 0.9 % IV SOLN: INTRAVENOUS | Status: DC

## 2022-09-07 MED ORDER — PANTOPRAZOLE SODIUM 40 MG IV SOLR
40.0000 mg | INTRAVENOUS | Status: DC
  Administered 2022-09-07 – 2022-09-10 (×4): 40 mg via INTRAVENOUS
  Filled 2022-09-07 (×4): qty 10

## 2022-09-07 MED ORDER — HYDROCORTISONE SOD SUC (PF) 100 MG IJ SOLR
100.0000 mg | Freq: Three times a day (TID) | INTRAMUSCULAR | Status: AC
  Administered 2022-09-07 – 2022-09-10 (×9): 100 mg via INTRAVENOUS
  Filled 2022-09-07 (×9): qty 2

## 2022-09-07 MED ORDER — ONDANSETRON HCL 4 MG PO TABS: 4.0000 mg | ORAL_TABLET | Freq: Four times a day (QID) | ORAL | Status: DC | PRN

## 2022-09-07 MED ORDER — LACTATED RINGERS IV BOLUS
1000.0000 mL | Freq: Once | INTRAVENOUS | Status: AC
  Administered 2022-09-07: 1000 mL via INTRAVENOUS

## 2022-09-07 MED ORDER — METOPROLOL TARTRATE 5 MG/5ML IV SOLN
2.5000 mg | Freq: Once | INTRAVENOUS | Status: AC
  Administered 2022-09-07: 2.5 mg via INTRAVENOUS
  Filled 2022-09-07: qty 5

## 2022-09-07 MED ORDER — TOPIRAMATE 25 MG PO TABS
25.0000 mg | ORAL_TABLET | Freq: Every day | ORAL | Status: DC
  Administered 2022-09-07 – 2022-09-09 (×3): 25 mg via ORAL
  Filled 2022-09-07 (×3): qty 1

## 2022-09-07 MED ORDER — SODIUM CHLORIDE 0.9 % IV SOLN
2.0000 g | INTRAVENOUS | Status: DC
  Administered 2022-09-07 – 2022-09-09 (×3): 2 g via INTRAVENOUS
  Filled 2022-09-07 (×3): qty 20

## 2022-09-07 MED ORDER — HYDROCORTISONE 5 MG PO TABS
5.0000 mg | ORAL_TABLET | Freq: Every evening | ORAL | Status: DC
  Filled 2022-09-07: qty 1

## 2022-09-07 MED ORDER — HYDROCORTISONE SOD SUC (PF) 100 MG IJ SOLR
100.0000 mg | Freq: Once | INTRAMUSCULAR | Status: AC
  Administered 2022-09-07: 100 mg via INTRAVENOUS
  Filled 2022-09-07: qty 2

## 2022-09-07 MED ORDER — ACETAMINOPHEN 650 MG RE SUPP: 650.0000 mg | Freq: Four times a day (QID) | RECTAL | Status: DC | PRN

## 2022-09-07 MED ORDER — HYDROCORTISONE 5 MG PO TABS: 5.0000 mg | ORAL_TABLET | ORAL | Status: DC

## 2022-09-07 MED ORDER — SODIUM CHLORIDE 0.9% FLUSH
3.0000 mL | Freq: Two times a day (BID) | INTRAVENOUS | Status: DC
  Administered 2022-09-07 – 2022-09-10 (×4): 3 mL via INTRAVENOUS

## 2022-09-07 MED ORDER — ONDANSETRON HCL 4 MG/2ML IJ SOLN
4.0000 mg | Freq: Once | INTRAMUSCULAR | Status: AC
  Administered 2022-09-07: 4 mg via INTRAVENOUS
  Filled 2022-09-07: qty 2

## 2022-09-07 MED ORDER — MAGNESIUM SULFATE 2 GM/50ML IV SOLN
2.0000 g | Freq: Once | INTRAVENOUS | Status: AC
  Administered 2022-09-07: 2 g via INTRAVENOUS
  Filled 2022-09-07: qty 50

## 2022-09-07 MED ORDER — ACETAMINOPHEN 325 MG PO TABS
650.0000 mg | ORAL_TABLET | Freq: Four times a day (QID) | ORAL | Status: DC | PRN
  Administered 2022-09-07 – 2022-09-08 (×2): 650 mg via ORAL
  Filled 2022-09-07 (×2): qty 2

## 2022-09-07 MED ORDER — HYDROCORTISONE 5 MG PO TABS: 15.0000 mg | ORAL_TABLET | Freq: Every day | ORAL | Status: DC

## 2022-09-07 MED ORDER — VANCOMYCIN HCL 1500 MG/300ML IV SOLN
1500.0000 mg | INTRAVENOUS | Status: DC
  Administered 2022-09-07 – 2022-09-09 (×3): 1500 mg via INTRAVENOUS
  Filled 2022-09-07 (×4): qty 300

## 2022-09-07 MED ORDER — ALBUTEROL SULFATE (2.5 MG/3ML) 0.083% IN NEBU: 2.5000 mg | INHALATION_SOLUTION | Freq: Four times a day (QID) | RESPIRATORY_TRACT | Status: DC | PRN

## 2022-09-07 MED ORDER — ACETAMINOPHEN 500 MG PO TABS
1000.0000 mg | ORAL_TABLET | Freq: Once | ORAL | Status: AC
  Administered 2022-09-07: 1000 mg via ORAL
  Filled 2022-09-07: qty 2

## 2022-09-07 MED ORDER — ONDANSETRON HCL 4 MG/2ML IJ SOLN
4.0000 mg | Freq: Four times a day (QID) | INTRAMUSCULAR | Status: DC | PRN
  Administered 2022-09-07: 4 mg via INTRAVENOUS
  Filled 2022-09-07: qty 2

## 2022-09-07 MED ORDER — DESMOPRESSIN ACETATE 0.1 MG PO TABS
0.1000 mg | ORAL_TABLET | Freq: Every day | ORAL | Status: DC
  Administered 2022-09-07 – 2022-09-09 (×3): 0.1 mg via ORAL
  Filled 2022-09-07 (×5): qty 1

## 2022-09-07 MED ORDER — METOPROLOL TARTRATE 5 MG/5ML IV SOLN: 2.5000 mg | Freq: Four times a day (QID) | INTRAVENOUS | Status: DC | PRN

## 2022-09-07 NOTE — Progress Notes (Signed)
PHARMACY ANTIBIOTIC CONSULT NOTE   Sara Mathis a 35 y.o. female admitted on 09/07/22 with emesis, diarrhea, chills, chronic headache, and blurry vision. Patient had a similar presentation in early November and found to have pituitary tumor. Pharmacy has been consulted for vancomycin dosing.  12/22: Scr 1.01, LA 1.4,  WBC 7.0  Vital Signs: Tm 101.2, HR 115, BP variable  Plan: GIVE Vancomycin 1,500 mg IV x1 (Wt used: 73 kg- per flowsheet data)  THEN Vancomycin 1,500 mg IV Q24H (Scr used: 1.01, Vd used: 0.72, eAUC: 451) START Ceftriaxone 2g IV Q24H  Monitor renal function, clinical status, de-escalation, C/S, levels as indicated   Allergies:  No Known Allergies  There were no vitals filed for this visit.     Latest Ref Rng & Units 09/07/2022    2:50 AM 09/06/2022    4:30 PM 08/24/2022    4:45 PM  CBC  WBC 4.0 - 10.5 K/uL 7.0  6.5  3.6   Hemoglobin 12.0 - 15.0 g/dL 13.9  13.4  13.2   Hematocrit 36.0 - 46.0 % 42.2  41.6  39.7   Platelets 150 - 400 K/uL 172  224  215     Antimicrobials this admission: Vancomycin 12/22>>c Ceftriaxone 12/22>>c  Microbiology results: 12/22 Bcx: sent  Thank you for allowing pharmacy to be a part of this patient's care.  Adria Dill, PharmD PGY-2 Infectious Diseases Resident  09/07/2022 12:33 PM

## 2022-09-07 NOTE — ED Notes (Signed)
Dr. Tamala Julian paged and notified of pt's persistent tachycardia. Pt in NAD. Requesting ice water. Support person at bedside. Other VS stable.

## 2022-09-07 NOTE — H&P (Signed)
History and Physical    Patient: Sara Mathis ZES:[id number] DOB: Feb 10, 1986 DOA: 09/06/2022 DOS: the patient was seen and examined on 09/07/2022 PCP: [name], MD  Patient coming from: Home  Chief Complaint:  Chief Complaint  Patient presents with   Emesis   HPI: Sara Mathis is a 36 y.o. female with medical history significant of migraine headaches and Graves' disease s/p total thyroidectomy who presents with complaints of nausea and headache.  Patient feels that her vision is blurry and possibly a little worse than prior.  Reports associated symptoms of chills, dizziness, nasal congestion, vomiting, diarrhea, and some abdominal discomfort.  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  Initially met SIRS criteria and have been admitted by PCCM due to agitation requiring Precedex drip.  There was concern for meningitis, but LP cultures were negative.  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  Neurosurgery had been consulted, but felt no acute need for surgical intervention and for patient to follow-up in outpatient setting.  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  She followed up with Dr. [name] on 11/22, and patient reports that she has been taking all of her medications as prescribed.  In the emergency department patient was seen to be febrile up to 101.2 F with tachycardia and tachypnea meeting SIRS criteria.  CT scan of the head showed enlarged sella with cystic appearing lesion in the pituitary region without interval change.  Labs significant for potassium 3->3.6, BUN-7->10, creatinine-1.03->1.01, magnesium-1.6, TSH- <0.01,   free T4-2.43, and lactic acid- 1.7->1.4.  Chest x-ray noted no acute abnormality.  Urinalysis noted trace leukocytes with no bacteria seen or significant signs of infection. Blood and urine cultures were obtained.  No clear source of infection was appreciated and therefore antibiotics were not started.  Patient was given 1 L of lactated Ringer's, acetaminophen 1000 mg p.o., Zofran IV, potassium chloride 10 mEq IV, 2 g of magnesium sulfate, and hydrocortisone 100 mg IV.  Review of Systems: As mentioned in the history of present illness. All other systems reviewed and are negative. Past Medical History:  Diagnosis Date   Overweight (BMI 25.0-29.9) 06/10/2017   Thyroid disease 02/21/2015   Grave's disease resulting in total thyroidectomy   Past Surgical History:  Procedure Laterality Date   THERAPEUTIC ABORTION  06/2020   At 6 weeks--oral meds did not work   THYROIDECTOMY Bilateral 02/21/2015   For Grave's Disease not well controlled on medication   Social History:  reports that she has never smoked. She has never been exposed to tobacco smoke. She has never used smokeless tobacco. She reports that she does not currently use alcohol. She reports that she does not use drugs.  No Known Allergies  Family History  Problem Relation Age of Onset   Diabetes Mother    Fibromyalgia Mother    Heart disease Mother        age 31 and 82   Diabetes Father    ADD / ADHD Brother     Prior to Admission medications   Medication Sig Start Date End Date Taking? Authorizing Provider  calcium citrate-vitamin D 500-400 MG-UNIT chewable tablet 1 tab by mouth twice daily. Patient not taking: Reported on 08/16/2022 12/29/20   [name],  MD  cholecalciferol (VITAMIN D3) 25 MCG (1000 UNIT) tablet Take 1,000 Units by mouth daily.    [provider]  cyclobenzaprine (FLEXERIL) 5 MG tablet Take 5 mg by mouth at bedtime as needed for muscle spasms. 06/30/22   [provider]  desmopressin  (DDAVP) 0.1 MG tablet Take 0.5 tablets (0.05 mg total) by mouth at bedtime. 07/24/22   Thurnell Lose, MD  hydrocortisone (CORTEF) 10 MG tablet Take 1 tablet (10 mg total) by mouth 2 (two) times daily. Patient taking differently: Take 10 mg by mouth 2 (two) times daily. 1.5 tab by mouth in the morning, then .5 tab by mouth in the evening 07/24/22   Thurnell Lose, MD  hydrocortisone (PROCTOZONE-HC) 2.5 % rectal cream Place 1 Application rectally 2 (two) times daily. Patient not taking: Reported on 08/24/2022 08/16/22   [name], MD  levothyroxine (SYNTHROID) 125 MCG tablet TAKE 1 TABLET BY MOUTH EVERY DAY BEFORE BREAKFAST Patient taking differently: Take 125 mcg by mouth daily before breakfast. 06/14/22   [name], MD  Magnesium 200 MG TABS Take 1 tablet by mouth daily.    [provider]  Potassium 99 MG TABS Take 1 tablet by mouth daily.    [provider]  topiramate (TOPAMAX) 25 MG tablet 1 tab by mouth at bedtime Patient taking differently: Take 25 mg by mouth at bedtime. 07/17/22   [name], MD  vitamin C (ASCORBIC ACID) 250 MG tablet Take 250 mg by mouth daily.    [provider]    Physical Exam: Vitals:   09/07/22 0445 09/07/22 0530 09/07/22 0630 09/07/22 0635  BP: 96/70 106/84 108/86   Pulse: (!) 137 (!) 128 (!) 125   Resp: (!) 38 19 (!) 25   Temp:    97.9 F (36.6 C)  TempSrc:    Oral  SpO2: 97% 98% 99%    Constitutional: Young female who appears ill Eyes: PERRL, lids and conjunctivae normal ENMT: Mucous membranes are moist. Posterior pharynx clear of any exudate or lesions. Fair dentition Neck: normal, supple, no masses, no thyromegaly Respiratory: clear to auscultation bilaterally, no wheezing, no crackles. Normal respiratory effort. No accessory muscle use.  Cardiovascular: Regular rate and rhythm, no murmurs / rubs / gallops. No extremity edema. 2+ pedal pulses .  Abdomen: no tenderness, no masses palpated.    Bowel sounds positive.  Musculoskeletal: no clubbing / cyanosis. No joint deformity upper and lower extremities. Good ROM, no contractures. Normal muscle tone.  Skin: no rashes, lesions, ulcers. No induration Neurologic: CN 2-12 grossly intact.  Tremor present strength 5/5 in all 4.  Psychiatric: Normal judgment and insight. Alert and oriented x 3. Normal mood.   Data Reviewed:  EKG reveals sinus tachycardia 138 bpm  Assessment and Plan: SIRS/sepsis of unknown source Patient reports having nausea, vomiting, diarrhea, sinus congestion, and persistent blurry vision.  Found to have fever up to 101.2 F with tachycardia and tachypnea meeting SIRS criteria.  WBC and lactic acid levels were within normal limits.  She had similar presentation last month where she was worked up with LP and evaluated by ID.  Question possible virus or GI source of symptoms. -Admit to a telemetry bed -Follow-up blood and urine cultures -Check complete respiratory virus panel and GI panel -Add-on CRP(5.8 -Compared antibiotics of vancomycin and Rocephin -Consulted infectious disease, will follow-up for any further recommendations  Question iatrogenic thyrotoxicosis Acute.  Patient with history of Graves' disease status post total thyroidectomy on levothyroxine 125 mcg  daily. Labs noted Free T4-2.43 with TSH- <0.01.  Review of records note free T4 levels appear to have been 09/2019.  Dr. [name] out of the office and discussed with his partner who agreed with need to hold levothyroxine for at least 1 week and decreasing dose.  Unclear that all of patient's symptoms could be caused by this. -Hold levothyroxine for at least 1 week due to the half-life   -Metoprolol 2.5 mg IV Q6hr  as needed for heart rates greater than 110 or for heart  Hypomagnesemia Acute.  Magnesium 1.6.  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  Had been seen by neurosurgery during last admission and recommended to follow-up in outpatient setting is reported no acute need for surgical intervention at that time.  CT scan of the brain did not note any acute change in the cystic pituitary lesion.  Patient has been given hydrocortisone 100 mg IV x 1 dose possible adrenal insufficiency in the setting impression. -Continue stress dosing of hydrocortisone IV in the setting of possible  Central diabetes insipidus Evaluated by nephrology during last hospitalization and placed on desmopressin. -Continue desmopressin  DVT prophylaxis: SCDs Advance Care Planning:   Code Status: Full Code   Consults: Infectious disease  Family Communication: Updated at bedside  Severity of Illness: The appropriate patient status for this patient is INPATIENT. Inpatient status is judged to be reasonable and necessary in order to provide the required intensity of service to ensure the patient's safety. The patient's presenting symptoms, physical exam findings, and initial radiographic and laboratory data in the context of their chronic comorbidities is felt to place them at high risk for further clinical deterioration. Furthermore, it is not anticipated that the patient will be medically stable for discharge from the hospital within 2 midnights of admission.   * I certify that at the point of admission it is my clinical judgment that the patient will require inpatient hospital care spanning beyond 2 midnights from the point of admission due to high intensity of service, high risk for further deterioration and high frequency of surveillance required.*  Author: Norval Morton, MD 09/07/2022 7:09 AM  For on call review [email or web address].

## 2022-09-07 NOTE — Consult Note (Signed)
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 She follows endocrinology and on hormones as mentioned. LP csf cx and pcr was negative; she has low glucose but also moderate wbc and minimal rbc.   I do query if in retrospect she might have some aseptic meningitis   This admission -- head ct stable pituitary change, cxr clear  At this time the only new sx is the uri/diarrhea-gi sx. I suspect clinically she might have a viral upper respiratory of enterocolitis picture. I do not suspect a bacterial/hsv cns process    12/22 bcx ngtd  Plan: Vanc/cetriaxone and can deescalate based on blood culture Would increase dose of hydrocortisone in setting acute illness for the next 3-5 days Gi pcr and respiratory viral pcr Continue supportive care as is otherwise Dicussed with primary team    I spent 75 minute reviewing data/chart, and coordinating care and >50% direct face to face time providing counseling/discussing diagnostics/treatment plan with patient       ------------------------------------------------ Principal Problem:   SIRS (systemic inflammatory response syndrome) (HCC)    HPI: Sara Mathis is a 36 y.o. 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 Patient is here with her boyfriend. Patient does speak some english  She has an 11/02 admission with confusion, acute on chronic headache, and fever. 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 She follows endocrinology and on hormones as mentioned. LP csf cx and pcr was negative; she has low glucose but also moderate wbc and minimal rbc.   He was referred to outpatient endo and nephrology and do follow at least endocrinology  She reports the headache/visual change has been the same but several days has nasal congestion, nausea, vomiting, poor intake and the last 2-3 days watery diarrhea  Also has subjective f/c  No rash, myalgia, arthralgia, cough, chest pain, abd pain, dysuria, numbness, tingling, dysuria, frequency/urgency, nightsweat, weight loss   No sick contact  Lives alone; boyfriend visiting.  Came from [place] 9 years ago and had lived in [place] for 2 years. No recent travel outside of [place]  Hasn't worked since July in setting headache and this pituitary mass dx  Afebrile here No leukocytosis Sinus tach but normal blood pressure Head ct stable Chest xray clear Bcx in progress   Family History  Problem Relation Age of Onset   Diabetes Mother    Fibromyalgia Mother    Heart disease  Mother        age 66 and 23   Diabetes Father    ADD / ADHD Brother     Social History   Tobacco Use   Smoking status: Never    Passive exposure: Never   Smokeless tobacco: Never  Vaping Use    Vaping Use: Never used  Substance Use Topics   Alcohol use: Not Currently    Alcohol/week: 0.0 standard drinks of alcohol    Comment: whiskey or tequila shot maybe twice monthly   Drug use: No    No Known Allergies  Review of Systems: ROS All Other ROS was negative, except mentioned above   Past Medical History:  Diagnosis Date   Overweight (BMI 25.0-29.9) 06/10/2017   Thyroid disease 02/21/2015   Grave's disease resulting in total thyroidectomy       Scheduled Meds:  desmopressin  0.1 mg Oral QHS   [START ON 09/08/2022] hydrocortisone  15 mg Oral QAC breakfast   hydrocortisone  5 mg Oral QPM   pantoprazole (PROTONIX) IV  40 mg Intravenous Q24H   sodium chloride flush  3 mL Intravenous Q12H   topiramate  25 mg Oral QHS   Continuous Infusions:  sodium chloride 100 mL/hr at 09/07/22 0812   cefTRIAXone (ROCEPHIN)  IV     vancomycin     PRN Meds:.acetaminophen **OR** acetaminophen, albuterol, ondansetron **OR** ondansetron (ZOFRAN) IV   OBJECTIVE: Blood pressure 101/80, pulse (!) 115, temperature 98.2 F (36.8 C), temperature source Oral, resp. rate 12, last menstrual period 04/05/2021, SpO2 99 %, unknown if currently breastfeeding.  Physical Exam  General/constitutional: shaking chill, appears uncomfortable, cooperative though, conversant HEENT: Normocephalic, PER, Conj Clear, EOMI, Oropharynx clear Neck supple CV: rrr no mrg Lungs: clear to auscultation, normal respiratory effort Abd: Soft, Nontender Ext: no edema Skin: No Rash Neuro: nonfocal MSK: no peripheral joint swelling/tenderness/warmth; back spines nontender   Lab Results Lab Results  Component Value Date   WBC 7.0 09/07/2022   HGB 13.9 09/07/2022   HCT 42.2 09/07/2022   MCV 90.8 09/07/2022   PLT 172 09/07/2022    Lab Results  Component Value Date   CREATININE 1.01 (H) 09/07/2022   BUN 10 09/07/2022   NA 135 09/07/2022   K 3.6 09/07/2022   CL 103 09/07/2022   CO2 22 09/07/2022    Lab  Results  Component Value Date   ALT 15 09/07/2022   AST 20 09/07/2022   ALKPHOS 39 09/07/2022   BILITOT 1.1 09/07/2022      Microbiology: Recent Results (from the past 240 hour(s))  Resp panel by RT-PCR (RSV, Flu A&B, Covid) Anterior Nasal Swab     Status: None   Collection Time: 09/06/22  3:56 PM   Specimen: Anterior Nasal Swab  Result Value Ref Range Status   SARS Coronavirus 2 by RT PCR NEGATIVE NEGATIVE Final    Comment: (NOTE) SARS-CoV-2 target nucleic acids are NOT DETECTED.  The SARS-CoV-2 RNA is generally detectable in upper respiratory specimens during the acute phase of infection. The lowest concentration of SARS-CoV-2 viral copies this assay can detect is 138 copies/mL. A negative result does not preclude SARS-Cov-2 infection and should not be used as the sole basis for treatment or other patient management decisions. A negative result may occur with  improper specimen collection/handling, submission of specimen other than nasopharyngeal swab, presence of viral mutation(s) within the areas targeted by this assay, and inadequate number of viral copies(<138 copies/mL). A negative result must be combined with clinical  observations, patient history, and epidemiological information. The expected result is Negative.  Fact Sheet for Patients:  EntrepreneurPulse.com.au  Fact Sheet for Healthcare Providers:  IncredibleEmployment.be  This test is no t yet approved or cleared by the [place] FDA and  has been authorized for detection and/or diagnosis of SARS-CoV-2 by FDA under an Emergency Use Authorization (EUA). This EUA will remain  in effect (meaning this test can be used) for the duration of the COVID-19 declaration under Section 564(b)(1) of the Act, 21 U.S.C.section 360bbb-3(b)(1), unless the authorization is terminated  or revoked sooner.       Influenza A by PCR NEGATIVE NEGATIVE Final   Influenza B by PCR NEGATIVE  NEGATIVE Final    Comment: (NOTE) The Xpert Xpress SARS-CoV-2/FLU/RSV plus assay is intended as an aid in the diagnosis of influenza from Nasopharyngeal swab specimens and should not be used as a sole basis for treatment. Nasal washings and aspirates are unacceptable for Xpert Xpress SARS-CoV-2/FLU/RSV testing.  Fact Sheet for Patients: EntrepreneurPulse.com.au  Fact Sheet for Healthcare Providers: IncredibleEmployment.be  This test is not yet approved or cleared by the [place] FDA and has been authorized for detection and/or diagnosis of SARS-CoV-2 by FDA under an Emergency Use Authorization (EUA). This EUA will remain in effect (meaning this test can be used) for the duration of the COVID-19 declaration under Section 564(b)(1) of the Act, 21 U.S.C. section 360bbb-3(b)(1), unless the authorization is terminated or revoked.     Resp Syncytial Virus by PCR NEGATIVE NEGATIVE Final    Comment: (NOTE) Fact Sheet for Patients: EntrepreneurPulse.com.au  Fact Sheet for Healthcare Providers: IncredibleEmployment.be  This test is not yet approved or cleared by the [place] FDA and has been authorized for detection and/or diagnosis of SARS-CoV-2 by FDA under an Emergency Use Authorization (EUA). This EUA will remain in effect (meaning this test can be used) for the duration of the COVID-19 declaration under Section 564(b)(1) of the Act, 21 U.S.C. section 360bbb-3(b)(1), unless the authorization is terminated or revoked.  Performed at [facility], [place] [address], [place], [place] [address]   Blood Culture (routine x 2)     Status: None (Preliminary result)   Collection Time: 09/07/22  2:50 AM   Specimen: BLOOD  Result Value Ref Range Status   Specimen Description BLOOD RIGHT ANTECUBITAL  Final   Special Requests   Final    BOTTLES DRAWN AEROBIC AND ANAEROBIC Blood Culture adequate volume    Culture   Final    NO GROWTH < 12 HOURS Performed at [facility], [place] [address], [place], [place] [address]    Report Status PENDING  Incomplete     Serology:    Imaging: If present, new imagings (plain films, ct scans, and mri) have been personally visualized and interpreted; radiology reports have been reviewed. Decision making incorporated into the Impression / Recommendations.  12/21 cxr IMPRESSION: No evidence of acute cardiopulmonary disease.  12/21 ct head FINDINGS: Brain: No evidence of acute infarction, hemorrhage, hydrocephalus, extra-axial collection or mass effect. Cystic appearing lesion with expansion of the sella is a stable finding measuring about 1.8 cm. Vascular: No hyperdense vessel or unexpected calcification. Skull: Normal. Negative for fracture or focal lesion. Sinuses/Orbits: No acute finding. 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 No hydrocephalus. No mass effect or shift.    Previous admission 07/20/22 mri 1.1 x 1.8 x 1.9 cm centrally cystic and peripherally enhancing lesion occupying  the sella turcica and extending into the suprasellar cistern, as described. There are apparent chronic blood products along the periphery of this lesion. Favored differential considerations include a cystic/necrotic pituitary macroadenoma, sequela of pituitary infarct/hemorrhage (as can be seen in the setting of pituitary apoplexy) or large Rathke's cyst (with prior hemorrhage). Craniopharyngioma is considered less likely as these lesions are not typically cystic in adult patients. The lesion encroaches upon the optic apparatus. No evidence of cavernous sinus extension. Otherwise unremarkable MRI appearance of the brain. Mild mucosal thickening within the paranasal sinuses.  Jabier Mutton, [facility] [place] [phone] pager    09/07/2022, 12:55  PM

## 2022-09-07 NOTE — ED Provider Notes (Signed)
[facility] EMERGENCY DEPARTMENT Provider Note   CSN: [id number] Arrival date & time: 09/06/22  1522     History  Chief Complaint  Patient presents with   Emesis    Sara Mathis is a 36 y.o. female.  36 year old female who speaks Spanish but is not speaking this point so history is supplemented by chart review and boyfriend.  Patient was diagnosed with a pituitary tumor couple months ago.  This was to get surgery but has not happened yet and has had multiple ER visits and 1 hospitalizations secondary to symptoms related to it.  She is here today with persistent emesis and not feeling well with a fever.  Also having chills and her chronic headache, blurry vision that she has been dealing with since the tumor was diagnosed.  Denies neck pain, cough does have some abdominal pain.  Has been taking medications as prescribed.   Emesis      Home Medications Prior to Admission medications   Medication Sig Start Date End Date Taking? Authorizing Provider  calcium citrate-vitamin D 500-400 MG-UNIT chewable tablet 1 tab by mouth twice daily. Patient not taking: Reported on 08/16/2022 12/29/20   [name], MD  cholecalciferol (VITAMIN D3) 25 MCG (1000 UNIT) tablet Take 1,000 Units by mouth daily.    [provider]  cyclobenzaprine (FLEXERIL) 5 MG tablet Take 5 mg by mouth at bedtime as needed for muscle spasms. 06/30/22   [provider]  desmopressin (DDAVP) 0.1 MG tablet Take 0.5 tablets (0.05 mg total) by mouth at bedtime. 07/24/22   Thurnell Lose, MD  hydrocortisone (CORTEF) 10 MG tablet Take 1 tablet (10 mg total) by mouth 2 (two) times daily. Patient taking differently: Take 10 mg by mouth 2 (two) times daily. 1.5 tab by mouth in the morning, then .5 tab by mouth in the evening 07/24/22   Thurnell Lose, MD  hydrocortisone (PROCTOZONE-HC) 2.5 % rectal cream Place 1 Application rectally 2 (two) times daily. Patient not  taking: Reported on 08/24/2022 08/16/22   [name], MD  levothyroxine (SYNTHROID) 125 MCG tablet TAKE 1 TABLET BY MOUTH EVERY DAY BEFORE BREAKFAST Patient taking differently: Take 125 mcg by mouth daily before breakfast. 06/14/22   [name], MD  Magnesium 200 MG TABS Take 1 tablet by mouth daily.    [provider]  Potassium 99 MG TABS Take 1 tablet by mouth daily.    [provider]  topiramate (TOPAMAX) 25 MG tablet 1 tab by mouth at bedtime Patient taking differently: Take 25 mg by mouth at bedtime. 07/17/22   [name], MD  vitamin C (ASCORBIC ACID) 250 MG tablet Take 250 mg by mouth daily.    [provider]      Allergies    Patient has no known allergies.    Review of Systems   Review of Systems  Gastrointestinal:  Positive for vomiting.    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  Constitutional:      Appearance: She is well-developed.  HENT:     Head: Normocephalic and atraumatic.     Nose: No congestion or rhinorrhea.     Mouth/Throat:     Mouth: Mucous membranes are  dry.  Eyes:     Pupils: Pupils are equal, round, and reactive to light.  Cardiovascular:     Rate and Rhythm: Regular rhythm. Tachycardia present.  Pulmonary:     Effort: No respiratory distress.     Breath sounds: No stridor.  Abdominal:     General: Abdomen is flat. There is no distension.  Musculoskeletal:        General: No swelling or tenderness. Normal range of motion.     Cervical back: Normal range of motion.  Skin:    General: Skin is warm and dry.  Neurological:     General: No focal deficit present.     Mental Status: She is alert.     ED Results / Procedures /  Treatments   Labs (all labs ordered are listed, but only abnormal results are displayed) Labs Reviewed  BASIC METABOLIC PANEL - Abnormal; Notable for the following components:      Result Value   Potassium 3.0 (*)    Creatinine, Ser 1.03 (*)    All other components within normal limits  URINALYSIS, ROUTINE W REFLEX MICROSCOPIC - Abnormal; Notable for the following components:   Leukocytes,Ua TRACE (*)    All other components within normal limits  COMPREHENSIVE METABOLIC PANEL - Abnormal; Notable for the following components:   Creatinine, Ser 1.01 (*)    All other components within normal limits  TSH - Abnormal; Notable for the following components:   TSH <0.010 (*)    All other components within normal limits  T4, FREE - Abnormal; Notable for the following components:   Free T4 2.43 (*)    All other components within normal limits  MAGNESIUM - Abnormal; Notable for the following components:   Magnesium 1.6 (*)    All other components within normal limits  RESP PANEL BY RT-PCR (RSV, FLU A&B, COVID)  RVPGX2  CULTURE, BLOOD (ROUTINE X 2)  CULTURE, BLOOD (ROUTINE X 2)  CBC WITH DIFFERENTIAL/PLATELET  LACTIC ACID, PLASMA  LACTIC ACID, PLASMA  CBC WITH DIFFERENTIAL/PLATELET  PROTIME-INR  APTT  T3, FREE  T4  T3  I-STAT BETA HCG BLOOD, ED ([facility], [facility], [facility] ONLY)    EKG EKG Interpretation  Date/Time:  Friday September 07 2022 03:09:15 EST Ventricular Rate:  138 PR Interval:  145 QRS Duration: 85 QT Interval:  288 QTC Calculation: 437 R Axis:   107 Text Interpretation: Sinus tachycardia Borderline right axis deviation Confirmed by Merrily Pew [phone]) on 09/07/2022 4:32:50 AM  Radiology DG Chest Port 1 View  Result Date: 09/07/2022 CLINICAL DATA:  Headache, vomiting, blurred vision EXAM: PORTABLE CHEST 1 VIEW COMPARISON:  07/19/2022 FINDINGS: Lungs are clear.  No pleural effusion or pneumothorax. The heart is normal in size. IMPRESSION: No evidence of acute cardiopulmonary  disease. Electronically Signed   By: Julian Hy M.D.   On: 09/07/2022 03:16   CT Head Wo Contrast  Result Date: 09/06/2022 CLINICAL DATA:  Headache, increasing frequency or severity Known pituitary tumor, worsening headache, new onset emesis EXAM: CT HEAD WITHOUT CONTRAST TECHNIQUE: Contiguous axial images were obtained from the base of the skull through the vertex without intravenous contrast. RADIATION DOSE REDUCTION: This exam was performed according to the departmental dose-optimization program which includes automated exposure control, adjustment of the mA and/or kV according to patient size and/or use of iterative reconstruction technique. COMPARISON:  08/24/2022. FINDINGS: Brain: No evidence of acute infarction, hemorrhage, hydrocephalus, extra-axial collection or mass effect. Cystic appearing lesion with expansion of the sella is a stable  finding measuring about 1.8 cm. Vascular: No hyperdense vessel or unexpected calcification. Skull: Normal. Negative for fracture or focal lesion. Sinuses/Orbits: No acute finding. 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 No hydrocephalus. No mass effect or shift. Electronically Signed   By: Sammie Bench M.D.   On: 09/06/2022 18:00    Procedures .Critical Care  Performed by: Merrily Pew, MD Authorized by: Merrily Pew, MD   Critical care provider statement:    Critical care time (minutes):  30   Critical care was necessary to treat or prevent imminent or life-threatening deterioration of the following conditions:  Circulatory failure   Critical care was time spent personally by me on the following activities:  Development of treatment plan with patient or surrogate, discussions with consultants, evaluation of patient's response to treatment, examination of patient, ordering and review of laboratory studies, ordering and review of radiographic studies, ordering and performing  treatments and interventions, pulse oximetry, re-evaluation of patient's condition and review of old charts     Medications Ordered in ED Medications  hydrocortisone sodium succinate (SOLU-CORTEF) 100 MG injection 100 mg (has no administration in time range)  lactated ringers bolus 1,000 mL (1,000 mLs Intravenous New Bag/Given 09/07/22 0305)  acetaminophen (TYLENOL) tablet 1,000 mg (1,000 mg Oral Given 09/07/22 0305)  ondansetron (ZOFRAN) injection 4 mg (4 mg Intravenous Given 09/07/22 0304)  potassium chloride 10 mEq in 100 mL IVPB (0 mEq Intravenous Stopped 09/07/22 0600)  magnesium sulfate IVPB 2 g 50 mL (0 g Intravenous Stopped 09/07/22 0600)    ED Course/ Medical Decision Making/ A&P                           Medical Decision Making Amount and/or Complexity of Data Reviewed Labs: ordered. Radiology: ordered. ECG/medicine tests: ordered.  Risk OTC drugs. Prescription drug management. Decision regarding hospitalization.  Hyperthyroid? Septic? Unclear etiology at this point. Also consider adrenal? Persistently elevated heart rate however she has a history of Graves' status post thyroidectomy which explains her low TSH and her T4 is not significantly high.  Chest x-ray looks okay urine looks okay.  Considered LP however she had a similar presentation about a month ago where an LP was negative and she does not really have any neck pain or any other meningismus.  The headache seems more chronic in nature.  Multiple liters of fluid given in the ER along with Tylenol.  No source found for infection so no antibiotics started at this time.  Blood cultures are pending.  Urine cultures pending.  Discussed with hospitalist who reviewed the case and agrees for admission.   Final Clinical Impression(s) / ED Diagnoses Final diagnoses:  Nausea and vomiting, unspecified vomiting type    Rx / DC Orders ED Discharge Orders     None         Nadea Kirkland, Corene Cornea, MD 09/07/22 [id number]

## 2022-09-07 NOTE — ED Notes (Signed)
Dr. Tamala Julian at bedside, orders to be placed for pt's tachycardia.

## 2022-09-08 ENCOUNTER — Encounter (HOSPITAL_COMMUNITY): Payer: Self-pay | Admitting: Internal Medicine

## 2022-09-08 ENCOUNTER — Other Ambulatory Visit: Payer: Self-pay

## 2022-09-08 LAB — BASIC METABOLIC PANEL
Anion gap: 8 (ref 5–15)
BUN: 10 mg/dL (ref 6–20)
CO2: 21 mmol/L — ABNORMAL LOW (ref 22–32)
Calcium: 9.1 mg/dL (ref 8.9–10.3)
Chloride: 110 mmol/L (ref 98–111)
Creatinine, Ser: 0.78 mg/dL (ref 0.44–1.00)
GFR, Estimated: >60 mL/min (ref >60)
Glucose, Bld: 127 mg/dL — ABNORMAL HIGH (ref 70–99)
Potassium: 3.9 mmol/L (ref 3.5–5.1)
Sodium: 139 mmol/L (ref 135–145)

## 2022-09-08 LAB — MAGNESIUM: Magnesium: 1.9 mg/dL (ref 1.7–2.4)

## 2022-09-08 MED ORDER — METOPROLOL TARTRATE 12.5 MG HALF TABLET
12.5000 mg | ORAL_TABLET | Freq: Two times a day (BID) | ORAL | Status: DC
  Administered 2022-09-08 – 2022-09-10 (×5): 12.5 mg via ORAL
  Filled 2022-09-08 (×5): qty 1

## 2022-09-08 NOTE — Plan of Care (Signed)

## 2022-09-08 NOTE — Progress Notes (Signed)
PROGRESS NOTE    Sara Mathis  EVO:[id number] DOB: 1985/11/23 DOA: 09/06/2022 PCP: [name], MD   Brief Narrative:  HPI: Sara Mathis is a 36 y.o. female with medical history significant of migraine headaches and Graves' disease s/p total thyroidectomy who presents with complaints of nausea and headache.  Patient feels that her vision is blurry and possibly a little worse than prior.  Reports associated symptoms of chills, dizziness, nasal congestion, vomiting, diarrhea, and some abdominal discomfort.   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  Initially met SIRS criteria and have been admitted by PCCM due to agitation requiring Precedex drip.  There was concern for meningitis, but LP cultures were negative.  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  Neurosurgery had been consulted, but felt no acute need for surgical intervention and for patient to follow-up in outpatient setting.  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  She followed up with Dr. [name] on 11/22, and patient reports that she has been taking all of her medications as prescribed.   In the emergency department patient was seen to be febrile up to 101.2 F with tachycardia and tachypnea meeting SIRS criteria.  CT scan of the head showed enlarged sella with cystic appearing lesion in the pituitary region without interval change.  Labs significant for potassium 3->3.6, BUN-7->10, creatinine-1.03->1.01, magnesium-1.6, TSH- <0.01,  free T4-2.43, and lactic acid- 1.7->1.4.  Chest x-ray noted no acute abnormality.  Urinalysis noted trace leukocytes with no bacteria seen or  significant signs of infection. Blood and urine cultures were obtained.  No clear source of infection was appreciated and therefore antibiotics were not started.  Patient was given 1 L of lactated Ringer's, acetaminophen 1000 mg p.o., Zofran IV, potassium chloride 10 mEq IV, 2 g of magnesium sulfate, and hydrocortisone 100 mg IV.  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  Found to have fever up to 101.2 F with tachycardia and tachypnea meeting SIRS criteria.  WBC and lactic acid levels were within normal limits.  She had similar presentation last month where she was worked up with LP and evaluated by ID.  Question possible virus or GI source of symptoms.  ID consulted, she has been started on Rocephin and vancomycin empirically pending finalization of the culture reports.  She has not had any fever spike since admission.   Question iatrogenic thyrotoxicosis Acute.  Patient with history of Graves' disease status post total thyroidectomy on levothyroxine 125 mcg daily. Labs noted Free T4-2.43 with TSH- <0.01.  Admitting hospitalist tried to reach Dr. [name] who was out of the office and he discussed with his partner who agreed with need to hold levothyroxine for at least 1 week and decreasing dose.  Unclear that all of patient's symptoms could be caused by this. -Hold levothyroxine for at least 1 week due to the half-life patient was started on as needed metoprolol.  She is slightly tachycardic but  asymptomatic.  I will start her on scheduled Lopressor 12.5 mg p.o. twice daily.   Hypomagnesemia Replaced and resolved.   Pituitary apoplexy with  cystic pituitary lesion developing panhypopituitary is him Patient reports continued blurry vision and headaches thought secondary to above.  Had been seen by neurosurgery during last admission and recommended to follow-up in outpatient setting is reported no acute need for surgical intervention at that time.  CT scan of the brain did not note any acute change in the cystic pituitary lesion.  Patient has been started on hydrocortisone/stress dose for 3 to 5 days.   Central diabetes insipidus Evaluated by nephrology during last hospitalization and placed on desmopressin. -Continue desmopressin  AKI: Patient's baseline hemoglobin appears to be around 0.7, she presented 1.03, she was hydrated, now down to 0.78.  DVT prophylaxis: SCDs Start: 09/07/22 0731   Code Status: Full Code  Family Communication:  None present at bedside.  Plan of care discussed with patient in length and he/she verbalized understanding and agreed with it.  Status is: Inpatient Remains inpatient appropriate because: Patient needs further monitoring for possible occult infection.   Estimated body mass index is 25.99 kg/m as calculated from the following:   Height as of 08/16/22: _0  (1.676 m).   Weight as of 08/16/22: 73 kg.    Nutritional Assessment: There is no height or weight on file to calculate BMI.. Seen by dietician.  I agree with the assessment and plan as outlined below: Nutrition Status:        . Skin Assessment: I have examined the patient's skin and I agree with the wound assessment as performed by the wound care RN as outlined below:    Consultants:  ID  Procedures:  None  Antimicrobials:  Anti-infectives (From admission, onward)    Start     Dose/Rate Route Frequency Ordered Stop   09/07/22 1245  cefTRIAXone (ROCEPHIN) 2 g in sodium chloride 0.9 % 100 mL IVPB        2 g 200 mL/hr over 30 Minutes Intravenous Every 24 hours 09/07/22 1232     09/07/22 1245  vancomycin (VANCOREADY) IVPB  1500 mg/300 mL        1,500 mg 150 mL/hr over 120 Minutes Intravenous Every 24 hours 09/07/22 1232           Subjective: Patient and examined.  She complains of some blurry vision and headache but symptoms are improving compared to yesterday.  No other complaint.  She is afebrile.  Objective: Vitals:   09/07/22 1609 09/07/22 1927 09/08/22 0326 09/08/22 0809  BP: 124/89 106/78 119/77 104/72  Pulse: 97 (!) 113 89 (!) 108  Resp: (!) _1 Temp: 98 F (36.7 C) 98.1 F (36.7 C) 98 F (36.7 C)   TempSrc: Oral Oral Oral   SpO2: 98% 100% 98% 98%    Intake/Output Summary (Last 24 hours) at 09/08/2022 1132 Last data filed at 09/08/2022 0900 Gross per 24 hour  Intake 340 ml  Output --  Net 340 ml   There were no vitals filed for this visit.  Examination:  General exam: Appears calm and comfortable  Respiratory system: Clear to auscultation. Respiratory effort normal. Cardiovascular system: S1 & S2 heard, RRR. No JVD, murmurs, rubs, gallops or clicks. No pedal edema. Gastrointestinal system: Abdomen is nondistended, soft and nontender. No organomegaly or masses felt. Normal bowel sounds heard. Central nervous system: Alert and oriented. No focal neurological deficits. Extremities: Symmetric 5 x 5 power. Skin:  No rashes, lesions or ulcers Psychiatry: Judgement and insight appear normal. Mood & affect appropriate.    Data Reviewed: I have personally reviewed following labs and imaging studies  CBC: Recent Labs  Lab 09/06/22 1630 09/07/22 0250  WBC 6.5 7.0  NEUTROABS 3.5 3.6  HGB 13.4 13.9  HCT 41.6 42.2  MCV 91.0 90.8  PLT 224 932   Basic Metabolic Panel: Recent Labs  Lab 09/06/22 1630 09/07/22 0250 09/07/22 0500 09/08/22 0245  NA 137 135  --  139  K 3.0* 3.6  --  3.9  CL 104 103  --  110  CO2 24 22  --  21*  GLUCOSE 96 82  --  127*  BUN 7 10  --  10  CREATININE 1.03* 1.01*  --  0.78  CALCIUM 9.2 9.0  --  9.1  MG  --   --  1.6* 1.9   GFR: CrCl  cannot be calculated (Unknown ideal weight.). Liver Function Tests: Recent Labs  Lab 09/07/22 0250  AST 20  ALT 15  ALKPHOS 39  BILITOT 1.1  PROT 6.7  ALBUMIN 3.6   No results for input(s): "LIPASE", "AMYLASE" in the last 168 hours. No results for input(s): "AMMONIA" in the last 168 hours. Coagulation Profile: Recent Labs  Lab 09/07/22 0250  INR 1.1   Cardiac Enzymes: No results for input(s): "CKTOTAL", "CKMB", "CKMBINDEX", "TROPONINI" in the last 168 hours. BNP (last 3 results) No results for input(s): "PROBNP" in the last 8760 hours. HbA1C: No results for input(s): "HGBA1C" in the last 72 hours. CBG: No results for input(s): "GLUCAP" in the last 168 hours. Lipid Profile: No results for input(s): "CHOL", "HDL", "LDLCALC", "TRIG", "CHOLHDL", "LDLDIRECT" in the last 72 hours. Thyroid Function Tests: Recent Labs    09/07/22 0250 09/07/22 0500  TSH <0.010*  --   FREET4  --  2.43*   Anemia Panel: No results for input(s): "VITAMINB12", "FOLATE", "FERRITIN", "TIBC", "IRON", "RETICCTPCT" in the last 72 hours. Sepsis Labs: Recent Labs  Lab 09/07/22 0250 09/07/22 0500  LATICACIDVEN 1.7 1.4    Recent Results (from the past 240 hour(s))  Resp panel by RT-PCR (RSV, Flu A&B, Covid) Anterior Nasal Swab     Status: None   Collection Time: 09/06/22  3:56 PM   Specimen: Anterior Nasal Swab  Result Value Ref Range Status   SARS Coronavirus 2 by RT PCR NEGATIVE NEGATIVE Final    Comment: (NOTE) SARS-CoV-2 target nucleic acids are NOT DETECTED.  The SARS-CoV-2 RNA is generally detectable in upper respiratory specimens during the acute phase of infection. The lowest concentration of SARS-CoV-2 viral copies this assay can detect is 138 copies/mL. A negative result does not preclude SARS-Cov-2 infection and should not be used as the sole basis for treatment or other patient management decisions. A negative result may occur with  improper specimen collection/handling,  submission of specimen other than nasopharyngeal swab, presence of viral mutation(s) within the areas targeted by this assay, and inadequate number of viral copies(<138 copies/mL). A negative result must be combined with clinical observations, patient history, and epidemiological information. The expected result is Negative.  Fact Sheet for Patients:  EntrepreneurPulse.com.au  Fact Sheet for Healthcare Providers:  IncredibleEmployment.be  This test is no t yet approved or cleared by the [place] FDA and  has been authorized for detection and/or diagnosis of SARS-CoV-2 by FDA under an Emergency Use Authorization (EUA). This EUA will remain  in effect (meaning this test can be used) for the duration of  the COVID-19 declaration under Section 564(b)(1) of the Act, 21 U.S.C.section 360bbb-3(b)(1), unless the authorization is terminated  or revoked sooner.       Influenza A by PCR NEGATIVE NEGATIVE Final   Influenza B by PCR NEGATIVE NEGATIVE Final    Comment: (NOTE) The Xpert Xpress SARS-CoV-2/FLU/RSV plus assay is intended as an aid in the diagnosis of influenza from Nasopharyngeal swab specimens and should not be used as a sole basis for treatment. Nasal washings and aspirates are unacceptable for Xpert Xpress SARS-CoV-2/FLU/RSV testing.  Fact Sheet for Patients: EntrepreneurPulse.com.au  Fact Sheet for Healthcare Providers: IncredibleEmployment.be  This test is not yet approved or cleared by the [place] FDA and has been authorized for detection and/or diagnosis of SARS-CoV-2 by FDA under an Emergency Use Authorization (EUA). This EUA will remain in effect (meaning this test can be used) for the duration of the COVID-19 declaration under Section 564(b)(1) of the Act, 21 U.S.C. section 360bbb-3(b)(1), unless the authorization is terminated or revoked.     Resp Syncytial Virus by PCR NEGATIVE  NEGATIVE Final    Comment: (NOTE) Fact Sheet for Patients: EntrepreneurPulse.com.au  Fact Sheet for Healthcare Providers: IncredibleEmployment.be  This test is not yet approved or cleared by the [place] FDA and has been authorized for detection and/or diagnosis of SARS-CoV-2 by FDA under an Emergency Use Authorization (EUA). This EUA will remain in effect (meaning this test can be used) for the duration of the COVID-19 declaration under Section 564(b)(1) of the Act, 21 U.S.C. section 360bbb-3(b)(1), unless the authorization is terminated or revoked.  Performed at [facility], [place] [address], [place], [place] [address]   Blood Culture (routine x 2)     Status: None (Preliminary result)   Collection Time: 09/07/22  2:49 AM   Specimen: BLOOD  Result Value Ref Range Status   Specimen Description BLOOD LEFT ANTECUBITAL  Final   Special Requests   Final    BOTTLES DRAWN AEROBIC AND ANAEROBIC Blood Culture results may not be optimal due to an inadequate volume of blood received in culture bottles   Culture   Final    NO GROWTH < 24 HOURS Performed at [facility], [place] [address], [place], [place] [address]    Report Status PENDING  Incomplete  Blood Culture (routine x 2)     Status: None (Preliminary result)   Collection Time: 09/07/22  2:50 AM   Specimen: BLOOD  Result Value Ref Range Status   Specimen Description BLOOD RIGHT ANTECUBITAL  Final   Special Requests   Final    BOTTLES DRAWN AEROBIC AND ANAEROBIC Blood Culture adequate volume   Culture   Final    NO GROWTH 1 DAY Performed at [facility], [place] [address] [place], [place] [address]    Report Status PENDING  Incomplete  Respiratory (~20 pathogens) panel by PCR     Status: None   Collection Time: 09/07/22 12:56 PM   Specimen: Nasopharyngeal Swab; Respiratory  Result Value Ref Range Status   Adenovirus NOT DETECTED NOT DETECTED Final   Coronavirus 229E NOT  DETECTED NOT DETECTED Final    Comment: (NOTE) The Coronavirus on the Respiratory Panel, DOES NOT test for the novel  Coronavirus (2019 nCoV)    Coronavirus HKU1 NOT DETECTED NOT DETECTED Final   Coronavirus NL63 NOT DETECTED NOT DETECTED Final   Coronavirus OC43 NOT DETECTED NOT DETECTED Final   Metapneumovirus NOT DETECTED NOT DETECTED Final   Rhinovirus / Enterovirus NOT  DETECTED NOT DETECTED Final   Influenza A NOT DETECTED NOT DETECTED Final   Influenza B NOT DETECTED NOT DETECTED Final   Parainfluenza Virus 1 NOT DETECTED NOT DETECTED Final   Parainfluenza Virus 2 NOT DETECTED NOT DETECTED Final   Parainfluenza Virus 3 NOT DETECTED NOT DETECTED Final   Parainfluenza Virus 4 NOT DETECTED NOT DETECTED Final   Respiratory Syncytial Virus NOT DETECTED NOT DETECTED Final   Bordetella pertussis NOT DETECTED NOT DETECTED Final   Bordetella Parapertussis NOT DETECTED NOT DETECTED Final   Chlamydophila pneumoniae NOT DETECTED NOT DETECTED Final   Mycoplasma pneumoniae NOT DETECTED NOT DETECTED Final    Comment: Performed at [facility], [place] [address] [place], [place] [address]     Radiology Studies: DG Chest [place] 1 View  Result Date: 09/07/2022 CLINICAL DATA:  Headache, vomiting, blurred vision EXAM: PORTABLE CHEST 1 VIEW COMPARISON:  07/19/2022 FINDINGS: Lungs are clear.  No pleural effusion or pneumothorax. The heart is normal in size. IMPRESSION: No evidence of acute cardiopulmonary disease. Electronically Signed   By: Julian Hy M.D.   On: 09/07/2022 03:16   CT Head Wo Contrast  Result Date: 09/06/2022 CLINICAL DATA:  Headache, increasing frequency or severity Known pituitary tumor, worsening headache, new onset emesis EXAM: CT HEAD WITHOUT CONTRAST TECHNIQUE: Contiguous axial images were obtained from the base of the skull through the vertex without intravenous contrast. RADIATION DOSE REDUCTION: This exam was performed according to the departmental  dose-optimization program which includes automated exposure control, adjustment of the mA and/or kV according to patient size and/or use of iterative reconstruction technique. COMPARISON:  08/24/2022. FINDINGS: Brain: No evidence of acute infarction, hemorrhage, hydrocephalus, extra-axial collection or mass effect. Cystic appearing lesion with expansion of the sella is a stable finding measuring about 1.8 cm. Vascular: No hyperdense vessel or unexpected calcification. Skull: Normal. Negative for fracture or focal lesion. Sinuses/Orbits: No acute finding. 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 No hydrocephalus. No mass effect or shift. Electronically Signed   By: Sammie Bench M.D.   On: 09/06/2022 18:00    Scheduled Meds:  desmopressin  0.1 mg Oral QHS   hydrocortisone sod succinate (SOLU-CORTEF) inj  100 mg Intravenous Q8H   metoprolol tartrate  12.5 mg Oral BID   pantoprazole (PROTONIX) IV  40 mg Intravenous Q24H   sodium chloride flush  3 mL Intravenous Q12H   topiramate  25 mg Oral QHS   Continuous Infusions:  sodium chloride 100 mL/hr at 09/08/22 0325   cefTRIAXone (ROCEPHIN)  IV Stopped (09/07/22 1518)   vancomycin 1,500 mg (09/07/22 1622)     LOS: 1 day   Darliss Cheney, MD [facility]  09/08/2022, 11:32 AM   *Please note that this is a verbal dictation therefore any spelling or grammatical errors are due to the "[place]" system interpretation.  Please page via [place] and do not message via secure chat for urgent patient care matters. Secure chat can be used for non urgent patient care matters.  How to contact the [facility] Attending or Consulting provider [place] or covering provider during after hours [place], for this patient?  Check the care team in [facility] and look for a) attending/consulting [facility] provider listed and b) the [facility] team listed. Page or secure chat [phone]. Log into [email or web address] and use [facility]'s universal password to access. If you do not have the password, please contact the hospital operator. Locate the  [place] provider you are looking for under [facility] and page to a number that you can be directly reached. If you still have difficulty reaching the provider, please page the [facility] (Director on Call) for the Hospitalists listed on amion for assistance.

## 2022-09-09 LAB — CBC WITH DIFFERENTIAL/PLATELET
Abs Immature Granulocytes: 0.02 10*3/uL (ref 0.00–0.07)
Basophils Absolute: 0 10*3/uL (ref 0.0–0.1)
Basophils Relative: 0 %
Eosinophils Absolute: 0 10*3/uL (ref 0.0–0.5)
Eosinophils Relative: 0 %
HCT: 28.9 % — ABNORMAL LOW (ref 36.0–46.0)
Hemoglobin: 9.6 g/dL — ABNORMAL LOW (ref 12.0–15.0)
Immature Granulocytes: 0 %
Lymphocytes Relative: 17 %
Lymphs Abs: 1 10*3/uL (ref 0.7–4.0)
MCH: 29.7 pg (ref 26.0–34.0)
MCHC: 33.2 g/dL (ref 30.0–36.0)
MCV: 89.5 fL (ref 80.0–100.0)
Monocytes Absolute: 0.3 10*3/uL (ref 0.1–1.0)
Monocytes Relative: 5 %
Neutro Abs: 4.3 10*3/uL (ref 1.7–7.7)
Neutrophils Relative %: 78 %
Platelets: 125 10*3/uL — ABNORMAL LOW (ref 150–400)
RBC: 3.23 MIL/uL — ABNORMAL LOW (ref 3.87–5.11)
RDW: 11.9 % (ref 11.5–15.5)
WBC: 5.6 10*3/uL (ref 4.0–10.5)
nRBC: 0 % (ref 0.0–0.2)

## 2022-09-09 LAB — COMPREHENSIVE METABOLIC PANEL
ALT: 13 U/L (ref 0–44)
AST: 15 U/L (ref 15–41)
Albumin: 2.6 g/dL — ABNORMAL LOW (ref 3.5–5.0)
Alkaline Phosphatase: 30 U/L — ABNORMAL LOW (ref 38–126)
Anion gap: 7 (ref 5–15)
BUN: 9 mg/dL (ref 6–20)
CO2: 20 mmol/L — ABNORMAL LOW (ref 22–32)
Calcium: 8.3 mg/dL — ABNORMAL LOW (ref 8.9–10.3)
Chloride: 111 mmol/L (ref 98–111)
Creatinine, Ser: 0.59 mg/dL (ref 0.44–1.00)
GFR, Estimated: >60 mL/min (ref >60)
Glucose, Bld: 108 mg/dL — ABNORMAL HIGH (ref 70–99)
Potassium: 3 mmol/L — ABNORMAL LOW (ref 3.5–5.1)
Sodium: 138 mmol/L (ref 135–145)
Total Bilirubin: 0.6 mg/dL (ref 0.3–1.2)
Total Protein: 5 g/dL — ABNORMAL LOW (ref 6.5–8.1)

## 2022-09-09 LAB — MAGNESIUM: Magnesium: 1.6 mg/dL — ABNORMAL LOW (ref 1.7–2.4)

## 2022-09-09 MED ORDER — POTASSIUM CHLORIDE CRYS ER 20 MEQ PO TBCR
40.0000 meq | EXTENDED_RELEASE_TABLET | ORAL | Status: AC
  Administered 2022-09-09 (×2): 40 meq via ORAL
  Filled 2022-09-09 (×2): qty 2

## 2022-09-09 MED ORDER — MAGNESIUM SULFATE 2 GM/50ML IV SOLN
2.0000 g | Freq: Once | INTRAVENOUS | Status: AC
  Administered 2022-09-09: 2 g via INTRAVENOUS
  Filled 2022-09-09: qty 50

## 2022-09-09 NOTE — Progress Notes (Signed)
ID PROGRESS NOTE  Remains afebrile, all micro data is negative  Recommend to stop abtx and observe off of abtx.  Will sign off.  Elzie Rings [place] [facility] [phone]

## 2022-09-09 NOTE — Progress Notes (Signed)
PROGRESS NOTE    Sara Mathis  PVV:[id number] DOB: 1985-10-06 DOA: 09/06/2022 PCP: [name], MD   Brief Narrative:  HPI: Sara Mathis is a 36 y.o. female with medical history significant of migraine headaches and Graves' disease s/p total thyroidectomy who presents with complaints of nausea and headache.  Patient feels that her vision is blurry and possibly a little worse than prior.  Reports associated symptoms of chills, dizziness, nasal congestion, vomiting, diarrhea, and some abdominal discomfort.   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  Initially met SIRS criteria and have been admitted by PCCM due to agitation requiring Precedex drip.  There was concern for meningitis, but LP cultures were negative.  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  Neurosurgery had been consulted, but felt no acute need for surgical intervention and for patient to follow-up in outpatient setting.  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  She followed up with Dr. [name] on 11/22, and patient reports that she has been taking all of her medications as prescribed.   In the emergency department patient was seen to be febrile up to 101.2 F with tachycardia and tachypnea meeting SIRS criteria.  CT scan of the head showed enlarged sella with cystic appearing lesion in the pituitary region without interval change.  Labs significant for potassium 3->3.6, BUN-7->10, creatinine-1.03->1.01, magnesium-1.6, TSH- <0.01,  free T4-2.43, and lactic acid- 1.7->1.4.  Chest x-ray noted no acute abnormality.  Urinalysis noted trace leukocytes with no bacteria seen or  significant signs of infection. Blood and urine cultures were obtained.  No clear source of infection was appreciated and therefore antibiotics were not started.  Patient was given 1 L of lactated Ringer's, acetaminophen 1000 mg p.o., Zofran IV, potassium chloride 10 mEq IV, 2 g of magnesium sulfate, and hydrocortisone 100 mg IV.  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  Found to have fever up to 101.2 F with tachycardia and tachypnea meeting SIRS criteria.  WBC and lactic acid levels were within normal limits.  She had similar presentation last month where she was worked up with LP and evaluated by ID.  Question possible virus or GI source of symptoms.  ID consulted, she has been started on Rocephin and vancomycin empirically pending finalization of the culture reports.  She has not had any fever spike since admission.  Awaiting further clarification of plans by ID.   Question iatrogenic thyrotoxicosis Acute.  Patient with history of Graves' disease status post total thyroidectomy on levothyroxine 125 mcg daily. Labs noted Free T4-2.43 with TSH- <0.01.  Admitting hospitalist tried to reach Dr. [name] who was out of the office and he discussed with his partner who agreed with need to hold levothyroxine for at least 1 week and decreasing dose.  Unclear that all of patient's symptoms could be caused by this. -Hold levothyroxine for at least 1 week due to the half-life.  Tachycardia improved after starting  on metoprolol 12.5 mg p.o. twice daily.   Hypomagnesemia Low again.  Will replace.  Hypomagnesemia: Will replace.   Pituitary apoplexy with  cystic pituitary lesion developing panhypopituitary is him Patient reports continued blurry vision and headaches thought secondary to above.  Had been seen by neurosurgery during last admission and recommended to follow-up in outpatient setting is reported no acute need for surgical intervention at that time.  CT scan of the brain did not note any acute change in the cystic pituitary lesion.  Patient has been started on hydrocortisone/stress dose for 3 to 5 days.   Central diabetes insipidus Evaluated by nephrology during last hospitalization and placed on desmopressin. -Continue desmopressin  AKI: Patient's baseline hemoglobin appears to be around 0.7, she presented 1.03, she was hydrated, now down to 0.78.  DVT prophylaxis: SCDs Start: 09/07/22 0731   Code Status: Full Code  Family Communication:  None present at bedside.  Plan of care discussed with patient in length and he/she verbalized understanding and agreed with it.  Status is: Inpatient Remains inpatient appropriate because: Patient needs further monitoring for possible occult infection.   Estimated body mass index is 25.98 kg/m as calculated from the following:   Height as of this encounter: _0  (1.676 m).   Weight as of this encounter: 73 kg.    Nutritional Assessment: Body mass index is 25.98 kg/m.Marland Kitchen Seen by dietician.  I agree with the assessment and plan as outlined below: Nutrition Status:        . Skin Assessment: I have examined the patient's skin and I agree with the wound assessment as performed by the wound care RN as outlined below:    Consultants:  ID  Procedures:  None  Antimicrobials:  Anti-infectives (From admission, onward)    Start     Dose/Rate Route Frequency Ordered Stop   09/07/22 1245  cefTRIAXone (ROCEPHIN) 2 g in sodium chloride 0.9 % 100 mL IVPB        2 g 200 mL/hr over 30 Minutes Intravenous Every 24 hours 09/07/22 1232     09/07/22 1245  vancomycin (VANCOREADY) IVPB 1500 mg/300  mL        1,500 mg 150 mL/hr over 120 Minutes Intravenous Every 24 hours 09/07/22 1232           Subjective:  Patient seen and examined.  She comes some mild headache.  Blurry vision has improved as well.  Her significant other at the bedside.  Objective: Vitals:   09/08/22 2110 09/08/22 2111 09/08/22 2112 09/08/22 2118  BP:    115/77  Pulse: 79 82 85 87  Resp: 17 (!) 21 (!) 21 (!) 24  Temp:      TempSrc:      SpO2: 100% 100% 100% 98%  Weight:      Height:        Intake/Output Summary (Last 24 hours) at 09/09/2022 1110 Last data filed at 09/08/2022 1300 Gross per 24 hour  Intake 240 ml  Output --  Net 240 ml    Filed Weights   09/08/22 0809  Weight: 73 kg    Examination:  General exam: Appears calm and comfortable  Respiratory system: Clear to auscultation. Respiratory effort normal. Cardiovascular system: S1 & S2 heard, RRR. No JVD, murmurs, rubs, gallops or clicks. No pedal edema. Gastrointestinal system: Abdomen is nondistended, soft and nontender. No organomegaly or masses felt. Normal bowel sounds heard. Central nervous system: Alert and oriented. No focal neurological deficits. Extremities: Symmetric 5 x 5  power. Skin: No rashes, lesions or ulcers.  Psychiatry: Judgement and insight appear normal. Mood & affect appropriate.   Data Reviewed: I have personally reviewed following labs and imaging studies  CBC: Recent Labs  Lab 09/06/22 1630 09/07/22 0250 09/09/22 0313  WBC 6.5 7.0 5.6  NEUTROABS 3.5 3.6 4.3  HGB 13.4 13.9 9.6*  HCT 41.6 42.2 28.9*  MCV 91.0 90.8 89.5  PLT 224 172 125*    Basic Metabolic Panel: Recent Labs  Lab 09/06/22 1630 09/07/22 0250 09/07/22 0500 09/08/22 0245 09/09/22 0313  NA 137 135  --  139 138  K 3.0* 3.6  --  3.9 3.0*  CL 104 103  --  110 111  CO2 24 22  --  21* 20*  GLUCOSE 96 82  --  127* 108*  BUN 7 10  --  10 9  CREATININE 1.03* 1.01*  --  0.78 0.59  CALCIUM 9.2 9.0  --  9.1 8.3*  MG  --   --  1.6* 1.9  1.6*    GFR: Estimated Creatinine Clearance: 97.5 mL/min (by C-G formula based on SCr of 0.59 mg/dL). Liver Function Tests: Recent Labs  Lab 09/07/22 0250 09/09/22 0313  AST 20 15  ALT 15 13  ALKPHOS 39 30*  BILITOT 1.1 0.6  PROT 6.7 5.0*  ALBUMIN 3.6 2.6*    No results for input(s): "LIPASE", "AMYLASE" in the last 168 hours. No results for input(s): "AMMONIA" in the last 168 hours. Coagulation Profile: Recent Labs  Lab 09/07/22 0250  INR 1.1    Cardiac Enzymes: No results for input(s): "CKTOTAL", "CKMB", "CKMBINDEX", "TROPONINI" in the last 168 hours. BNP (last 3 results) No results for input(s): "PROBNP" in the last 8760 hours. HbA1C: No results for input(s): "HGBA1C" in the last 72 hours. CBG: No results for input(s): "GLUCAP" in the last 168 hours. Lipid Profile: No results for input(s): "CHOL", "HDL", "LDLCALC", "TRIG", "CHOLHDL", "LDLDIRECT" in the last 72 hours. Thyroid Function Tests: Recent Labs    09/07/22 0250 09/07/22 0500  TSH <0.010*  --   FREET4  --  2.43*    Anemia Panel: No results for input(s): "VITAMINB12", "FOLATE", "FERRITIN", "TIBC", "IRON", "RETICCTPCT" in the last 72 hours. Sepsis Labs: Recent Labs  Lab 09/07/22 0250 09/07/22 0500  LATICACIDVEN 1.7 1.4     Recent Results (from the past 240 hour(s))  Resp panel by RT-PCR (RSV, Flu A&B, Covid) Anterior Nasal Swab     Status: None   Collection Time: 09/06/22  3:56 PM   Specimen: Anterior Nasal Swab  Result Value Ref Range Status   SARS Coronavirus 2 by RT PCR NEGATIVE NEGATIVE Final    Comment: (NOTE) SARS-CoV-2 target nucleic acids are NOT DETECTED.  The SARS-CoV-2 RNA is generally detectable in upper respiratory specimens during the acute phase of infection. The lowest concentration of SARS-CoV-2 viral copies this assay can detect is 138 copies/mL. A negative result does not preclude SARS-Cov-2 infection and should not be used as the sole basis for treatment or other  patient management decisions. A negative result may occur with  improper specimen collection/handling, submission of specimen other than nasopharyngeal swab, presence of viral mutation(s) within the areas targeted by this assay, and inadequate number of viral copies(<138 copies/mL). A negative result must be combined with clinical observations, patient history, and epidemiological information. The expected result is Negative.  Fact Sheet for Patients:  EntrepreneurPulse.com.au  Fact Sheet for Healthcare Providers:  IncredibleEmployment.be  This test is no t yet approved or  cleared by the [place] and  has been authorized for detection and/or diagnosis of SARS-CoV-2 by FDA under an Emergency Use Authorization (EUA). This EUA will remain  in effect (meaning this test can be used) for the duration of the COVID-19 declaration under Section 564(b)(1) of the Act, 21 U.S.C.section 360bbb-3(b)(1), unless the authorization is terminated  or revoked sooner.       Influenza A by PCR NEGATIVE NEGATIVE Final   Influenza B by PCR NEGATIVE NEGATIVE Final    Comment: (NOTE) The Xpert Xpress SARS-CoV-2/FLU/RSV plus assay is intended as an aid in the diagnosis of influenza from Nasopharyngeal swab specimens and should not be used as a sole basis for treatment. Nasal washings and aspirates are unacceptable for Xpert Xpress SARS-CoV-2/FLU/RSV testing.  Fact Sheet for Patients: EntrepreneurPulse.com.au  Fact Sheet for Healthcare Providers: IncredibleEmployment.be  This test is not yet approved or cleared by the [place] FDA and has been authorized for detection and/or diagnosis of SARS-CoV-2 by FDA under an Emergency Use Authorization (EUA). This EUA will remain in effect (meaning this test can be used) for the duration of the COVID-19 declaration under Section 564(b)(1) of the Act, 21 U.S.C. section  360bbb-3(b)(1), unless the authorization is terminated or revoked.     Resp Syncytial Virus by PCR NEGATIVE NEGATIVE Final    Comment: (NOTE) Fact Sheet for Patients: EntrepreneurPulse.com.au  Fact Sheet for Healthcare Providers: IncredibleEmployment.be  This test is not yet approved or cleared by the [place] FDA and has been authorized for detection and/or diagnosis of SARS-CoV-2 by FDA under an Emergency Use Authorization (EUA). This EUA will remain in effect (meaning this test can be used) for the duration of the COVID-19 declaration under Section 564(b)(1) of the Act, 21 U.S.C. section 360bbb-3(b)(1), unless the authorization is terminated or revoked.  Performed at [facility], [place] [address], [place], [place] [address]   Blood Culture (routine x 2)     Status: None (Preliminary result)   Collection Time: 09/07/22  2:49 AM   Specimen: BLOOD  Result Value Ref Range Status   Specimen Description BLOOD LEFT ANTECUBITAL  Final   Special Requests   Final    BOTTLES DRAWN AEROBIC AND ANAEROBIC Blood Culture results may not be optimal due to an inadequate volume of blood received in culture bottles   Culture   Final    NO GROWTH 2 DAYS Performed at [facility], [place] [address] [place], [place] [address]    Report Status PENDING  Incomplete  Blood Culture (routine x 2)     Status: None (Preliminary result)   Collection Time: 09/07/22  2:50 AM   Specimen: BLOOD  Result Value Ref Range Status   Specimen Description BLOOD RIGHT ANTECUBITAL  Final   Special Requests   Final    BOTTLES DRAWN AEROBIC AND ANAEROBIC Blood Culture adequate volume   Culture   Final    NO GROWTH 2 DAYS Performed at [facility], [place] [address], [place], [place] [address]    Report Status PENDING  Incomplete  Respiratory (~20 pathogens) panel by PCR     Status: None   Collection Time: 09/07/22 12:56 PM   Specimen: Nasopharyngeal Swab;  Respiratory  Result Value Ref Range Status   Adenovirus NOT DETECTED NOT DETECTED Final   Coronavirus 229E NOT DETECTED NOT DETECTED Final    Comment: (NOTE) The Coronavirus on the Respiratory Panel, DOES NOT test for the novel  Coronavirus (2019 nCoV)  Coronavirus HKU1 NOT DETECTED NOT DETECTED Final   Coronavirus NL63 NOT DETECTED NOT DETECTED Final   Coronavirus OC43 NOT DETECTED NOT DETECTED Final   Metapneumovirus NOT DETECTED NOT DETECTED Final   Rhinovirus / Enterovirus NOT DETECTED NOT DETECTED Final   Influenza A NOT DETECTED NOT DETECTED Final   Influenza B NOT DETECTED NOT DETECTED Final   Parainfluenza Virus 1 NOT DETECTED NOT DETECTED Final   Parainfluenza Virus 2 NOT DETECTED NOT DETECTED Final   Parainfluenza Virus 3 NOT DETECTED NOT DETECTED Final   Parainfluenza Virus 4 NOT DETECTED NOT DETECTED Final   Respiratory Syncytial Virus NOT DETECTED NOT DETECTED Final   Bordetella pertussis NOT DETECTED NOT DETECTED Final   Bordetella Parapertussis NOT DETECTED NOT DETECTED Final   Chlamydophila pneumoniae NOT DETECTED NOT DETECTED Final   Mycoplasma pneumoniae NOT DETECTED NOT DETECTED Final    Comment: Performed at [facility], [place] [address] [place], [place] [address]     Radiology Studies: No results found.  Scheduled Meds:  desmopressin  0.1 mg Oral QHS   hydrocortisone sod succinate (SOLU-CORTEF) inj  100 mg Intravenous Q8H   metoprolol tartrate  12.5 mg Oral BID   pantoprazole (PROTONIX) IV  40 mg Intravenous Q24H   potassium chloride  40 mEq Oral Q4H   sodium chloride flush  3 mL Intravenous Q12H   topiramate  25 mg Oral QHS   Continuous Infusions:  sodium chloride 100 mL/hr at 09/09/22 0552   cefTRIAXone (ROCEPHIN)  IV 2 g (09/08/22 1638)   magnesium sulfate bolus IVPB     vancomycin 1,500 mg (09/08/22 1349)     LOS: 2 days   Darliss Cheney, MD [facility]  09/09/2022, 11:10 AM   *Please note that this is a verbal dictation  therefore any spelling or grammatical errors are due to the "[place] One" system interpretation.  Please page via [place] and do not message via secure chat for urgent patient care matters. Secure chat can be used for non urgent patient care matters.  How to contact the [facility] Attending or Consulting provider [place] or covering provider during after hours [place], for this patient?  Check the care team in [facility] and look for a) attending/consulting TRH provider listed and b) the [facility] team listed. Page or secure chat [phone]. Log into [email or web address] and use [place]'s universal password to access. If you do not have the password, please contact the hospital operator. Locate the [facility] provider you are looking for under [facility] and page to a number that you can be directly reached. If you still have difficulty reaching the provider, please page the [facility] (Director on Call) for the Hospitalists listed on amion for assistance.

## 2022-09-09 NOTE — Plan of Care (Signed)

## 2022-09-10 LAB — MAGNESIUM: Magnesium: 1.9 mg/dL (ref 1.7–2.4)

## 2022-09-10 LAB — BASIC METABOLIC PANEL
Anion gap: 8 (ref 5–15)
BUN: 9 mg/dL (ref 6–20)
CO2: 21 mmol/L — ABNORMAL LOW (ref 22–32)
Calcium: 8.8 mg/dL — ABNORMAL LOW (ref 8.9–10.3)
Chloride: 115 mmol/L — ABNORMAL HIGH (ref 98–111)
Creatinine, Ser: 0.71 mg/dL (ref 0.44–1.00)
GFR, Estimated: >60 mL/min (ref >60)
Glucose, Bld: 113 mg/dL — ABNORMAL HIGH (ref 70–99)
Potassium: 3.2 mmol/L — ABNORMAL LOW (ref 3.5–5.1)
Sodium: 144 mmol/L (ref 135–145)

## 2022-09-10 LAB — T4: T4, Total: 22.2 ug/dL — ABNORMAL HIGH (ref 4.5–12.0)

## 2022-09-10 LAB — T3, FREE: T3, Free: 5.7 pg/mL — ABNORMAL HIGH (ref 2.0–4.4)

## 2022-09-10 LAB — T3: T3, Total: 192 ng/dL — ABNORMAL HIGH (ref 71–180)

## 2022-09-10 MED ORDER — POTASSIUM CHLORIDE CRYS ER 20 MEQ PO TBCR
40.0000 meq | EXTENDED_RELEASE_TABLET | ORAL | Status: AC
  Administered 2022-09-10 (×2): 40 meq via ORAL
  Filled 2022-09-10 (×2): qty 2

## 2022-09-10 MED ORDER — ONDANSETRON 4 MG PO TBDP: 4.0000 mg | ORAL_TABLET | Freq: Three times a day (TID) | ORAL | 0 refills | Status: AC | PRN

## 2022-09-10 NOTE — TOC Transition Note (Signed)
Transition of Care [facility]) - CM/SW Discharge Note   Patient Details  Name: Sara Mathis MRN: [id number] Date of Birth: 03-19-1986  Transition of Care [facility] CM/SW Contact:  Tom-Johnson, Renea Ee, RN Phone Number: 09/10/2022, 10:24 AM   Clinical Narrative:     Patient is scheduled for discharge today. No TOC needs or recommendation noted. Patient to f/u with her Endocrinologist outpatient. Family to transport at discharge. No further TOC needs noted.      Final next level of care: Home/Self Care Barriers to Discharge: Barriers Resolved   Patient Goals and CMS Choice CMS Medicare.gov Compare Post Acute Care list provided to:: Patient Choice offered to / list presented to : NA  Discharge Placement                  Patient to be transferred to facility by: Family      Discharge Plan and Services Additional resources added to the After Visit Summary for                  DME Arranged: N/A DME Agency: NA       HH Arranged: NA HH Agency: NA        Social Determinants of Health ([place]) Interventions [place]: No Food Insecurity (07/17/2022)  Housing: Low Risk  (07/17/2022)  Transportation Needs: No Transportation Needs (07/17/2022)  Utilities: Not At Risk (07/17/2022)  Depression (PHQ2-9): Medium Risk (01/14/2020)  Financial Resource Strain: Low Risk  (07/17/2022)  Tobacco Use: Low Risk  (09/08/2022)     Readmission Risk Interventions    09/10/2022   10:23 AM  Readmission Risk Prevention Plan  Transportation Screening Complete  PCP or Specialist Appt within 5-7 Days Complete  Home Care Screening Complete  Medication Review (RN CM) Referral to Pharmacy

## 2022-09-10 NOTE — Progress Notes (Signed)
AVS review with patient.  All questions answered.  Patient excited to be discharged home.  Visitor on site to give a ride home.

## 2022-09-10 NOTE — Discharge Summary (Addendum)
Physician Discharge Summary  Sara Mathis [place] GTX:[id number] DOB: 07/16/1986 DOA: 09/06/2022  PCP: [name], MD  Admit date: 09/06/2022 Discharge date: 09/10/2022 30 Day Unplanned Readmission Risk Score    Flowsheet Row ED to Hosp-Admission (Current) from 09/06/2022 in [facility]  30 Day Unplanned Readmission Risk Score (%) 16.92 Filed at 09/10/2022 0801       This score is the patient's risk of an unplanned readmission within 30 days of being discharged (0 -100%). The score is based on dignosis, age, lab data, medications, orders, and past utilization.   Low:  0-14.9   Medium: 15-21.9   High: 22-29.9   Extreme: 30 and above          Admitted From: Home Disposition: Home  Recommendations for Outpatient Follow-up:  Follow up with PCP in 1-2 weeks, as a scheduled on December 28. Please obtain BMP/CBC in one week Follow-up with your endocrinologist tomorrow Please follow up with your PCP on the following pending results: Unresulted Labs (From admission, onward)     Start     Ordered   09/07/22 1256  Gastrointestinal Panel by PCR , Stool  (Gastrointestinal Panel by PCR, Stool                                                                                                                                                     **Does Not include CLOSTRIDIUM DIFFICILE testing. **If CDIFF testing is needed, place order from the "C Difficile Testing" order set.**)  Once,   R        09/07/22 [place]: None Equipment/Devices: None  Discharge Condition: Stable CODE STATUS: Full Diet recommendation: Cardiac  Subjective: Seen and examined.  She has no complaints.  She desires to go home.  Brief/Interim Summary: Sara Mathis is a 36 y.o. female with medical history significant of migraine headaches and Graves' disease s/p total thyroidectomy who presented with complaints of nausea and  headache,  blurry vision. Reports associated symptoms of chills, dizziness, nasal congestion, vomiting, diarrhea, and some abdominal discomfort.   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  Initially met SIRS criteria and have been admitted by PCCM due to agitation requiring Precedex drip.  There was concern for meningitis, but LP cultures were negative.  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  Neurosurgery had been consulted, but felt no acute need for surgical intervention and for patient to follow-up in outpatient setting.  She is placed on hydrocortisone twice daily along with Synthroid by PCCM after discussions with endocrine then started on desmopressin for suspected diabetes  insipidus.  She followed up with Dr. [name] on 11/22, and patient reports that she has been taking all of her medications as prescribed.   In the emergency department patient was seen to be febrile up to 101.2 F with tachycardia and tachypnea meeting SIRS criteria.  CT scan of the head showed enlarged sella with cystic appearing lesion in the pituitary region without interval change.  Labs significant for potassium 3->3.6, BUN-7->10, creatinine-1.03->1.01, magnesium-1.6, TSH- <0.01,  free T4-2.43, and lactic acid- 1.7->1.4.  Chest x-ray noted no acute abnormality.  Urinalysis noted trace leukocytes with no bacteria seen or significant signs of infection. Blood and urine cultures were obtained.  No clear source of infection was appreciated and therefore antibiotics were not started.  Patient was given 1 L of lactated Ringer's, acetaminophen 1000 mg p.o., Zofran IV, potassium chloride 10 mEq IV, 2 g of magnesium sulfate, and hydrocortisone 100 mg IV.  She was admitted to hospital service.  She was started on empiric antibiotics after ID  consultation.  All the cultures including blood cultures remain negative.  No leukocytosis and lactic acid normal.  She has not had any fever since admission.  She was reevaluated by ID yesterday and antibiotics were discontinued since it appears that her symptoms were likely secondary to thyrotoxicosis.  She is now cleared for discharge.  She has appointment with her endocrinologist tomorrow and PCP in 3 days.  We have discontinued her Synthroid.   Question iatrogenic thyrotoxicosis Acute.  Patient with history of Graves' disease status post total thyroidectomy on levothyroxine 125 mcg daily. Labs noted Free T4-2.43 with TSH- <0.01.  Admitting hospitalist tried to reach Dr. [name] who was out of the office and he discussed with his partner who agreed with need to hold levothyroxine for at least 1 week and decreasing dose.  Unclear that all of patient's symptoms could be caused by this. -Hold levothyroxine for at least 1 week due to the half-life.  Tachycardia improved after starting on metoprolol 12.5 mg p.o. twice daily.  However she had an episode of bradycardia which was asymptomatic and thus I am not prescribing her metoprolol at discharge.   Hypomagnesemia Replaced and resolved.  Hypokalemia: Will replace before discharge.   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  Had been seen by neurosurgery during last admission and recommended to follow-up in outpatient setting is reported no acute need for surgical intervention at that time.  CT scan of the brain did not note any acute change in the cystic pituitary lesion.  Patient was given stress dose of steroids.   Central diabetes insipidus Evaluated by nephrology during last hospitalization and placed on desmopressin. -Continue desmopressin   AKI: Patient's baseline hemoglobin appears to be around 0.7, she presented 1.03, she  was hydrated, now down to 0.78.  Discharge plan was discussed with patient and/or family member and they verbalized understanding and agreed with it.  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   desmopressin 0.1 MG tablet Commonly known as: DDAVP Take 0.5 tablets (0.05 mg total) by mouth at bedtime. What changed: how much to take   hydrocortisone 10 MG tablet Commonly known as: CORTEF Take 1 tablet (10 mg total) by mouth 2 (two) times daily. What changed:  how much to take when to take this additional instructions   hydrocortisone 2.5 % rectal cream Commonly known as: Proctozone-HC Place 1 Application rectally 2 (two) times daily.   Magnesium 200 MG Tabs Take 1 tablet by mouth daily.   ondansetron 4 MG disintegrating tablet Commonly known as: ZOFRAN-ODT Take 1 tablet (4 mg total) by mouth every 8 (eight) hours as needed for nausea or vomiting.   Potassium 99 MG Tabs Take 1 tablet by mouth daily.   topiramate 25 MG tablet Commonly known as: TOPAMAX 1 tab by mouth at bedtime What changed:  how much to take how to take this when to take this additional instructions   vitamin C 250 MG tablet Commonly known as: ASCORBIC ACID Take 250 mg by mouth daily.        Follow-up Information     [name], MD Follow up in 1 week(s).    Specialty: Internal Medicine Contact information: [place] [address] [phone]                No Known Allergies  Consultations: Curb sided with endocrinology   Procedures/Studies: DG Chest Port 1 View  Result Date: 09/07/2022 CLINICAL DATA:  Headache, vomiting, blurred vision EXAM: PORTABLE CHEST 1 VIEW COMPARISON:  07/19/2022 FINDINGS: Lungs are clear.  No pleural effusion or pneumothorax. The heart is normal in size. IMPRESSION: No evidence of acute cardiopulmonary disease. Electronically Signed   By: Julian Hy M.D.   On: 09/07/2022 03:16   CT Head Wo Contrast  Result Date: 09/06/2022 CLINICAL DATA:  Headache, increasing frequency or severity Known pituitary tumor, worsening headache, new onset emesis EXAM: CT HEAD WITHOUT CONTRAST TECHNIQUE: Contiguous axial images were obtained from the base of the skull through the vertex without intravenous contrast. RADIATION DOSE REDUCTION: This exam was performed according to the departmental dose-optimization program which includes automated exposure control, adjustment of the mA and/or kV according to patient size and/or use of iterative reconstruction technique. COMPARISON:  08/24/2022. FINDINGS: Brain: No evidence of acute infarction, hemorrhage, hydrocephalus, extra-axial collection or mass effect. Cystic appearing lesion with expansion of the sella is a stable finding measuring about 1.8 cm. Vascular: No hyperdense vessel or unexpected calcification. Skull: Normal. Negative for fracture or focal lesion. Sinuses/Orbits: No acute finding. 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 No hydrocephalus. No mass effect or shift. Electronically Signed   By: Sammie Bench M.D.   On: 09/06/2022 18:00   CT Head Wo Contrast  Result Date: 08/24/2022 CLINICAL DATA:  History of pituitary tumor with headache and vomiting this morning. EXAM: CT HEAD  WITHOUT CONTRAST TECHNIQUE: Contiguous axial images were obtained from the base of the skull through the vertex without intravenous contrast. RADIATION DOSE REDUCTION: This exam was performed according to the departmental dose-optimization program which includes automated exposure control, adjustment of the mA and/or kV according to patient size and/or use of iterative reconstruction technique. COMPARISON:  Head CT 07/19/2022,  brain MRI 07/20/2022 FINDINGS: Brain: Primarily hypodense pituitary lesion spans 18 mm cranial caudal, unchanged in size from prior head CT. This is overall stable in CT appearance. No evidence of acute hemorrhage. No hydrocephalus. No acute ischemia, subdural/extra-axial collection or midline shift. Vascular: No hyperdense vessel or unexpected calcification. Skull: No fracture or focal lesion. Sinuses/Orbits: No acute findings. Other: None. IMPRESSION: 1. No acute intracranial abnormality. 2. Unchanged size and CT appearance of known pituitary tumor since last month. Electronically Signed   By: Keith Rake M.D.   On: 08/24/2022 19:22     Discharge Exam: Vitals:   09/09/22 2134 09/10/22 0903  BP: 115/89 122/82  Pulse: 61 (!) 58  Resp: (!) 25 18  Temp:  97.7 F (36.5 C)  SpO2: 100% 100%   Vitals:   09/08/22 2118 09/09/22 1336 09/09/22 2134 09/10/22 0903  BP: 115/77 115/74 115/89 122/82  Pulse: 87 61 61 (!) 58  Resp: (!) 24 20 (!) 25 18  Temp:  (!) 97.5 F (36.4 C)  97.7 F (36.5 C)  TempSrc:  Oral  Oral  SpO2: 98% 100% 100% 100%  Weight:      Height:        General: Pt is alert, awake, not in acute distress Cardiovascular: RRR, S1/S2 +, no rubs, no gallops Respiratory: CTA bilaterally, no wheezing, no rhonchi Abdominal: Soft, NT, ND, bowel sounds + Extremities: no edema, no cyanosis    The results of significant diagnostics from this hospitalization (including imaging, microbiology, ancillary and laboratory) are listed below for reference.      Microbiology: Recent Results (from the past 240 hour(s))  Resp panel by RT-PCR (RSV, Flu A&B, Covid) Anterior Nasal Swab     Status: None   Collection Time: 09/06/22  3:56 PM   Specimen: Anterior Nasal Swab  Result Value Ref Range Status   SARS Coronavirus 2 by RT PCR NEGATIVE NEGATIVE Final    Comment: (NOTE) SARS-CoV-2 target nucleic acids are NOT DETECTED.  The SARS-CoV-2 RNA is generally detectable in upper respiratory specimens during the acute phase of infection. The lowest concentration of SARS-CoV-2 viral copies this assay can detect is 138 copies/mL. A negative result does not preclude SARS-Cov-2 infection and should not be used as the sole basis for treatment or other patient management decisions. A negative result may occur with  improper specimen collection/handling, submission of specimen other than nasopharyngeal swab, presence of viral mutation(s) within the areas targeted by this assay, and inadequate number of viral copies(<138 copies/mL). A negative result must be combined with clinical observations, patient history, and epidemiological information. The expected result is Negative.  Fact Sheet for Patients:  EntrepreneurPulse.com.au  Fact Sheet for Healthcare Providers:  IncredibleEmployment.be  This test is no t yet approved or cleared by the [place] FDA and  has been authorized for detection and/or diagnosis of SARS-CoV-2 by FDA under an Emergency Use Authorization (EUA). This EUA will remain  in effect (meaning this test can be used) for the duration of the COVID-19 declaration under Section 564(b)(1) of the Act, 21 U.S.C.section 360bbb-3(b)(1), unless the authorization is terminated  or revoked sooner.       Influenza A by PCR NEGATIVE NEGATIVE Final   Influenza B by PCR NEGATIVE NEGATIVE Final    Comment: (NOTE) The Xpert Xpress SARS-CoV-2/FLU/RSV plus assay is intended as an aid in the diagnosis of  influenza from Nasopharyngeal swab specimens and should not be used as a sole basis for treatment. Nasal washings and aspirates are unacceptable  for Xpert Xpress SARS-CoV-2/FLU/RSV testing.  Fact Sheet for Patients: EntrepreneurPulse.com.au  Fact Sheet for Healthcare Providers: IncredibleEmployment.be  This test is not yet approved or cleared by the [place] FDA and has been authorized for detection and/or diagnosis of SARS-CoV-2 by FDA under an Emergency Use Authorization (EUA). This EUA will remain in effect (meaning this test can be used) for the duration of the COVID-19 declaration under Section 564(b)(1) of the Act, 21 U.S.C. section 360bbb-3(b)(1), unless the authorization is terminated or revoked.     Resp Syncytial Virus by PCR NEGATIVE NEGATIVE Final    Comment: (NOTE) Fact Sheet for Patients: EntrepreneurPulse.com.au  Fact Sheet for Healthcare Providers: IncredibleEmployment.be  This test is not yet approved or cleared by the [place] FDA and has been authorized for detection and/or diagnosis of SARS-CoV-2 by FDA under an Emergency Use Authorization (EUA). This EUA will remain in effect (meaning this test can be used) for the duration of the COVID-19 declaration under Section 564(b)(1) of the Act, 21 U.S.C. section 360bbb-3(b)(1), unless the authorization is terminated or revoked.  Performed at [facility], [place] [address], [place], [place] [address]   Blood Culture (routine x 2)     Status: None (Preliminary result)   Collection Time: 09/07/22  2:49 AM   Specimen: BLOOD  Result Value Ref Range Status   Specimen Description BLOOD LEFT ANTECUBITAL  Final   Special Requests   Final    BOTTLES DRAWN AEROBIC AND ANAEROBIC Blood Culture results may not be optimal due to an inadequate volume of blood received in culture bottles   Culture   Final    NO GROWTH 3 DAYS Performed at  [facility], [place] [address] [place], [place] [address]    Report Status PENDING  Incomplete  Blood Culture (routine x 2)     Status: None (Preliminary result)   Collection Time: 09/07/22  2:50 AM   Specimen: BLOOD  Result Value Ref Range Status   Specimen Description BLOOD RIGHT ANTECUBITAL  Final   Special Requests   Final    BOTTLES DRAWN AEROBIC AND ANAEROBIC Blood Culture adequate volume   Culture   Final    NO GROWTH 3 DAYS Performed at [facility], [place] [address], [place], [place] [address]    Report Status PENDING  Incomplete  Respiratory (~20 pathogens) panel by PCR     Status: None   Collection Time: 09/07/22 12:56 PM   Specimen: Nasopharyngeal Swab; Respiratory  Result Value Ref Range Status   Adenovirus NOT DETECTED NOT DETECTED Final   Coronavirus 229E NOT DETECTED NOT DETECTED Final    Comment: (NOTE) The Coronavirus on the Respiratory Panel, DOES NOT test for the novel  Coronavirus (2019 nCoV)    Coronavirus HKU1 NOT DETECTED NOT DETECTED Final   Coronavirus NL63 NOT DETECTED NOT DETECTED Final   Coronavirus OC43 NOT DETECTED NOT DETECTED Final   Metapneumovirus NOT DETECTED NOT DETECTED Final   Rhinovirus / Enterovirus NOT DETECTED NOT DETECTED Final   Influenza A NOT DETECTED NOT DETECTED Final   Influenza B NOT DETECTED NOT DETECTED Final   Parainfluenza Virus 1 NOT DETECTED NOT DETECTED Final   Parainfluenza Virus 2 NOT DETECTED NOT DETECTED Final   Parainfluenza Virus 3 NOT DETECTED NOT DETECTED Final   Parainfluenza Virus 4 NOT DETECTED NOT DETECTED Final   Respiratory Syncytial Virus NOT DETECTED NOT DETECTED Final   Bordetella pertussis NOT DETECTED NOT DETECTED Final   Bordetella Parapertussis NOT DETECTED  NOT DETECTED Final   Chlamydophila pneumoniae NOT DETECTED NOT DETECTED Final   Mycoplasma pneumoniae NOT DETECTED NOT DETECTED Final    Comment: Performed at [facility], [place] [address] [place], [place] [address]      Labs: BNP (last 3 results) Recent Labs    07/24/22 0425  BNP 17.4   Basic Metabolic Panel: Recent Labs  Lab 09/06/22 1630 09/07/22 0250 09/07/22 0500 09/08/22 0245 09/09/22 0313 09/10/22 0314  NA 137 135  --  139 138 144  K 3.0* 3.6  --  3.9 3.0* 3.2*  CL 104 103  --  110 111 115*  CO2 24 22  --  21* 20* 21*  GLUCOSE 96 82  --  127* 108* 113*  BUN 7 10  --  _0 CREATININE 1.03* 1.01*  --  0.78 0.59 0.71  CALCIUM 9.2 9.0  --  9.1 8.3* 8.8*  MG  --   --  1.6* 1.9 1.6* 1.9   Liver Function Tests: Recent Labs  Lab 09/07/22 0250 09/09/22 0313  AST 20 15  ALT 15 13  ALKPHOS 39 30*  BILITOT 1.1 0.6  PROT 6.7 5.0*  ALBUMIN 3.6 2.6*   No results for input(s): "LIPASE", "AMYLASE" in the last 168 hours. No results for input(s): "AMMONIA" in the last 168 hours. CBC: Recent Labs  Lab 09/06/22 1630 09/07/22 0250 09/09/22 0313  WBC 6.5 7.0 5.6  NEUTROABS 3.5 3.6 4.3  HGB 13.4 13.9 9.6*  HCT 41.6 42.2 28.9*  MCV 91.0 90.8 89.5  PLT 224 172 125*   Cardiac Enzymes: No results for input(s): "CKTOTAL", "CKMB", "CKMBINDEX", "TROPONINI" in the last 168 hours. BNP: Invalid input(s): "POCBNP" CBG: No results for input(s): "GLUCAP" in the last 168 hours. D-Dimer No results for input(s): "DDIMER" in the last 72 hours. Hgb A1c No results for input(s): "HGBA1C" in the last 72 hours. Lipid Profile No results for input(s): "CHOL", "HDL", "LDLCALC", "TRIG", "CHOLHDL", "LDLDIRECT" in the last 72 hours. Thyroid function studies No results for input(s): "TSH", "T4TOTAL", "T3FREE", "THYROIDAB" in the last 72 hours.  Invalid input(s): "FREET3" Anemia work up No results for input(s): "VITAMINB12", "FOLATE", "FERRITIN", "TIBC", "IRON", "RETICCTPCT" in the last 72 hours. Urinalysis    Component Value Date/Time   COLORURINE YELLOW 09/06/2022 1522   APPEARANCEUR CLEAR 09/06/2022 1522   LABSPEC 1.010 09/06/2022 1522   PHURINE 8.0 09/06/2022 1522   GLUCOSEU NEGATIVE  09/06/2022 1522   HGBUR NEGATIVE 09/06/2022 1522   BILIRUBINUR NEGATIVE 09/06/2022 1522   KETONESUR NEGATIVE 09/06/2022 1522   PROTEINUR NEGATIVE 09/06/2022 1522   NITRITE NEGATIVE 09/06/2022 1522   LEUKOCYTESUR TRACE (A) 09/06/2022 1522   Sepsis Labs Recent Labs  Lab 09/06/22 1630 09/07/22 0250 09/09/22 0313  WBC 6.5 7.0 5.6   Microbiology Recent Results (from the past 240 hour(s))  Resp panel by RT-PCR (RSV, Flu A&B, Covid) Anterior Nasal Swab     Status: None   Collection Time: 09/06/22  3:56 PM   Specimen: Anterior Nasal Swab  Result Value Ref Range Status   SARS Coronavirus 2 by RT PCR NEGATIVE NEGATIVE Final    Comment: (NOTE) SARS-CoV-2 target nucleic acids are NOT DETECTED.  The SARS-CoV-2 RNA is generally detectable in upper respiratory specimens during the acute phase of infection. The lowest concentration of SARS-CoV-2 viral copies this assay can detect is 138 copies/mL. A negative result does not preclude SARS-Cov-2 infection and should not be used as the sole basis for treatment or other patient  management decisions. A negative result may occur with  improper specimen collection/handling, submission of specimen other than nasopharyngeal swab, presence of viral mutation(s) within the areas targeted by this assay, and inadequate number of viral copies(<138 copies/mL). A negative result must be combined with clinical observations, patient history, and epidemiological information. The expected result is Negative.  Fact Sheet for Patients:  EntrepreneurPulse.com.au  Fact Sheet for Healthcare Providers:  IncredibleEmployment.be  This test is no t yet approved or cleared by the [place] FDA and  has been authorized for detection and/or diagnosis of SARS-CoV-2 by FDA under an Emergency Use Authorization (EUA). This EUA will remain  in effect (meaning this test can be used) for the duration of the COVID-19 declaration under  Section 564(b)(1) of the Act, 21 U.S.C.section 360bbb-3(b)(1), unless the authorization is terminated  or revoked sooner.       Influenza A by PCR NEGATIVE NEGATIVE Final   Influenza B by PCR NEGATIVE NEGATIVE Final    Comment: (NOTE) The Xpert Xpress SARS-CoV-2/FLU/RSV plus assay is intended as an aid in the diagnosis of influenza from Nasopharyngeal swab specimens and should not be used as a sole basis for treatment. Nasal washings and aspirates are unacceptable for Xpert Xpress SARS-CoV-2/FLU/RSV testing.  Fact Sheet for Patients: EntrepreneurPulse.com.au  Fact Sheet for Healthcare Providers: IncredibleEmployment.be  This test is not yet approved or cleared by the [place] FDA and has been authorized for detection and/or diagnosis of SARS-CoV-2 by FDA under an Emergency Use Authorization (EUA). This EUA will remain in effect (meaning this test can be used) for the duration of the COVID-19 declaration under Section 564(b)(1) of the Act, 21 U.S.C. section 360bbb-3(b)(1), unless the authorization is terminated or revoked.     Resp Syncytial Virus by PCR NEGATIVE NEGATIVE Final    Comment: (NOTE) Fact Sheet for Patients: EntrepreneurPulse.com.au  Fact Sheet for Healthcare Providers: IncredibleEmployment.be  This test is not yet approved or cleared by the [place] FDA and has been authorized for detection and/or diagnosis of SARS-CoV-2 by FDA under an Emergency Use Authorization (EUA). This EUA will remain in effect (meaning this test can be used) for the duration of the COVID-19 declaration under Section 564(b)(1) of the Act, 21 U.S.C. section 360bbb-3(b)(1), unless the authorization is terminated or revoked.  Performed at [facility], [place] [address], [place], [place] [address]   Blood Culture (routine x 2)     Status: None (Preliminary result)   Collection Time: 09/07/22  2:49 AM    Specimen: BLOOD  Result Value Ref Range Status   Specimen Description BLOOD LEFT ANTECUBITAL  Final   Special Requests   Final    BOTTLES DRAWN AEROBIC AND ANAEROBIC Blood Culture results may not be optimal due to an inadequate volume of blood received in culture bottles   Culture   Final    NO GROWTH 3 DAYS Performed at [facility], [place] [address], [place], [place] [address]    Report Status PENDING  Incomplete  Blood Culture (routine x 2)     Status: None (Preliminary result)   Collection Time: 09/07/22  2:50 AM   Specimen: BLOOD  Result Value Ref Range Status   Specimen Description BLOOD RIGHT ANTECUBITAL  Final   Special Requests   Final    BOTTLES DRAWN AEROBIC AND ANAEROBIC Blood Culture adequate volume   Culture   Final    NO GROWTH 3 DAYS Performed at [facility], [place] [address] [place], [place]  [address]    Report Status PENDING  Incomplete  Respiratory (~20 pathogens) panel by PCR     Status: None   Collection Time: 09/07/22 12:56 PM   Specimen: Nasopharyngeal Swab; Respiratory  Result Value Ref Range Status   Adenovirus NOT DETECTED NOT DETECTED Final   Coronavirus 229E NOT DETECTED NOT DETECTED Final    Comment: (NOTE) The Coronavirus on the Respiratory Panel, DOES NOT test for the novel  Coronavirus (2019 nCoV)    Coronavirus HKU1 NOT DETECTED NOT DETECTED Final   Coronavirus NL63 NOT DETECTED NOT DETECTED Final   Coronavirus OC43 NOT DETECTED NOT DETECTED Final   Metapneumovirus NOT DETECTED NOT DETECTED Final   Rhinovirus / Enterovirus NOT DETECTED NOT DETECTED Final   Influenza A NOT DETECTED NOT DETECTED Final   Influenza B NOT DETECTED NOT DETECTED Final   Parainfluenza Virus 1 NOT DETECTED NOT DETECTED Final   Parainfluenza Virus 2 NOT DETECTED NOT DETECTED Final   Parainfluenza Virus 3 NOT DETECTED NOT DETECTED Final   Parainfluenza Virus 4 NOT DETECTED NOT DETECTED Final   Respiratory Syncytial Virus NOT DETECTED NOT DETECTED Final    Bordetella pertussis NOT DETECTED NOT DETECTED Final   Bordetella Parapertussis NOT DETECTED NOT DETECTED Final   Chlamydophila pneumoniae NOT DETECTED NOT DETECTED Final   Mycoplasma pneumoniae NOT DETECTED NOT DETECTED Final    Comment: Performed at [facility], [place]. [address] [place], [place] [address]     Time coordinating discharge: Over 30 minutes  SIGNED:   Darliss Cheney, MD  [facility] 09/10/2022, 12:11 PM *Please note that this is a verbal dictation therefore any spelling or grammatical errors are due to the "[employer]" system interpretation. If 7PM-7AM, please contact night-coverage [email or web address]

## 2022-09-12 LAB — CULTURE, BLOOD (ROUTINE X 2)
Culture: NO GROWTH
Culture: NO GROWTH
Special Requests: ADEQUATE

## 2022-09-13 ENCOUNTER — Ambulatory Visit: Payer: Self-pay | Admitting: Internal Medicine

## 2022-09-13 ENCOUNTER — Other Ambulatory Visit: Payer: Self-pay

## 2022-09-13 DIAGNOSIS — D649 Anemia, unspecified: Secondary | ICD-10-CM

## 2022-09-13 NOTE — Progress Notes (Signed)
She was just seen 2 days ago by Dr. [name]. K+ was 4.0 Na+ was 142 FT4 high at 1.22 and holding levothyroxine until returns for repeat thyroid labs on Jan 10th.   She has not had repeat CBC and hemoglobin down when last checked with hospitalization over Christmas.  Repeat CBC today. She would like her CPE rescheduled as just not feeling up to it today. We will reschedule.

## 2022-09-14 LAB — CBC WITH DIFFERENTIAL/PLATELET
Basophils Absolute: 0 10*3/uL (ref 0.0–0.2)
Basos: 0 %
EOS (ABSOLUTE): 0.1 10*3/uL (ref 0.0–0.4)
Eos: 1 %
Hematocrit: 39.5 % (ref 34.0–46.6)
Hemoglobin: 12.4 g/dL (ref 11.1–15.9)
Immature Grans (Abs): 0 10*3/uL (ref 0.0–0.1)
Immature Granulocytes: 0 %
Lymphocytes Absolute: 2.4 10*3/uL (ref 0.7–3.1)
Lymphs: 59 %
MCH: 28.8 pg (ref 26.6–33.0)
MCHC: 31.4 g/dL — ABNORMAL LOW (ref 31.5–35.7)
MCV: 92 fL (ref 79–97)
Monocytes Absolute: 0.2 10*3/uL (ref 0.1–0.9)
Monocytes: 5 %
Neutrophils Absolute: 1.5 10*3/uL (ref 1.4–7.0)
Neutrophils: 35 %
Platelets: 268 10*3/uL (ref 150–450)
RBC: 4.31 x10E6/uL (ref 3.77–5.28)
RDW: 12.4 % (ref 11.7–15.4)
WBC: 4.2 10*3/uL (ref 3.4–10.8)

## 2022-09-18 ENCOUNTER — Encounter: Payer: Self-pay | Admitting: Physical Therapy

## 2022-09-20 ENCOUNTER — Encounter: Payer: Self-pay | Admitting: Physical Therapy

## 2022-09-20 ENCOUNTER — Telehealth: Payer: Self-pay | Admitting: Internal Medicine

## 2022-09-20 NOTE — Telephone Encounter (Signed)
Pt would like a letter stating her diagnosis. Pt plans on sending it to her mother in her [place].

## 2022-09-25 ENCOUNTER — Emergency Department (HOSPITAL_COMMUNITY): Payer: Self-pay

## 2022-09-25 ENCOUNTER — Encounter (HOSPITAL_COMMUNITY): Payer: Self-pay | Admitting: Emergency Medicine

## 2022-09-25 ENCOUNTER — Inpatient Hospital Stay (HOSPITAL_COMMUNITY)
Admission: EM | Admit: 2022-09-25 | Discharge: 2022-09-28 | DRG: 871 | Disposition: A | Discharge status: Home / Self Care | Payer: Self-pay | Attending: Internal Medicine | Admitting: Internal Medicine

## 2022-09-25 DIAGNOSIS — E230 Hypopituitarism: Secondary | ICD-10-CM | POA: Diagnosis present

## 2022-09-25 DIAGNOSIS — Z8249 Family history of ischemic heart disease and other diseases of the circulatory system: Secondary | ICD-10-CM

## 2022-09-25 DIAGNOSIS — N179 Acute kidney failure, unspecified: Secondary | ICD-10-CM | POA: Diagnosis present

## 2022-09-25 DIAGNOSIS — Z818 Family history of other mental and behavioral disorders: Secondary | ICD-10-CM

## 2022-09-25 DIAGNOSIS — Z833 Family history of diabetes mellitus: Secondary | ICD-10-CM

## 2022-09-25 DIAGNOSIS — E890 Postprocedural hypothyroidism: Secondary | ICD-10-CM | POA: Diagnosis present

## 2022-09-25 DIAGNOSIS — G928 Other toxic encephalopathy: Secondary | ICD-10-CM | POA: Diagnosis present

## 2022-09-25 DIAGNOSIS — J189 Pneumonia, unspecified organism: Secondary | ICD-10-CM | POA: Diagnosis present

## 2022-09-25 DIAGNOSIS — E876 Hypokalemia: Secondary | ICD-10-CM | POA: Diagnosis present

## 2022-09-25 DIAGNOSIS — E232 Diabetes insipidus: Secondary | ICD-10-CM | POA: Diagnosis present

## 2022-09-25 DIAGNOSIS — Z1152 Encounter for screening for COVID-19: Secondary | ICD-10-CM

## 2022-09-25 DIAGNOSIS — R6520 Severe sepsis without septic shock: Secondary | ICD-10-CM | POA: Diagnosis present

## 2022-09-25 DIAGNOSIS — A419 Sepsis, unspecified organism: Principal | ICD-10-CM | POA: Diagnosis present

## 2022-09-25 LAB — TROPONIN I (HIGH SENSITIVITY): Troponin I (High Sensitivity): 5 ng/L (ref <18)

## 2022-09-25 LAB — CBC WITH DIFFERENTIAL/PLATELET
Abs Immature Granulocytes: 0.02 10*3/uL (ref 0.00–0.07)
Basophils Absolute: 0 10*3/uL (ref 0.0–0.1)
Basophils Relative: 0 %
Eosinophils Absolute: 0.1 10*3/uL (ref 0.0–0.5)
Eosinophils Relative: 1 %
HCT: 47.2 % — ABNORMAL HIGH (ref 36.0–46.0)
Hemoglobin: 14.9 g/dL (ref 12.0–15.0)
Immature Granulocytes: 0 %
Lymphocytes Relative: 54 %
Lymphs Abs: 3.8 10*3/uL (ref 0.7–4.0)
MCH: 28.8 pg (ref 26.0–34.0)
MCHC: 31.6 g/dL (ref 30.0–36.0)
MCV: 91.3 fL (ref 80.0–100.0)
Monocytes Absolute: 0.8 10*3/uL (ref 0.1–1.0)
Monocytes Relative: 10 %
Neutro Abs: 2.5 10*3/uL (ref 1.7–7.7)
Neutrophils Relative %: 35 %
Platelets: 243 10*3/uL (ref 150–400)
RBC: 5.17 MIL/uL — ABNORMAL HIGH (ref 3.87–5.11)
RDW: 13.9 % (ref 11.5–15.5)
WBC: 7.2 10*3/uL (ref 4.0–10.5)
nRBC: 0 % (ref 0.0–0.2)

## 2022-09-25 LAB — RESP PANEL BY RT-PCR (RSV, FLU A&B, COVID)  RVPGX2
Influenza A by PCR: NEGATIVE
Influenza B by PCR: NEGATIVE
Resp Syncytial Virus by PCR: NEGATIVE
SARS Coronavirus 2 by RT PCR: NEGATIVE

## 2022-09-25 LAB — LACTIC ACID, PLASMA: Lactic Acid, Venous: 2.3 mmol/L (ref 0.5–1.9)

## 2022-09-25 LAB — I-STAT BETA HCG BLOOD, ED (MC, WL, AP ONLY): I-stat hCG, quantitative: <5 m[IU]/mL (ref <5)

## 2022-09-25 LAB — TSH: TSH: <0.01 u[IU]/mL — ABNORMAL LOW (ref 0.350–4.500)

## 2022-09-25 MED ORDER — ACETAMINOPHEN 650 MG RE SUPP
650.0000 mg | Freq: Once | RECTAL | Status: AC
  Administered 2022-09-25: 650 mg via RECTAL
  Filled 2022-09-25: qty 1

## 2022-09-25 MED ORDER — SODIUM CHLORIDE 0.9 % IV BOLUS
1000.0000 mL | Freq: Once | INTRAVENOUS | Status: AC
  Administered 2022-09-26: 1000 mL via INTRAVENOUS

## 2022-09-25 MED ORDER — SODIUM CHLORIDE 0.9 % IV BOLUS
1000.0000 mL | Freq: Once | INTRAVENOUS | Status: AC
  Administered 2022-09-25: 1000 mL via INTRAVENOUS

## 2022-09-25 MED ORDER — SODIUM CHLORIDE 0.9 % IV SOLN
1.0000 g | Freq: Once | INTRAVENOUS | Status: AC
  Administered 2022-09-26: 1 g via INTRAVENOUS
  Filled 2022-09-25: qty 10

## 2022-09-25 MED ORDER — SODIUM CHLORIDE 0.9 % IV SOLN
500.0000 mg | Freq: Once | INTRAVENOUS | Status: AC
  Administered 2022-09-26: 500 mg via INTRAVENOUS
  Filled 2022-09-25: qty 5

## 2022-09-25 NOTE — ED Notes (Signed)
Pt taken to CT.

## 2022-09-25 NOTE — ED Triage Notes (Addendum)
Pt c/o of headache, centralized chest pain, n/v, blurry vision shortness of breath starting yesterday. Per family, pt has been confused and not making sense since yesterday. Pt oriented to self, time, and place at this time.

## 2022-09-25 NOTE — ED Provider Notes (Signed)
[facility] EMERGENCY DEPARTMENT Provider Note   CSN: [id number] Arrival date & time: 09/25/22  2113     History {Add pertinent medical, surgical, social history, OB history to HPI:1} Chief Complaint  Patient presents with   Altered Mental Status   Headache   Chest Pain    Sara Mathis is a 37 y.o. female.  The history is provided by the patient and medical records.  Altered Mental Status Associated symptoms: headaches   Headache Chest Pain Associated symptoms: altered mental status and headache    37 y.o. 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  Mother reports over the past week she has been complaining of headaches, nausea, and vomiting.  She was seen by and placed on meclizine as well as Hydrocort, has completed 8 days worth thus far but does not seem to be getting any better.  Also reports her synthroid was discontinued at that visit, was going to check levels again tomorrow at follow-up.  Today, patient seemed more altered to her mother and has been feeling warm to the touch.  They have not had any sick contacts, no known COVID or flu exposures.  Home Medications Prior to Admission medications   Medication Sig Start Date End Date Taking? Authorizing Provider  cholecalciferol (VITAMIN D3) 25 MCG (1000 UNIT) tablet Take 1,000 Units by mouth daily.    [provider]  desmopressin (DDAVP) 0.1 MG tablet Take 0.5 tablets (0.05 mg total) by mouth at bedtime. Patient taking differently: Take 0.1 mg by mouth at bedtime. 07/24/22   Thurnell Lose, MD  hydrocortisone (CORTEF) 10 MG tablet Take 1 tablet (10 mg total) by mouth 2 (two) times daily. Patient taking differently: Take 5-15 mg by mouth See admin instructions. Take 1.5 tablet by mouth in the morning, then 0.5 tablet by mouth in the evening  07/24/22   Thurnell Lose, MD  hydrocortisone (PROCTOZONE-HC) 2.5 % rectal cream Place 1 Application rectally 2 (two) times daily. 08/16/22   [name], MD  Magnesium 200 MG TABS Take 1 tablet by mouth daily.    [provider]  ondansetron (ZOFRAN-ODT) 4 MG disintegrating tablet Take 1 tablet (4 mg total) by mouth every 8 (eight) hours as needed for nausea or vomiting. 09/10/22   Darliss Cheney, MD  Potassium 99 MG TABS Take 1 tablet by mouth daily.    [provider]  topiramate (TOPAMAX) 25 MG tablet 1 tab by mouth at bedtime Patient taking differently: Take 25 mg by mouth at bedtime. 07/17/22   [name], MD  vitamin C (ASCORBIC ACID) 250 MG tablet Take 250 mg by mouth daily.    [provider]      Allergies    Patient has no known allergies.    Review of Systems   Review of Systems  Cardiovascular:  Positive for chest pain.  Neurological:  Positive for headaches.  All other systems reviewed and are negative.   Physical Exam Updated Vital Signs BP (!) 117/90   Pulse (!) 153   Temp 99.2 F (37.3 C) (Oral)   Resp (!) 22   Ht '5\' 6"'$  (1.676 m)   Wt 72.6 kg   SpO2 100%   BMI 25.82 kg/m   Physical Exam Vitals and nursing note reviewed.  Constitutional:      Appearance: She is well-developed.     Comments: Warm  to touch, flushed appearance  HENT:     Head: Normocephalic and atraumatic.  Eyes:     Conjunctiva/sclera: Conjunctivae normal.     Pupils: Pupils are equal, round, and reactive to light.  Cardiovascular:     Rate and Rhythm: Normal rate and regular rhythm.     Heart sounds: Normal heart sounds.  Pulmonary:     Effort: Pulmonary effort is normal.     Breath sounds: Normal breath sounds.  Abdominal:     General: Bowel sounds are normal.     Palpations: Abdomen is soft.  Musculoskeletal:        General: Normal range of motion.     Cervical back: Normal range of motion.  Skin:    General: Skin is warm and dry.   Neurological:     Mental Status: She is alert and oriented to person, place, and time.     ED Results / Procedures / Treatments   Labs (all labs ordered are listed, but only abnormal results are displayed) Labs Reviewed  RESP PANEL BY RT-PCR (RSV, FLU A&B, COVID)  RVPGX2  RESPIRATORY PANEL BY PCR  URINE CULTURE  CULTURE, BLOOD (ROUTINE X 2)  CULTURE, BLOOD (ROUTINE X 2)  CBC WITH DIFFERENTIAL/PLATELET  LACTIC ACID, PLASMA  LACTIC ACID, PLASMA  COMPREHENSIVE METABOLIC PANEL  TSH  URINALYSIS, ROUTINE W REFLEX MICROSCOPIC  T4, FREE  I-STAT BETA HCG BLOOD, ED ([facility], [facility], [facility] ONLY)  TROPONIN I (HIGH SENSITIVITY)    EKG EKG Interpretation  Date/Time:  Tuesday September 25 2022 22:17:47 EST Ventricular Rate:  140 PR Interval:  139 QRS Duration: 90 QT Interval:  284 QTC Calculation: 434 R Axis:   90 Text Interpretation: Sinus tachycardia Borderline right axis deviation ST elevation, consider inferior injury rate faster than previous Confirmed by Wandra Arthurs [phone]) on 09/25/2022 10:22:15 PM  Radiology No results found.  Procedures Procedures  {Document cardiac monitor, telemetry assessment procedure when appropriate:1}  Medications Ordered in ED Medications  sodium chloride 0.9 % bolus 1,000 mL (has no administration in time range)    ED Course/ Medical Decision Making/ A&P                           Medical Decision Making Amount and/or Complexity of Data Reviewed Labs: ordered. Radiology: ordered.   ***  {Document critical care time when appropriate:1} {Document review of labs and clinical decision tools ie heart score, Chads2Vasc2 etc:1}  {Document your independent review of radiology images, and any outside records:1} {Document your discussion with family members, caretakers, and with consultants:1} {Document social determinants of health affecting pt's care:1} {Document your decision making why or why not admission, treatments were needed:1} Final Clinical  Impression(s) / ED Diagnoses Final diagnoses:  None    Rx / DC Orders ED Discharge Orders     None

## 2022-09-26 ENCOUNTER — Encounter (HOSPITAL_COMMUNITY): Payer: Self-pay | Admitting: Family Medicine

## 2022-09-26 ENCOUNTER — Other Ambulatory Visit: Payer: Self-pay

## 2022-09-26 DIAGNOSIS — N179 Acute kidney failure, unspecified: Secondary | ICD-10-CM | POA: Diagnosis present

## 2022-09-26 DIAGNOSIS — J189 Pneumonia, unspecified organism: Secondary | ICD-10-CM | POA: Diagnosis present

## 2022-09-26 DIAGNOSIS — E232 Diabetes insipidus: Secondary | ICD-10-CM

## 2022-09-26 DIAGNOSIS — E230 Hypopituitarism: Secondary | ICD-10-CM

## 2022-09-26 DIAGNOSIS — R6520 Severe sepsis without septic shock: Secondary | ICD-10-CM | POA: Diagnosis present

## 2022-09-26 DIAGNOSIS — A419 Sepsis, unspecified organism: Secondary | ICD-10-CM

## 2022-09-26 LAB — RESPIRATORY PANEL BY PCR

## 2022-09-26 LAB — CBC
HCT: 40.2 % (ref 36.0–46.0)
Hemoglobin: 12.8 g/dL (ref 12.0–15.0)
MCH: 29.4 pg (ref 26.0–34.0)
MCHC: 31.8 g/dL (ref 30.0–36.0)
MCV: 92.2 fL (ref 80.0–100.0)
Platelets: 183 10*3/uL (ref 150–400)
RBC: 4.36 MIL/uL (ref 3.87–5.11)
RDW: 13.8 % (ref 11.5–15.5)
WBC: 7.7 10*3/uL (ref 4.0–10.5)
nRBC: 0 % (ref 0.0–0.2)

## 2022-09-26 LAB — URINE CULTURE: Culture: NO GROWTH

## 2022-09-26 LAB — URINALYSIS, ROUTINE W REFLEX MICROSCOPIC
Bilirubin Urine: NEGATIVE
Glucose, UA: NEGATIVE mg/dL
Hgb urine dipstick: NEGATIVE
Ketones, ur: NEGATIVE mg/dL
Nitrite: NEGATIVE
Protein, ur: NEGATIVE mg/dL
Specific Gravity, Urine: 1.004 — ABNORMAL LOW (ref 1.005–1.030)
pH: 8 (ref 5.0–8.0)

## 2022-09-26 LAB — COMPREHENSIVE METABOLIC PANEL
ALT: 15 U/L (ref 0–44)
AST: 18 U/L (ref 15–41)
Albumin: 3.2 g/dL — ABNORMAL LOW (ref 3.5–5.0)
Alkaline Phosphatase: 38 U/L (ref 38–126)
Anion gap: 11 (ref 5–15)
BUN: 10 mg/dL (ref 6–20)
CO2: 20 mmol/L — ABNORMAL LOW (ref 22–32)
Calcium: 8.3 mg/dL — ABNORMAL LOW (ref 8.9–10.3)
Chloride: 104 mmol/L (ref 98–111)
Creatinine, Ser: 1.16 mg/dL — ABNORMAL HIGH (ref 0.44–1.00)
GFR, Estimated: >60 mL/min (ref >60)
Glucose, Bld: 83 mg/dL (ref 70–99)
Potassium: 3.6 mmol/L (ref 3.5–5.1)
Sodium: 135 mmol/L (ref 135–145)
Total Bilirubin: 1.1 mg/dL (ref 0.3–1.2)
Total Protein: 6.1 g/dL — ABNORMAL LOW (ref 6.5–8.1)

## 2022-09-26 LAB — BASIC METABOLIC PANEL
Anion gap: 9 (ref 5–15)
BUN: 9 mg/dL (ref 6–20)
CO2: 19 mmol/L — ABNORMAL LOW (ref 22–32)
Calcium: 8.3 mg/dL — ABNORMAL LOW (ref 8.9–10.3)
Chloride: 112 mmol/L — ABNORMAL HIGH (ref 98–111)
Creatinine, Ser: 1.21 mg/dL — ABNORMAL HIGH (ref 0.44–1.00)
GFR, Estimated: 59 mL/min — ABNORMAL LOW (ref >60)
Glucose, Bld: 79 mg/dL (ref 70–99)
Potassium: 3.8 mmol/L (ref 3.5–5.1)
Sodium: 140 mmol/L (ref 135–145)

## 2022-09-26 LAB — T4, FREE: Free T4: 0.58 ng/dL — ABNORMAL LOW (ref 0.61–1.12)

## 2022-09-26 LAB — MAGNESIUM: Magnesium: 1.7 mg/dL (ref 1.7–2.4)

## 2022-09-26 LAB — PROCALCITONIN: Procalcitonin: 0.99 ng/mL

## 2022-09-26 LAB — LACTIC ACID, PLASMA: Lactic Acid, Venous: 1.6 mmol/L (ref 0.5–1.9)

## 2022-09-26 LAB — TROPONIN I (HIGH SENSITIVITY): Troponin I (High Sensitivity): 9 ng/L (ref <18)

## 2022-09-26 LAB — PHOSPHORUS: Phosphorus: 3.5 mg/dL (ref 2.5–4.6)

## 2022-09-26 MED ORDER — ACETAMINOPHEN 650 MG RE SUPP
650.0000 mg | Freq: Four times a day (QID) | RECTAL | Status: DC | PRN
  Administered 2022-09-26: 650 mg via RECTAL
  Filled 2022-09-26: qty 1

## 2022-09-26 MED ORDER — HYDROCORTISONE 5 MG PO TABS: 5.0000 mg | ORAL_TABLET | ORAL | Status: DC

## 2022-09-26 MED ORDER — DESMOPRESSIN ACETATE 0.1 MG PO TABS
0.1000 mg | ORAL_TABLET | Freq: Every day | ORAL | Status: DC
  Administered 2022-09-26 – 2022-09-27 (×2): 0.1 mg via ORAL
  Filled 2022-09-26 (×4): qty 1

## 2022-09-26 MED ORDER — LACTATED RINGERS IV SOLN: INTRAVENOUS | Status: DC

## 2022-09-26 MED ORDER — HYDROCORTISONE 5 MG PO TABS
45.0000 mg | ORAL_TABLET | Freq: Every day | ORAL | Status: AC
  Administered 2022-09-26 – 2022-09-28 (×3): 45 mg via ORAL
  Filled 2022-09-26 (×3): qty 1

## 2022-09-26 MED ORDER — SODIUM CHLORIDE 0.9 % IV SOLN
2.0000 g | INTRAVENOUS | Status: DC
  Administered 2022-09-26 – 2022-09-28 (×3): 2 g via INTRAVENOUS
  Filled 2022-09-26 (×3): qty 20

## 2022-09-26 MED ORDER — SODIUM CHLORIDE 0.9 % IV SOLN: 12.5000 mg | Freq: Four times a day (QID) | INTRAVENOUS | Status: DC | PRN

## 2022-09-26 MED ORDER — ONDANSETRON HCL 4 MG/2ML IJ SOLN
4.0000 mg | Freq: Four times a day (QID) | INTRAMUSCULAR | Status: DC | PRN
  Administered 2022-09-26 – 2022-09-27 (×2): 4 mg via INTRAVENOUS
  Filled 2022-09-26 (×3): qty 2

## 2022-09-26 MED ORDER — ACETAMINOPHEN 325 MG PO TABS
650.0000 mg | ORAL_TABLET | Freq: Four times a day (QID) | ORAL | Status: DC | PRN
  Administered 2022-09-27 – 2022-09-28 (×3): 650 mg via ORAL
  Filled 2022-09-26 (×3): qty 2

## 2022-09-26 MED ORDER — ENOXAPARIN SODIUM 40 MG/0.4ML IJ SOSY
40.0000 mg | PREFILLED_SYRINGE | INTRAMUSCULAR | Status: DC
  Administered 2022-09-26 – 2022-09-27 (×2): 40 mg via SUBCUTANEOUS
  Filled 2022-09-26 (×2): qty 0.4

## 2022-09-26 MED ORDER — ONDANSETRON HCL 4 MG/2ML IJ SOLN
4.0000 mg | Freq: Once | INTRAMUSCULAR | Status: AC
  Administered 2022-09-26: 4 mg via INTRAVENOUS
  Filled 2022-09-26: qty 2

## 2022-09-26 MED ORDER — SODIUM CHLORIDE 0.9 % IV SOLN: INTRAVENOUS | Status: AC

## 2022-09-26 MED ORDER — OXYCODONE HCL 5 MG PO TABS: 5.0000 mg | ORAL_TABLET | ORAL | Status: DC | PRN

## 2022-09-26 MED ORDER — SENNOSIDES-DOCUSATE SODIUM 8.6-50 MG PO TABS: 1.0000 | ORAL_TABLET | Freq: Every evening | ORAL | Status: DC | PRN

## 2022-09-26 MED ORDER — SODIUM CHLORIDE 0.9% FLUSH
3.0000 mL | Freq: Two times a day (BID) | INTRAVENOUS | Status: DC
  Administered 2022-09-26 – 2022-09-27 (×3): 3 mL via INTRAVENOUS

## 2022-09-26 MED ORDER — HYDROCORTISONE 5 MG PO TABS
15.0000 mg | ORAL_TABLET | Freq: Every day | ORAL | Status: AC
  Administered 2022-09-26 – 2022-09-27 (×3): 15 mg via ORAL
  Filled 2022-09-26 (×4): qty 1

## 2022-09-26 MED ORDER — ONDANSETRON HCL 4 MG PO TABS: 4.0000 mg | ORAL_TABLET | Freq: Four times a day (QID) | ORAL | Status: DC | PRN

## 2022-09-26 MED ORDER — AZITHROMYCIN 250 MG PO TABS
500.0000 mg | ORAL_TABLET | Freq: Every day | ORAL | Status: DC
  Administered 2022-09-26 – 2022-09-28 (×3): 500 mg via ORAL
  Filled 2022-09-26 (×3): qty 2

## 2022-09-26 NOTE — ED Notes (Signed)
Multiple times tonight pt removed self from monitor and purewick. This RN explained to pt with the use of interpreter that it's important to use call bell if she needs anything. Pt understood. Pt's bed linens changed, purewick back in place, and pt back on monitor at this time.

## 2022-09-26 NOTE — Progress Notes (Signed)
  Progress Note   Patient: Sara Mathis WLS:[id number] DOB: [birth year] DOA: 09/25/2022     0 DOS: the patient was seen and examined on 09/26/2022   Brief hospital course: 37 y.o. female with medical history significant for migraines, Graves' disease status post thyroidectomy, panhypopituitarism, and central diabetes insipidus, presenting to the emergency department with shortness of breath, headache, nausea, fever, and confusion.   Patient reports 4 days of headache, chills, nausea, and general malaise.  Her family states that she seemed to be confused on 09/24/2022.  Headache is generalized and similar to her prior headaches.  She denies neck stiffness.  Assessment and Plan: 1. Severe sepsis secondary to PNA present on admit - She appears to have developing pneumonia with multiple SIRS criteria and AKI  - Blood cultures were collected in ED, IVF bolus was given, and she was started on Rocephin and azithromycin  - Continue Rocephin and azithromycin -follow up cultures   2. Acute toxic metabolic encephalopathy  - Family raised concern that patient seemed confused on 09/24/22   - No acute findings on head CT at presentation -alert and oriented when seen, conversing appropriately   3. AKI  - SCr is 1.16 on admission, up from baseline of ~0.6  - Likely prerenal in setting of acute illness with N/V  -Cr up to 1.21 this AM, continue IVF as tolerated   4. Panhypopituitarism  - Continue hydrocortisone with dose increase given her acute illness, hold levothyroxine and follow-up free T4 level     5. Central diabetes insipidus  - Continue desmopressin  -Na stable and normal       Subjective: Complaining of mild headache this AM  Physical Exam: Vitals:   09/26/22 1153 09/26/22 1200 09/26/22 1215 09/26/22 1552  BP: (!) 123/103 114/76 114/89 (!) 104/92  Pulse: (!) 120 (!) 139 (!) 132 (!) 114  Resp: (!) 23   18  Temp: 98.7 F (37.1 C)   (!) 100.8 F (38.2 C)  TempSrc: Oral    Oral  SpO2: 100% 92% 98% 100%  Weight:    69.6 kg  Height:    '5\' 7"'$  (1.702 m)   General exam: Awake, laying in bed, in nad Respiratory system: Normal respiratory effort, no wheezing Cardiovascular system: regular rate, s1, s2 Gastrointestinal system: Soft, nondistended, positive BS Central nervous system: CN2-12 grossly intact, strength intact Extremities: Perfused, no clubbing Skin: Normal skin turgor, no notable skin lesions seen Psychiatry: Mood normal // no visual hallucinations   Data Reviewed:  Labs reviewed: Na 140, K 3.8, Cr 1.21  Family Communication: Pt in room, family not at bedside  Disposition: Status is: Inpatient Remains inpatient appropriate because: Severity of illness  Planned Discharge Destination: Home    Author: Marylu Lund, MD 09/26/2022 5:53 PM  For on call review [email or web address].

## 2022-09-26 NOTE — ED Notes (Signed)
Pt's friend Verdis Frederickson) would like to be contacted with any changes in pt's care (phone in chart).

## 2022-09-26 NOTE — Plan of Care (Signed)
  Problem: Activity: Goal: Ability to tolerate increased activity will improve Outcome: Progressing   Problem: Clinical Measurements: Goal: Will remain free from infection Outcome: Progressing   Problem: Activity: Goal: Ability to tolerate increased activity will improve Outcome: Progressing

## 2022-09-26 NOTE — Hospital Course (Signed)
37 y.o. female with medical history significant for migraines, Graves' disease status post thyroidectomy, panhypopituitarism, and central diabetes insipidus, presenting to the emergency department with shortness of breath, headache, nausea, fever, and confusion.   Patient reports 4 days of headache, chills, nausea, and general malaise.  Her family states that she seemed to be confused on 09/24/2022.  Headache is generalized and similar to her prior headaches.  She denies neck stiffness.

## 2022-09-26 NOTE — Progress Notes (Signed)
Admission assessment completed using hospital based tele-interpreter.

## 2022-09-26 NOTE — ED Notes (Signed)
Pt experiencing green bile emesis. This RN will notify admit MD.  Pt continues to request water, this RN explained to pt that every time she drinks water, she throws it up so I will hold her NPO at this time.

## 2022-09-26 NOTE — H&P (Signed)
History and Physical    Sara Mathis YTK:[id number] DOB: 1986-08-01 DOA: 09/25/2022  PCP: [name], MD   Patient coming from: home   Chief Complaint: fevers, SOB, headache, nausea, confusion   HPI: Sara Mathis is a 37 y.o. female with medical history significant for migraines, Graves' disease status post thyroidectomy, panhypopituitarism, and central diabetes insipidus, presenting to the emergency department with shortness of breath, headache, nausea, fever, and confusion.  Patient reports 4 days of headache, chills, nausea, and general malaise.  Her family states that she seemed to be confused on 09/24/2022.  Headache is generalized and similar to her prior headaches.  She denies neck stiffness.  ED Course: Upon arrival to the ED, patient is found to be febrile to 39.9 C and saturating well on room air with mild tachypnea, tachycardia to the 160s, and stable blood pressure.  EKG demonstrates sinus tachycardia 362.  Head CT is negative for acute intracranial abnormality.  Chest x-ray demonstrates hazy opacity over the lower left lung concerning for pneumonia.  Labs are most notable for creatinine 1.16, lactic acid 2.3, normal WBC, TSH <0.010, and free T4 0.58.  Blood and urine cultures were collected in the emergency department and the patient was treated with 2 L of saline, Rocephin, azithromycin, Zofran, and acetaminophen.  Review of Systems:  All other systems reviewed and apart from HPI, are negative.  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 She has never been exposed to tobacco smoke. She has never used smokeless tobacco. She reports that she does not currently use alcohol. She reports that she does not use drugs.  No Known Allergies  Family History  Problem Relation Age of Onset   Diabetes Mother    Fibromyalgia Mother    Heart disease Mother        age 50 and 22   Diabetes Father    ADD / ADHD Brother      Prior to Admission medications   Medication Sig Start Date End Date Taking? Authorizing Provider  cholecalciferol (VITAMIN D3) 25 MCG (1000 UNIT) tablet Take 1,000 Units by mouth daily.    [provider]  desmopressin (DDAVP) 0.1 MG tablet Take 0.5 tablets (0.05 mg total) by mouth at bedtime. Patient taking differently: Take 0.1 mg by mouth at bedtime. 07/24/22   Thurnell Lose, MD  hydrocortisone (CORTEF) 10 MG tablet Take 1 tablet (10 mg total) by mouth 2 (two) times daily. Patient taking differently: Take 5-15 mg by mouth See admin instructions. Take 1.5 tablet by mouth in the morning, then 0.5 tablet by mouth in the evening 07/24/22   Thurnell Lose, MD  hydrocortisone (PROCTOZONE-HC) 2.5 % rectal cream Place 1 Application rectally 2 (two) times daily. 08/16/22   [name], MD  Magnesium 200 MG TABS Take 1 tablet by mouth daily.    [provider]  ondansetron (ZOFRAN-ODT) 4 MG disintegrating tablet Take 1 tablet (4 mg total) by mouth every 8 (eight) hours as needed for nausea or vomiting. 09/10/22  Darliss Cheney, MD  Potassium 99 MG TABS Take 1 tablet by mouth daily.    [provider]  topiramate (TOPAMAX) 25 MG tablet 1 tab by mouth at bedtime Patient taking differently: Take 25 mg by mouth at bedtime. 07/17/22   [name], MD  vitamin C (ASCORBIC ACID) 250 MG tablet Take 250 mg by mouth daily.    [provider]    Physical Exam: Vitals:   09/25/22 2330 09/25/22 2345 09/25/22 2350 09/26/22 0015  BP: 101/87  103/72    Pulse: (!) 125 (!) 127 (!) 129 (!) 132  Resp: (!) 24 (!) 23 (!) 22 (!) 23  Temp:      TempSrc:      SpO2: 97% 98% 97% 99%  Weight:      Height:        Constitutional: NAD, calm  Eyes: PERTLA, lids and conjunctivae normal ENMT: Mucous membranes are moist. Posterior pharynx clear of any exudate or lesions.   Neck: supple, no masses  Respiratory: no wheezing, no crackles. No accessory muscle use.  Cardiovascular: S1 & S2 heard, regular rate and rhythm. No extremity edema.   Abdomen: No distension, no tenderness, soft. Bowel sounds active.  Musculoskeletal: no clubbing / cyanosis. No joint deformity upper and lower extremities.   Skin: no significant rashes, lesions, ulcers. Warm, dry, well-perfused. Neurologic: CN 2-12 grossly intact. Sensation intact, DTR normal. Strength 5/5 in all 4 limbs. Alert and oriented.  Psychiatric: Pleasant. Cooperative.    Labs and Imaging on Admission: I have personally reviewed following labs and imaging studies  CBC: Recent Labs  Lab 09/25/22 2222  WBC 7.2  NEUTROABS 2.5  HGB 14.9  HCT 47.2*  MCV 91.3  PLT 035   Basic Metabolic Panel: Recent Labs  Lab 09/26/22 0011  NA 135  K 3.6  CL 104  CO2 20*  GLUCOSE 83  BUN 10  CREATININE 1.16*  CALCIUM 8.3*   GFR: Estimated Creatinine Clearance: 67.1 mL/min (A) (by C-G formula based on SCr of 1.16 mg/dL (H)). Liver Function Tests: Recent Labs  Lab 09/26/22 0011  AST 18  ALT 15  ALKPHOS 38  BILITOT 1.1  PROT 6.1*  ALBUMIN 3.2*   No results for input(s): "LIPASE", "AMYLASE" in the last 168 hours. No results for input(s): "AMMONIA" in the last 168 hours. Coagulation Profile: No results for input(s): "INR", "PROTIME" in the last 168 hours. Cardiac Enzymes: No results for input(s): "CKTOTAL", "CKMB", "CKMBINDEX", "TROPONINI" in the last 168 hours. BNP (last 3 results) No results for input(s): "PROBNP" in the last 8760 hours. HbA1C: No results for input(s): "HGBA1C" in the  last 72 hours. CBG: No results for input(s): "GLUCAP" in the last 168 hours. Lipid Profile: No results for input(s): "CHOL", "HDL", "LDLCALC", "TRIG", "CHOLHDL", "LDLDIRECT" in the last 72 hours. Thyroid Function Tests: Recent Labs    09/25/22 2222 09/26/22 0011  TSH <0.010*  --   FREET4  --  0.58*   Anemia Panel: No results for input(s): "VITAMINB12", "FOLATE", "FERRITIN", "TIBC", "IRON", "RETICCTPCT" in the last 72 hours. Urine analysis:    Component Value Date/Time   COLORURINE YELLOW 09/26/2022 0000   APPEARANCEUR HAZY (A) 09/26/2022 0000   LABSPEC 1.004 (L) 09/26/2022 0000   PHURINE 8.0 09/26/2022 0000   GLUCOSEU NEGATIVE 09/26/2022 0000   HGBUR NEGATIVE 09/26/2022 0000   BILIRUBINUR NEGATIVE 09/26/2022 0000   KETONESUR NEGATIVE 09/26/2022 0000   PROTEINUR NEGATIVE 09/26/2022 0000   NITRITE NEGATIVE 09/26/2022 0000   LEUKOCYTESUR MODERATE (A)  09/26/2022 0000   Sepsis Labs: '@LABRCNTIP'$ (procalcitonin:4,lacticidven:4) ) Recent Results (from the past 240 hour(s))  Blood culture (routine x 2)     Status: None (Preliminary result)   Collection Time: 09/25/22  8:19 PM   Specimen: BLOOD  Result Value Ref Range Status   Specimen Description BLOOD LEFT ANTECUBITAL  Final   Special Requests   Final    BOTTLES DRAWN AEROBIC AND ANAEROBIC Blood Culture adequate volume Performed at [facility], [place] [address], [place], [place] [address]    Culture PENDING  Incomplete   Report Status PENDING  Incomplete  Resp panel by RT-PCR (RSV, Flu A&B, Covid) Anterior Nasal Swab     Status: None   Collection Time: 09/25/22 10:22 PM   Specimen: Anterior Nasal Swab  Result Value Ref Range Status   SARS Coronavirus 2 by RT PCR NEGATIVE NEGATIVE Final    Comment: (NOTE) SARS-CoV-2 target nucleic acids are NOT DETECTED.  The SARS-CoV-2 RNA is generally detectable in upper respiratory specimens during the acute phase of infection. The lowest concentration of SARS-CoV-2 viral copies  this assay can detect is 138 copies/mL. A negative result does not preclude SARS-Cov-2 infection and should not be used as the sole basis for treatment or other patient management decisions. A negative result may occur with  improper specimen collection/handling, submission of specimen other than nasopharyngeal swab, presence of viral mutation(s) within the areas targeted by this assay, and inadequate number of viral copies(<138 copies/mL). A negative result must be combined with clinical observations, patient history, and epidemiological information. The expected result is Negative.  Fact Sheet for Patients:  EntrepreneurPulse.com.au  Fact Sheet for Healthcare Providers:  IncredibleEmployment.be  This test is no t yet approved or cleared by the [place] FDA and  has been authorized for detection and/or diagnosis of SARS-CoV-2 by FDA under an Emergency Use Authorization (EUA). This EUA will remain  in effect (meaning this test can be used) for the duration of the COVID-19 declaration under Section 564(b)(1) of the Act, 21 U.S.C.section 360bbb-3(b)(1), unless the authorization is terminated  or revoked sooner.       Influenza A by PCR NEGATIVE NEGATIVE Final   Influenza B by PCR NEGATIVE NEGATIVE Final    Comment: (NOTE) The Xpert Xpress SARS-CoV-2/FLU/RSV plus assay is intended as an aid in the diagnosis of influenza from Nasopharyngeal swab specimens and should not be used as a sole basis for treatment. Nasal washings and aspirates are unacceptable for Xpert Xpress SARS-CoV-2/FLU/RSV testing.  Fact Sheet for Patients: EntrepreneurPulse.com.au  Fact Sheet for Healthcare Providers: IncredibleEmployment.be  This test is not yet approved or cleared by the [place] FDA and has been authorized for detection and/or diagnosis of SARS-CoV-2 by FDA under an Emergency Use Authorization (EUA). This EUA  will remain in effect (meaning this test can be used) for the duration of the COVID-19 declaration under Section 564(b)(1) of the Act, 21 U.S.C. section 360bbb-3(b)(1), unless the authorization is terminated or revoked.     Resp Syncytial Virus by PCR NEGATIVE NEGATIVE Final    Comment: (NOTE) Fact Sheet for Patients: EntrepreneurPulse.com.au  Fact Sheet for Healthcare Providers: IncredibleEmployment.be  This test is not yet approved or cleared by the [place] FDA and has been authorized for detection and/or diagnosis of SARS-CoV-2 by FDA under an Emergency Use Authorization (EUA). This EUA will remain in effect (meaning this test can be used) for the duration of the COVID-19 declaration under Section 564(b)(1) of the Act, 21 U.S.C. section 360bbb-3(b)(1),  unless the authorization is terminated or revoked.  Performed at [facility], [place] [address] [place], [place] [address]   Respiratory (~20 pathogens) panel by PCR     Status: None   Collection Time: 09/25/22 10:22 PM   Specimen: Anterior Nasal Swab; Respiratory  Result Value Ref Range Status   Adenovirus NOT DETECTED NOT DETECTED Final   Coronavirus 229E NOT DETECTED NOT DETECTED Final    Comment: (NOTE) The Coronavirus on the Respiratory Panel, DOES NOT test for the novel  Coronavirus (2019 nCoV)    Coronavirus HKU1 NOT DETECTED NOT DETECTED Final   Coronavirus NL63 NOT DETECTED NOT DETECTED Final   Coronavirus OC43 NOT DETECTED NOT DETECTED Final   Metapneumovirus NOT DETECTED NOT DETECTED Final   Rhinovirus / Enterovirus NOT DETECTED NOT DETECTED Final   Influenza A NOT DETECTED NOT DETECTED Final   Influenza B NOT DETECTED NOT DETECTED Final   Parainfluenza Virus 1 NOT DETECTED NOT DETECTED Final   Parainfluenza Virus 2 NOT DETECTED NOT DETECTED Final   Parainfluenza Virus 3 NOT DETECTED NOT DETECTED Final   Parainfluenza Virus 4 NOT DETECTED NOT DETECTED Final    Respiratory Syncytial Virus NOT DETECTED NOT DETECTED Final   Bordetella pertussis NOT DETECTED NOT DETECTED Final   Bordetella Parapertussis NOT DETECTED NOT DETECTED Final   Chlamydophila pneumoniae NOT DETECTED NOT DETECTED Final   Mycoplasma pneumoniae NOT DETECTED NOT DETECTED Final    Comment: Performed at [facility], [address] [place], [place] [address]     Radiological Exams on Admission: CT HEAD WO CONTRAST (5MM)  Result Date: 09/25/2022 CLINICAL DATA:  Initial evaluation for delirium. EXAM: CT HEAD WITHOUT CONTRAST TECHNIQUE: Contiguous axial images were obtained from the base of the skull through the vertex without intravenous contrast. RADIATION DOSE REDUCTION: This exam was performed according to the departmental dose-optimization program which includes automated exposure control, adjustment of the mA and/or kV according to patient size and/or use of iterative reconstruction technique. COMPARISON:  Prior CT from 09/06/2022. FINDINGS: Brain: Examination mildly limited by motion and patient positioning. Cerebral volume within normal limits. No acute intracranial hemorrhage. No acute large vessel territory infarct. Cystic sellar/suprasellar mass again noted, stable. No other mass lesion, mass effect, or midline shift. No hydrocephalus or extra-axial fluid collection. Vascular: No abnormal hyperdense vessel. Skull: Scalp soft tissues and calvarium demonstrate no acute finding. Sinuses/Orbits: Globes orbital soft tissues within normal limits. Mild scattered mucoperiosteal thickening present about the ethmoidal air cells and maxillary sinuses. No mastoid effusion. Other: None. IMPRESSION: 1. No acute intracranial abnormality. 2. 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 Electronically Signed   By: Jeannine Boga M.D.   On: 09/25/2022 23:24   DG Chest Port 1 View  Result Date: 09/25/2022 CLINICAL DATA:  Fever EXAM: PORTABLE CHEST 1 VIEW COMPARISON:   09/07/2022 FINDINGS: Hazy opacity over the left lower lung with increased interstitial prominence. No pleural effusion or pneumothorax. Normal cardiomediastinal silhouette. No acute osseous abnormality. IMPRESSION: Hazy opacity over the left lower lung suspicious for pneumonia. Electronically Signed   By: Placido Sou M.D.   On: 09/25/2022 23:17    EKG: Independently reviewed. Sinus tachycardia, rate 162, RAD.   Assessment/Plan   1. Severe sepsis secondary to PNA  - She appears to have developing pneumonia with multiple SIRS criteria and AKI  - Blood cultures were collected in ED, IVF bolus was given, and she was started on Rocephin and azithromycin  - Continue Rocephin and azithromycin, check  strep pneumo and legionella antigens, trend procalcitonin, follow cultures and clinical course   2. Acute encephalopathy  - Family raised concern that patient seemed confused on 09/24/22   - No acute findings on head CT and she is alert and fully oriented by time of admission  - Treat the acute illness, monitor for recurrence   3. AKI  - SCr is 1.16 on admission, up from baseline of ~0.6  - Likely prerenal in setting of acute illness with N/V  - Continue IVF hydration and repeat chem panel in am    4. Panhypopituitarism  - Continue hydrocortisone with dose increase given her acute illness, hold levothyroxine and follow-up free T4 level    5. Central diabetes insipidus  - Continue desmopressin    DVT prophylaxis: Lovenox  Code Status: Full   Level of Care: Level of care: Telemetry Cardiac Family Communication: Family updated from ED   Disposition Plan:  Patient is from: home  Anticipated d/c is to: Home  Anticipated d/c date is: 09/29/22  Patient currently: Pending improved sepsis parameters and renal function  Consults called: none  Admission status: Inpatient     Vianne Bulls, MD [facility]  09/26/2022, 1:30 AM

## 2022-09-26 NOTE — ED Notes (Signed)
ED TO INPATIENT HANDOFF REPORT  ED Nurse Name and Phone #: Sherryll Burger [phone] S Name/Age/Gender Sara Mathis 37 y.o. female Room/Bed: 038C/038C  Code Status   Code Status: Full Code  Home/SNF/Other Home Patient oriented to: self, place, time, and situation Is this baseline? Yes   Triage Complete: Triage complete  Chief Complaint Pneumonia [J18.9]  Triage Note Pt c/o of headache, centralized chest pain, n/v, blurry vision shortness of breath starting yesterday. Per family, pt has been confused and not making sense since yesterday. Pt oriented to self, time, and place at this time.      Allergies No Known Allergies  Level of Care/Admitting Diagnosis ED Disposition     ED Disposition  Admit   Condition  --   Comment  Hospital Area: 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   Thyroid disease 02/21/2015   Grave's disease resulting in total thyroidectomy   Past Surgical History:  Procedure Laterality Date   THERAPEUTIC ABORTION  06/2020   At 6 weeks--oral meds did not work   THYROIDECTOMY Bilateral 02/21/2015   For Grave's Disease not well controlled on medication     A IV Location/Drains/Wounds Patient Lines/Drains/Airways Status     Active Line/Drains/Airways     Name Placement date Placement time Site Days   Peripheral IV 09/25/22 20 G Left Antecubital 09/25/22   2224  Antecubital  1            Intake/Output Last 24 hours No intake or output data in the 24 hours ending 09/26/22 1357  Labs/Imaging Results for orders placed or performed during the hospital encounter of 09/25/22 (from the past 48 hour(s))  Blood culture (routine x 2)     Status: None (Preliminary result)   Collection Time: 09/25/22  8:19 PM   Specimen: BLOOD  Result Value Ref Range   Specimen Description BLOOD LEFT ANTECUBITAL    Special Requests      BOTTLES DRAWN AEROBIC AND ANAEROBIC Blood Culture adequate volume   Culture      NO GROWTH < 12 HOURS Performed at [facility], [address], [place], [place] [address]    Report Status PENDING   CBC with Differential     Status: Abnormal   Collection Time: 09/25/22 10:22 PM  Result Value Ref Range   WBC 7.2 4.0 - 10.5 K/uL   RBC 5.17 (H) 3.87 - 5.11 MIL/uL   Hemoglobin 14.9 12.0 - 15.0 g/dL   HCT 47.2 (H) 36.0 - 46.0 %   MCV 91.3 80.0 - 100.0 fL   MCH 28.8 26.0 - 34.0 pg   MCHC 31.6 30.0 - 36.0 g/dL   RDW 13.9 11.5 - 15.5 %   Platelets 243 150 - 400 K/uL   nRBC 0.0 0.0 - 0.2 %   Neutrophils Relative % 35 %   Neutro Abs 2.5 1.7 - 7.7 K/uL   Lymphocytes Relative  54 %   Lymphs Abs 3.8 0.7 - 4.0 K/uL   Monocytes Relative 10 %   Monocytes Absolute 0.8 0.1 - 1.0 K/uL   Eosinophils Relative 1 %   Eosinophils Absolute 0.1 0.0 - 0.5 K/uL   Basophils Relative 0 %   Basophils Absolute 0.0 0.0 - 0.1 K/uL   Immature Granulocytes 0 %   Abs Immature Granulocytes 0.02 0.00 - 0.07 K/uL    Comment: Performed at [facility], [place] [address] [place], [place] [address]  Lactic acid, plasma     Status: Abnormal   Collection Time: 09/25/22 10:22 PM  Result Value Ref Range   Lactic Acid, Venous 2.3 (HH) 0.5 - 1.9 mmol/L    Comment: CRITICAL RESULT CALLED TO, READ BACK BY AND VERIFIED WITH Ebony Hail [facility] RN 09/25/22 2344 Wiliam Ke Performed at [facility], [place] [address] [place], [place] [address]    TSH     Status: Abnormal   Collection Time: 09/25/22 10:22 PM  Result Value Ref Range   TSH <0.010 (L) 0.350 - 4.500 uIU/mL    Comment: Performed by a 3rd Generation assay with a functional sensitivity of <=0.01 uIU/mL. Performed at [facility], [place] [address], [place], [place] [address]   Resp panel by RT-PCR (RSV, Flu A&B, Covid) Anterior Nasal Swab     Status: None   Collection Time: 09/25/22 10:22 PM   Specimen: Anterior Nasal Swab  Result Value Ref Range   SARS Coronavirus 2 by RT PCR NEGATIVE NEGATIVE    Comment: (NOTE) SARS-CoV-2 target nucleic acids are NOT DETECTED.  The SARS-CoV-2 RNA is generally detectable in upper respiratory specimens during the acute phase of infection. The lowest concentration of SARS-CoV-2 viral copies this assay can detect is 138 copies/mL. A negative result does not preclude SARS-Cov-2 infection and should not be used as the sole basis for treatment or other patient management decisions. A negative result may occur with  improper specimen collection/handling, submission of specimen other than nasopharyngeal swab, presence of viral mutation(s) within the areas targeted by this assay, and inadequate number of viral copies(<138 copies/mL). A negative result must be combined with clinical observations, patient history, and epidemiological information. The expected result is Negative.  Fact Sheet for Patients:  EntrepreneurPulse.com.au  Fact Sheet for Healthcare Providers:  IncredibleEmployment.be  This test is no t yet approved or cleared by the [place] FDA and  has been authorized for detection and/or diagnosis of SARS-CoV-2 by FDA under an Emergency Use Authorization (EUA). This EUA will remain  in effect (meaning this test can be used) for the duration of the COVID-19 declaration under Section 564(b)(1) of the Act, 21 U.S.C.section 360bbb-3(b)(1), unless the authorization is terminated  or  revoked sooner.       Influenza A by PCR NEGATIVE NEGATIVE   Influenza B by PCR NEGATIVE NEGATIVE    Comment: (NOTE) The Xpert Xpress SARS-CoV-2/FLU/RSV plus assay is intended as an aid in the diagnosis of influenza from Nasopharyngeal swab specimens and should not be used as a sole basis for treatment. Nasal washings and aspirates are unacceptable for Xpert Xpress SARS-CoV-2/FLU/RSV testing.  Fact Sheet for Patients: EntrepreneurPulse.com.au  Fact Sheet for Healthcare Providers: IncredibleEmployment.be  This test is not yet approved or cleared by the [place] FDA and has been authorized for detection and/or diagnosis of SARS-CoV-2 by FDA under an Emergency Use Authorization (EUA). This EUA will remain in effect (meaning this test can be used) for the duration  of the COVID-19 declaration under Section 564(b)(1) of the Act, 21 U.S.C. section 360bbb-3(b)(1), unless the authorization is terminated or revoked.     Resp Syncytial Virus by PCR NEGATIVE NEGATIVE    Comment: (NOTE) Fact Sheet for Patients: EntrepreneurPulse.com.au  Fact Sheet for Healthcare Providers: IncredibleEmployment.be  This test is not yet approved or cleared by the [place] FDA and has been authorized for detection and/or diagnosis of SARS-CoV-2 by FDA under an Emergency Use Authorization (EUA). This EUA will remain in effect (meaning this test can be used) for the duration of the COVID-19 declaration under Section 564(b)(1) of the Act, 21 U.S.C. section 360bbb-3(b)(1), unless the authorization is terminated or revoked.  Performed at [facility], [place] [address] [place], [place] [address]   Respiratory (~20 pathogens) panel by PCR     Status: None   Collection Time: 09/25/22 10:22 PM   Specimen: Anterior Nasal Swab; Respiratory  Result Value Ref Range   Adenovirus NOT DETECTED NOT DETECTED   Coronavirus 229E NOT  DETECTED NOT DETECTED    Comment: (NOTE) The Coronavirus on the Respiratory Panel, DOES NOT test for the novel  Coronavirus (2019 nCoV)    Coronavirus HKU1 NOT DETECTED NOT DETECTED   Coronavirus NL63 NOT DETECTED NOT DETECTED   Coronavirus OC43 NOT DETECTED NOT DETECTED   Metapneumovirus NOT DETECTED NOT DETECTED   Rhinovirus / Enterovirus NOT DETECTED NOT DETECTED   Influenza A NOT DETECTED NOT DETECTED   Influenza B NOT DETECTED NOT DETECTED   Parainfluenza Virus 1 NOT DETECTED NOT DETECTED   Parainfluenza Virus 2 NOT DETECTED NOT DETECTED   Parainfluenza Virus 3 NOT DETECTED NOT DETECTED   Parainfluenza Virus 4 NOT DETECTED NOT DETECTED   Respiratory Syncytial Virus NOT DETECTED NOT DETECTED   Bordetella pertussis NOT DETECTED NOT DETECTED   Bordetella Parapertussis NOT DETECTED NOT DETECTED   Chlamydophila pneumoniae NOT DETECTED NOT DETECTED   Mycoplasma pneumoniae NOT DETECTED NOT DETECTED    Comment: Performed at [facility], [place] [address], [place], [place] [address]  Troponin I (High Sensitivity)     Status: None   Collection Time: 09/25/22 10:22 PM  Result Value Ref Range   Troponin I (High Sensitivity) 5 <18 ng/L    Comment: (NOTE) Elevated high sensitivity troponin I (hsTnI) values and significant  changes across serial measurements may suggest ACS but many other  chronic and acute conditions are known to elevate hsTnI results.  Refer to the "Links" section for chest pain algorithms and additional  guidance. Performed at [facility], [place] [address], [place], [place] [address]   I-Stat Beta hCG blood, ED ([facility], [facility], [facility] only)     Status: None   Collection Time: 09/25/22 10:34 PM  Result Value Ref Range   I-stat hCG, quantitative <5.0 <5 mIU/mL   Comment 3            Comment:   GEST. AGE      CONC.  (mIU/mL)   <=1 WEEK        5 - 50     2 WEEKS       50 - 500     3 WEEKS       100 - 10,000     4 WEEKS     1,000 - 30,000        FEMALE AND NON-PREGNANT  FEMALE:     LESS THAN 5 mIU/mL   Blood culture (routine x 2)  Status: None (Preliminary result)   Collection Time: 09/25/22 10:43 PM   Specimen: BLOOD LEFT HAND  Result Value Ref Range   Specimen Description BLOOD LEFT HAND    Special Requests      BOTTLES DRAWN AEROBIC AND ANAEROBIC Blood Culture results may not be optimal due to an inadequate volume of blood received in culture bottles   Culture      NO GROWTH < 12 HOURS Performed at [facility] [address] [place], [place] [address]    Report Status PENDING   Urinalysis, Routine w reflex microscopic Urine, Clean Catch     Status: Abnormal   Collection Time: 09/26/22 12:00 AM  Result Value Ref Range   Color, Urine YELLOW YELLOW   APPearance HAZY (A) CLEAR   Specific Gravity, Urine 1.004 (L) 1.005 - 1.030   pH 8.0 5.0 - 8.0   Glucose, UA NEGATIVE NEGATIVE mg/dL   Hgb urine dipstick NEGATIVE NEGATIVE   Bilirubin Urine NEGATIVE NEGATIVE   Ketones, ur NEGATIVE NEGATIVE mg/dL   Protein, ur NEGATIVE NEGATIVE mg/dL   Nitrite NEGATIVE NEGATIVE   Leukocytes,Ua MODERATE (A) NEGATIVE   RBC / HPF 0-5 0 - 5 RBC/hpf   WBC, UA 11-20 0 - 5 WBC/hpf   Bacteria, UA FEW (A) NONE SEEN   Squamous Epithelial / HPF 11-20 0 - 5 /HPF    Comment: Performed at [facility], [place] [address], [place], [place] [address]  Lactic acid, plasma     Status: None   Collection Time: 09/26/22 12:11 AM  Result Value Ref Range   Lactic Acid, Venous 1.6 0.5 - 1.9 mmol/L    Comment: Performed at [facility] [address] [place], [place] [address]  Comprehensive metabolic panel     Status: Abnormal   Collection Time: 09/26/22 12:11 AM  Result Value Ref Range   Sodium 135 135 - 145 mmol/L   Potassium 3.6 3.5 - 5.1 mmol/L   Chloride 104 98 - 111 mmol/L   CO2 20 (L) 22 - 32 mmol/L   Glucose, Bld 83 70 - 99 mg/dL    Comment: Glucose reference range applies only to samples taken after fasting for at least 8 hours.   BUN 10 6 - 20  mg/dL   Creatinine, Ser 1.16 (H) 0.44 - 1.00 mg/dL   Calcium 8.3 (L) 8.9 - 10.3 mg/dL   Total Protein 6.1 (L) 6.5 - 8.1 g/dL   Albumin 3.2 (L) 3.5 - 5.0 g/dL   AST 18 15 - 41 U/L   ALT 15 0 - 44 U/L   Alkaline Phosphatase 38 38 - 126 U/L   Total Bilirubin 1.1 0.3 - 1.2 mg/dL   GFR, Estimated >60 >60 mL/min    Comment: (NOTE) Calculated using the CKD-EPI Creatinine Equation (2021)    Anion gap 11 5 - 15    Comment: Performed at [facility], [place] [address] [place], [place] [address]  T4, free     Status: Abnormal   Collection Time: 09/26/22 12:11 AM  Result Value Ref Range   Free T4 0.58 (L) 0.61 - 1.12 ng/dL    Comment: (NOTE) Biotin ingestion may interfere with free T4 tests. If the results are inconsistent with the TSH level, previous test results, or the clinical presentation, then consider biotin interference. If needed, order repeat testing after stopping biotin. Performed at [facility], [place] [address] [place], [place] [address]   Troponin I (High Sensitivity)  Status: None   Collection Time: 09/26/22 12:11 AM  Result Value Ref Range   Troponin I (High Sensitivity) 9 <18 ng/L    Comment: (NOTE) Elevated high sensitivity troponin I (hsTnI) values and significant  changes across serial measurements may suggest ACS but many other  chronic and acute conditions are known to elevate hsTnI results.  Refer to the "Links" section for chest pain algorithms and additional  guidance. Performed at [facility], [place] [address] [place],  [address]   Procalcitonin - Baseline     Status: None   Collection Time: 09/26/22 12:11 AM  Result Value Ref Range   Procalcitonin 0.99 ng/mL    Comment:        Interpretation: PCT > 0.5 ng/mL and <= 2 ng/mL: Systemic infection (sepsis) is possible, but other conditions are known to elevate PCT as well. (NOTE)       Sepsis PCT Algorithm           Lower Respiratory Tract                                       Infection PCT Algorithm    ----------------------------     ----------------------------         PCT < 0.25 ng/mL                PCT < 0.10 ng/mL          Strongly encourage             Strongly discourage   discontinuation of antibiotics    initiation of antibiotics    ----------------------------     -----------------------------       PCT 0.25 - 0.50 ng/mL            PCT 0.10 - 0.25 ng/mL               OR       >80% decrease in PCT            Discourage initiation of                                            antibiotics      Encourage discontinuation           of antibiotics    ----------------------------     -----------------------------         PCT >= 0.50 ng/mL              PCT 0.26 - 0.50 ng/mL                AND       <80% decrease in PCT             Encourage initiation of                                             antibiotics       Encourage continuation           of antibiotics    ----------------------------     -----------------------------        PCT >= 0.50 ng/mL  PCT > 0.50 ng/mL               AND         increase in PCT                  Strongly encourage                                      initiation of antibiotics    Strongly encourage escalation           of antibiotics                                     -----------------------------                                           PCT <= 0.25 ng/mL                                                 OR                                        > 80% decrease in PCT                                      Discontinue / Do not initiate                                             antibiotics  Performed at [facility], [address], [place], [place] [address]   Basic metabolic panel     Status: Abnormal   Collection Time: 09/26/22  3:25 AM  Result Value Ref Range   Sodium 140 135 - 145 mmol/L   Potassium 3.8 3.5 - 5.1 mmol/L   Chloride 112 (H) 98 - 111 mmol/L   CO2 19 (L) 22 - 32 mmol/L    Glucose, Bld 79 70 - 99 mg/dL    Comment: Glucose reference range applies only to samples taken after fasting for at least 8 hours.   BUN 9 6 - 20 mg/dL   Creatinine, Ser 1.21 (H) 0.44 - 1.00 mg/dL   Calcium 8.3 (L) 8.9 - 10.3 mg/dL   GFR, Estimated 59 (L) >60 mL/min    Comment: (NOTE) Calculated using the CKD-EPI Creatinine Equation (2021)    Anion gap 9 5 - 15    Comment: Performed at [facility] [address], [place], [place] [address]  Magnesium     Status: None   Collection Time: 09/26/22  3:25 AM  Result Value Ref Range   Magnesium 1.7 1.7 - 2.4 mg/dL    Comment: Performed at [facility], [place] [address], [place], [place] [address]  Phosphorus     Status: None  Collection Time: 09/26/22  3:25 AM  Result Value Ref Range   Phosphorus 3.5 2.5 - 4.6 mg/dL    Comment: Performed at [facility], [place] [address], [place] [address]  CBC     Status: None   Collection Time: 09/26/22  3:25 AM  Result Value Ref Range   WBC 7.7 4.0 - 10.5 K/uL   RBC 4.36 3.87 - 5.11 MIL/uL   Hemoglobin 12.8 12.0 - 15.0 g/dL   HCT 40.2 36.0 - 46.0 %   MCV 92.2 80.0 - 100.0 fL   MCH 29.4 26.0 - 34.0 pg   MCHC 31.8 30.0 - 36.0 g/dL   RDW 13.8 11.5 - 15.5 %   Platelets 183 150 - 400 K/uL   nRBC 0.0 0.0 - 0.2 %    Comment: Performed at [facility], [place] [address] [place], [place] [address]   CT HEAD WO CONTRAST (5MM)  Result Date: 09/25/2022 CLINICAL DATA:  Initial evaluation for delirium. EXAM: CT HEAD WITHOUT CONTRAST TECHNIQUE: Contiguous axial images were obtained from the base of the skull through the vertex without intravenous contrast. RADIATION DOSE REDUCTION: This exam was performed according to the departmental dose-optimization program which includes automated exposure control, adjustment of the mA and/or kV according to patient size and/or use of iterative reconstruction technique. COMPARISON:  Prior CT from 09/06/2022. FINDINGS: Brain: Examination mildly  limited by motion and patient positioning. Cerebral volume within normal limits. No acute intracranial hemorrhage. No acute large vessel territory infarct. Cystic sellar/suprasellar mass again noted, stable. No other mass lesion, mass effect, or midline shift. No hydrocephalus or extra-axial fluid collection. Vascular: No abnormal hyperdense vessel. Skull: Scalp soft tissues and calvarium demonstrate no acute finding. Sinuses/Orbits: Globes orbital soft tissues within normal limits. Mild scattered mucoperiosteal thickening present about the ethmoidal air cells and maxillary sinuses. No mastoid effusion. Other: None. IMPRESSION: 1. No acute intracranial abnormality. 2. 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 Electronically Signed   By: Jeannine Boga M.D.   On: 09/25/2022 23:24   DG Chest Port 1 View  Result Date: 09/25/2022 CLINICAL DATA:  Fever EXAM: PORTABLE CHEST 1 VIEW COMPARISON:  09/07/2022 FINDINGS: Hazy opacity over the left lower lung with increased interstitial prominence. No pleural effusion or pneumothorax. Normal cardiomediastinal silhouette. No acute osseous abnormality. IMPRESSION: Hazy opacity over the left lower lung suspicious for pneumonia. Electronically Signed   By: Placido Sou M.D.   On: 09/25/2022 23:17    Pending Labs Unresulted Labs (From admission, onward)     Start     Ordered   10/03/22 0500  Creatinine, serum  (enoxaparin (LOVENOX)    CrCl >/= 30 ml/min)  Weekly,   R     Comments: while on enoxaparin therapy    09/26/22 0130   09/26/22 0500  Procalcitonin  Daily,   R      09/26/22 0130   09/26/22 4403  Basic metabolic panel  Daily,   R      09/26/22 0130   09/26/22 0500  CBC  Daily,   R      09/26/22 0130   09/26/22 0130  Legionella Pneumophila Serogp 1 Ur Ag  (COPD / Pneumonia / Cellulitis / Lower Extremity Wound)  Add-on,   AD        09/26/22 0130   09/26/22 0130  Strep pneumoniae urinary antigen  (COPD / Pneumonia / Cellulitis  / Lower Extremity Wound)  Add-on,   AD  09/26/22 0130   09/25/22 2223  Urine Culture  Once,   URGENT       Question:  Indication  Answer:  Sepsis   09/25/22 2223            Vitals/Pain Today's Vitals   09/26/22 1115 09/26/22 1153 09/26/22 1200 09/26/22 1215  BP:  (!) 123/103 114/76 114/89  Pulse: (!) 128 (!) 120 (!) 139 (!) 132  Resp: (!) 24 (!) 23    Temp:  98.7 F (37.1 C)    TempSrc:  Oral    SpO2: 97% 100% 92% 98%  Weight:      Height:        Isolation Precautions No active isolations  Medications Medications  desmopressin (DDAVP) tablet 0.1 mg (has no administration in time range)  hydrocortisone (CORTEF) tablet 45 mg (45 mg Oral Given 09/26/22 1155)  hydrocortisone (CORTEF) tablet 15 mg (15 mg Oral Given 09/26/22 0323)  enoxaparin (LOVENOX) injection 40 mg (has no administration in time range)  sodium chloride flush (NS) 0.9 % injection 3 mL (3 mLs Intravenous Given 09/26/22 1027)  acetaminophen (TYLENOL) tablet 650 mg ( Oral See Alternative 09/26/22 0525)    Or  acetaminophen (TYLENOL) suppository 650 mg (650 mg Rectal Given 09/26/22 0525)  oxyCODONE (Oxy IR/ROXICODONE) immediate release tablet 5 mg (has no administration in time range)  senna-docusate (Senokot-S) tablet 1 tablet (has no administration in time range)  ondansetron (ZOFRAN) tablet 4 mg (has no administration in time range)    Or  ondansetron (ZOFRAN) injection 4 mg (has no administration in time range)  cefTRIAXone (ROCEPHIN) 2 g in sodium chloride 0.9 % 100 mL IVPB (0 g Intravenous Stopped 09/26/22 1340)  azithromycin (ZITHROMAX) tablet 500 mg (has no administration in time range)  0.9 %  sodium chloride infusion ( Intravenous New Bag/Given 09/26/22 0323)  promethazine (PHENERGAN) 12.5 mg in sodium chloride 0.9 % 50 mL IVPB (has no administration in time range)  lactated ringers infusion (has no administration in time range)  sodium chloride 0.9 % bolus 1,000 mL (0 mLs Intravenous Stopped 09/26/22  0045)  acetaminophen (TYLENOL) suppository 650 mg (650 mg Rectal Given 09/25/22 2318)  azithromycin (ZITHROMAX) 500 mg in sodium chloride 0.9 % 250 mL IVPB (0 mg Intravenous Stopped 09/26/22 0200)  cefTRIAXone (ROCEPHIN) 1 g in sodium chloride 0.9 % 100 mL IVPB (0 g Intravenous Stopped 09/26/22 0045)  sodium chloride 0.9 % bolus 1,000 mL (0 mLs Intravenous Stopped 09/26/22 0449)  ondansetron (ZOFRAN) injection 4 mg (4 mg Intravenous Given 09/26/22 0057)    Mobility walks Moderate fall risk   Focused Assessments    R Recommendations: See Admitting Provider Note  Report given to:   Additional Notes: pt is confused intermittently. Pt pulls off cords when she's confused.

## 2022-09-26 NOTE — ED Notes (Signed)
Pt seen drinking water from faucet in room, this RN explained to pt that it's important she calls me before getting up and that she can't have water d/t inability to keep fluids down. Pt now back in bed with monitor on.

## 2022-09-27 LAB — CBC
HCT: 37.1 % (ref 36.0–46.0)
Hemoglobin: 12.1 g/dL (ref 12.0–15.0)
MCH: 29.4 pg (ref 26.0–34.0)
MCHC: 32.6 g/dL (ref 30.0–36.0)
MCV: 90 fL (ref 80.0–100.0)
Platelets: 152 10*3/uL (ref 150–400)
RBC: 4.12 MIL/uL (ref 3.87–5.11)
RDW: 13.8 % (ref 11.5–15.5)
WBC: 7.7 10*3/uL (ref 4.0–10.5)
nRBC: 0 % (ref 0.0–0.2)

## 2022-09-27 LAB — BASIC METABOLIC PANEL
Anion gap: 10 (ref 5–15)
BUN: 5 mg/dL — ABNORMAL LOW (ref 6–20)
CO2: 20 mmol/L — ABNORMAL LOW (ref 22–32)
Calcium: 8.8 mg/dL — ABNORMAL LOW (ref 8.9–10.3)
Chloride: 105 mmol/L (ref 98–111)
Creatinine, Ser: 0.93 mg/dL (ref 0.44–1.00)
GFR, Estimated: >60 mL/min (ref >60)
Glucose, Bld: 106 mg/dL — ABNORMAL HIGH (ref 70–99)
Potassium: 3.2 mmol/L — ABNORMAL LOW (ref 3.5–5.1)
Sodium: 135 mmol/L (ref 135–145)

## 2022-09-27 LAB — PROCALCITONIN: Procalcitonin: 1.28 ng/mL

## 2022-09-27 LAB — STREP PNEUMONIAE URINARY ANTIGEN: Strep Pneumo Urinary Antigen: NEGATIVE

## 2022-09-27 MED ORDER — POTASSIUM CHLORIDE CRYS ER 20 MEQ PO TBCR
40.0000 meq | EXTENDED_RELEASE_TABLET | ORAL | Status: AC
  Administered 2022-09-27 (×2): 40 meq via ORAL
  Filled 2022-09-27 (×2): qty 2

## 2022-09-27 MED ORDER — PROCHLORPERAZINE EDISYLATE 10 MG/2ML IJ SOLN
10.0000 mg | Freq: Four times a day (QID) | INTRAMUSCULAR | Status: DC | PRN
  Administered 2022-09-27: 10 mg via INTRAVENOUS
  Filled 2022-09-27: qty 2

## 2022-09-27 MED ORDER — LEVOTHYROXINE SODIUM 50 MCG PO TABS
62.5000 ug | ORAL_TABLET | Freq: Every day | ORAL | Status: DC
  Administered 2022-09-27 – 2022-09-28 (×2): 62.5 ug via ORAL
  Filled 2022-09-27 (×2): qty 1

## 2022-09-27 NOTE — Progress Notes (Signed)
  Progress Note   Patient: Sara Mathis OAC:[id number] DOB: Jun 05, 1986 DOA: 09/25/2022     1 DOS: the patient was seen and examined on 09/27/2022   Brief hospital course: 37 y.o. female with medical history significant for migraines, Graves' disease status post thyroidectomy, panhypopituitarism, and central diabetes insipidus, presenting to the emergency department with shortness of breath, headache, nausea, fever, and confusion.   Patient reports 4 days of headache, chills, nausea, and general malaise.  Her family states that she seemed to be confused on 09/24/2022.  Headache is generalized and similar to her prior headaches.  She denies neck stiffness.  Assessment and Plan: 1. Severe sepsis secondary to PNA present on admit - She appears to have developing pneumonia with multiple SIRS criteria and AKI  - Blood cultures were collected in ED, IVF bolus was given, and she was started on Rocephin and azithromycin  - Continue Rocephin and azithromycin -cultures neg thus far -Remains febrile this AM   2. Acute toxic metabolic encephalopathy  - Family raised concern that patient seemed confused on 09/24/22   - No acute findings on head CT at presentation -alert and oriented when seen, conversing appropriately   3. AKI  - SCr is 1.16 on admission, up from baseline of ~0.6  - Likely prerenal in setting of acute illness with N/V  -Cr up to 1.21 this AM, continue IVF as tolerated   4. Panhypopituitarism  - Continue hydrocortisone with dose increase given her acute illness, hold levothyroxine and follow-up free T4 level     5. Central diabetes insipidus  - Continue desmopressin  -Na stable and normal   6. Cystic pituitary lesion -Pt complaining of some L eye vision change, but reports it is essentially unchanged from prior admits even back to 11/23 -Neurosurgery consultation from 11/23 reviewed. At that time, no surgery was recommended unless there was an acute change -CT head  this admit reviewed, lesion essentially unchanged and is stable  7. Hypokalemia -Replaced      Subjective: Complains of blurry vision in L eye, however, pt later reports is essentially unchanged from 11/23  Physical Exam: Vitals:   09/27/22 0840 09/27/22 0928 09/27/22 1113 09/27/22 1621  BP:   106/81 117/86  Pulse:   88 81  Resp:   17 18  Temp: (!) 101 F (38.3 C) 99.2 F (37.3 C) 98.6 F (37 C) 99.5 F (37.5 C)  TempSrc:   Oral Oral  SpO2:   98% 99%  Weight:      Height:       General exam: Conversant, in no acute distress Respiratory system: normal chest rise, clear, no audible wheezing Cardiovascular system: regular rhythm, s1-s2 Gastrointestinal system: Nondistended, nontender, pos BS Central nervous system: No seizures, no tremors Extremities: No cyanosis, no joint deformities Skin: No rashes, no pallor Psychiatry: Affect normal // no auditory hallucinations   Data Reviewed:  Labs reviewed: Na 135, K 3.2, Cr 0.93  Family Communication: Pt in room, family at bedside  Disposition: Status is: Inpatient Remains inpatient appropriate because: Severity of illness  Planned Discharge Destination: Home    Author: Marylu Lund, MD 09/27/2022 7:10 PM  For on call review [email or web address].

## 2022-09-28 ENCOUNTER — Other Ambulatory Visit (HOSPITAL_COMMUNITY): Payer: Self-pay

## 2022-09-28 LAB — BASIC METABOLIC PANEL
Anion gap: 9 (ref 5–15)
BUN: 7 mg/dL (ref 6–20)
CO2: 23 mmol/L (ref 22–32)
Calcium: 8.5 mg/dL — ABNORMAL LOW (ref 8.9–10.3)
Chloride: 107 mmol/L (ref 98–111)
Creatinine, Ser: 0.94 mg/dL (ref 0.44–1.00)
GFR, Estimated: >60 mL/min (ref >60)
Glucose, Bld: 106 mg/dL — ABNORMAL HIGH (ref 70–99)
Potassium: 3.7 mmol/L (ref 3.5–5.1)
Sodium: 139 mmol/L (ref 135–145)

## 2022-09-28 LAB — CBC
HCT: 32.4 % — ABNORMAL LOW (ref 36.0–46.0)
Hemoglobin: 10.3 g/dL — ABNORMAL LOW (ref 12.0–15.0)
MCH: 29.1 pg (ref 26.0–34.0)
MCHC: 31.8 g/dL (ref 30.0–36.0)
MCV: 91.5 fL (ref 80.0–100.0)
Platelets: 136 10*3/uL — ABNORMAL LOW (ref 150–400)
RBC: 3.54 MIL/uL — ABNORMAL LOW (ref 3.87–5.11)
RDW: 13.5 % (ref 11.5–15.5)
WBC: 3.7 10*3/uL — ABNORMAL LOW (ref 4.0–10.5)
nRBC: 0 % (ref 0.0–0.2)

## 2022-09-28 MED ORDER — CEFDINIR 300 MG PO CAPS
300.0000 mg | ORAL_CAPSULE | Freq: Two times a day (BID) | ORAL | 0 refills | Status: AC
  Filled 2022-09-28: qty 6, 3d supply, fill #0

## 2022-09-28 MED ORDER — LEVOTHYROXINE SODIUM 125 MCG PO TABS
62.5000 ug | ORAL_TABLET | Freq: Every day | ORAL | 0 refills | Status: AC
  Filled 2022-09-28: qty 15, 30d supply, fill #0

## 2022-09-28 MED ORDER — AZITHROMYCIN 250 MG PO TABS
250.0000 mg | ORAL_TABLET | Freq: Every day | ORAL | 0 refills | Status: AC
  Filled 2022-09-28: qty 3, 3d supply, fill #0

## 2022-09-28 NOTE — TOC Initial Note (Signed)
Transition of Care [facility]) - Initial/Assessment Note    Patient Details  Name: Sara Mathis MRN: [id number] Date of Birth: [birth year]  [facility] CM/SW Contact:    Zenon Mayo, RN Phone Number: 09/28/2022, 4:19 PM  Clinical Narrative:                 Patient lives with friends, she is indep, she states her friend will transport her home at dc, she has no needs.   Expected Discharge Plan: Home/Self Care Barriers to Discharge: No Barriers Identified   Patient Goals and CMS Choice Patient states their goals for this hospitalization and ongoing recovery are:: return home   Choice offered to / list presented to : NA      Expected Discharge Plan and Services In-house Referral: NA Discharge Planning Services: CM Consult Post Acute Care Choice: NA Living arrangements for the past 2 months: Apartment Expected Discharge Date: 09/28/22               DME Arranged: N/A         HH Arranged: NA          Prior Living Arrangements/Services Living arrangements for the past 2 months: Apartment Lives with:: Friends Patient language and need for interpreter reviewed:: Yes Do you feel safe going back to the place where you live?: Yes      Need for Family Participation in Patient Care: Yes (Comment) Care giver support system in place?: Yes (comment)   Criminal Activity/Legal Involvement Pertinent to Current Situation/Hospitalization: No - Comment as needed  Activities of Daily Living Home Assistive Devices/Equipment: None ADL Screening (condition at time of admission) Patient's cognitive ability adequate to safely complete daily activities?: Yes Is the patient deaf or have difficulty hearing?: No Does the patient have difficulty seeing, even when wearing glasses/contacts?: No Does the patient have difficulty concentrating, remembering, or making decisions?: No Patient able to express need for assistance with ADLs?: Yes Does the patient have  difficulty dressing or bathing?: No Independently performs ADLs?: Yes (appropriate for developmental age) Does the patient have difficulty walking or climbing stairs?: No Weakness of Legs: None Weakness of Arms/Hands: None  Permission Sought/Granted                  Emotional Assessment Appearance:: Appears stated age Attitude/Demeanor/Rapport: Engaged Affect (typically observed): Appropriate Orientation: : Oriented to Self, Oriented to Place, Oriented to  Time, Oriented to Situation Alcohol / Substance Use: Not Applicable Psych Involvement: No (comment)  Admission diagnosis:  Pneumonia [J18.9] Community acquired pneumonia of left lower lobe of lung [J18.9] Patient Active Problem List   Diagnosis Date Noted   Pneumonia 09/26/2022   AKI (acute kidney injury) ([place]) 09/26/2022   Severe sepsis ([place]) 09/26/2022   SIRS (systemic inflammatory response syndrome) ([place]) 09/07/2022   Diarrhea 09/07/2022   Hypomagnesemia 09/07/2022   Primary central diabetes insipidus ([place]) 09/07/2022   Amenorrhea 08/06/2022   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   Panhypopituitarism ([place]) 07/22/2022   Acute encephalopathy 07/19/2022   Neck pain 06/14/2022   Stress 06/14/2022   Chronic intractable headache 06/14/2022   Ganglion cyst 06/30/2020   Environmental and seasonal allergies 05/15/2019   Acne 07/23/2018   Anxiety 07/23/2018   Depression 07/23/2018   Overweight (BMI 25.0-29.9) 06/10/2017   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: [phone]: [address]Phone: [phone]: [phone]    Social Determinants of Health ([place] Social History: [place] No Food Insecurity (09/26/2022)  Housing: Low Risk  (09/26/2022)  Transportation Needs: No Transportation Needs (09/26/2022)  Utilities: Not At Risk (09/26/2022)  Depression (PHQ2-9): Medium Risk (01/14/2020)  Financial Resource Strain: Low Risk  (07/17/2022)  Tobacco Use: Low Risk  (09/26/2022)   SDOH Interventions:     Readmission Risk Interventions    09/28/2022    4:17 PM 09/10/2022   10:23 AM  Readmission Risk Prevention Plan  Transportation Screening Complete Complete  PCP or Specialist Appt within 5-7 Days  Complete  PCP or Specialist Appt within 3-5 Days Complete   Home Care Screening  Complete  Medication Review (RN CM)  Referral to Pharmacy  HRI or Home Care Consult Complete   Palliative Care Screening Not Applicable   Medication Review (RN Care Manager) Complete

## 2022-09-28 NOTE — TOC Transition Note (Signed)
Transition of Care [facility]) - CM/SW Discharge Note   Patient Details  Name: Sara Mathis MRN: [id number] Date of Birth: [birth year]  [facility] CM/SW Contact:  Zenon Mayo, RN Phone Number: 09/28/2022, 4:19 PM   Clinical Narrative:    Patient lives with friends, she is indep, she states her friend will transport her home at dc, she has no needs.     Final next level of care: Home/Self Care Barriers to Discharge: No Barriers Identified   Patient Goals and CMS Choice   Choice offered to / list presented to : NA  Discharge Placement                         Discharge Plan and Services Additional resources added to the After Visit Summary for   In-house Referral: NA Discharge Planning Services: CM Consult Post Acute Care Choice: NA          DME Arranged: N/A         HH Arranged: NA          Social Determinants of Health (SDOH) Interventions SDOH Screenings   Food Insecurity: No Food Insecurity (09/26/2022)  Housing: Low Risk  (09/26/2022)  Transportation Needs: No Transportation Needs (09/26/2022)  Utilities: Not At Risk (09/26/2022)  Depression (PHQ2-9): Medium Risk (01/14/2020)  Financial Resource Strain: Low Risk  (07/17/2022)  Tobacco Use: Low Risk  (09/26/2022)     Readmission Risk Interventions    09/28/2022    4:17 PM 09/10/2022   10:23 AM  Readmission Risk Prevention Plan  Transportation Screening Complete Complete  PCP or Specialist Appt within 5-7 Days  Complete  PCP or Specialist Appt within 3-5 Days Complete   Home Care Screening  Complete  Medication Review (RN CM)  Referral to Pharmacy  HRI or [place] Complete   Palliative Care Screening Not Applicable   Medication Review (RN Care Manager) Complete

## 2022-09-28 NOTE — TOC Progression Note (Signed)
Discharge medications (3) are being stored in the main pharmacy on the ground floor until patient is ready for discharge.

## 2022-09-28 NOTE — Discharge Summary (Signed)
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 *  Hospital Course: 37 y.o. female with medical history significant for migraines, Graves' disease status post thyroidectomy, panhypopituitarism, and central diabetes insipidus, presenting to the emergency department with shortness of breath, headache, nausea, fever, and confusion.   Patient reports 4 days of headache, chills, nausea, and general malaise.  Her family states that she seemed to be confused on 09/24/2022.  Headache is generalized and similar to her prior headaches.  She denies neck stiffness.  Assessment and Plan: 1. Severe sepsis secondary to PNA present on admit - She appears to have developing pneumonia with multiple SIRS criteria and AKI  - Blood cultures were collected in ED, IVF bolus was given, and she was started on Rocephin and azithromycin  -cultures neg thus far -Earlier had been febrile, but now has remained afebrile. Pt reports feeling better -Will complete course of azithro and omnicef on d/c   2. Acute toxic metabolic encephalopathy  - Family raised concern that patient seemed confused on 09/24/22   - No acute findings on head CT at presentation -alert and oriented when seen, conversing appropriately   3. AKI  - SCr is 1.16 on admission, up from baseline of ~0.6  - Likely prerenal in setting of acute illness with N/V  -Cr normalized  with IVF   4. Panhypopituitarism  - Continue hydrocortisone with dose increase given her acute illness, cont home regimen on d/c - Free T4 low at 0.58 after pt was told to hold levothyroxine x 1 week prior because of elevated free T4. -Have resumed levothyroxine at 62.15mg,   5. Central diabetes insipidus  - Continue desmopressin  -Na stable and normal    6. Cystic pituitary lesion -Pt complaining of some L eye vision change, but reports it is essentially unchanged from prior admits even back to 11/23 -Neurosurgery consultation from 11/23 reviewed. At that time, no surgery was recommended unless there was an acute change -CT head this admit reviewed, lesion essentially unchanged and is stable -Again, pt reports symptoms have essentially unchanged from previous -Have advised close follow up with Neurosurgery, for which pt states she was not seen at last scheduled visit because of waiting too long in waiting room. Advised to follow up with Neurosurgery   7. Hypokalemia -Replaced       Consultants:  Procedures performed:   Disposition: Home Diet recommendation:  Regular diet DISCHARGE MEDICATION: Allergies as of 09/28/2022   No Known Allergies      Medication List     STOP taking these medications    topiramate 25 MG tablet Commonly known as: TOPAMAX       TAKE these medications    azithromycin 250 MG tablet Commonly known as: ZITHROMAX Take 1 tablet (250 mg total) by mouth daily for 3 days.   cefdinir 300 MG capsule Commonly known as: OMNICEF Take 1 capsule (300 mg total) by mouth 2 (two)  times daily for 3 days.   cholecalciferol 25 MCG (1000 UNIT) tablet Commonly known as: VITAMIN D3 Take 1,000 Units by mouth daily.   desmopressin 0.1 MG tablet Commonly known as: DDAVP Take 0.5 tablets (0.05 mg total) by mouth at bedtime. What changed: how much to take   hydrocortisone 10 MG tablet Commonly known as: CORTEF Take 1 tablet (10 mg total) by mouth 2 (two)  times daily. What changed:  how much to take when to take this additional instructions   hydrocortisone 2.5 % rectal cream Commonly known as: Proctozone-HC Place 1 Application rectally 2 (two) times daily.   levothyroxine 125 MCG tablet Commonly known as: SYNTHROID Take 0.5 tablets (62.5 mcg total) by mouth daily at 6 (six) AM. Start taking on: September 29, 2022   Magnesium 200 MG Tabs Take 1 tablet by mouth daily.   meclizine 25 MG tablet Commonly known as: ANTIVERT Take 25 mg by mouth every 12 (twelve) hours as needed for dizziness or nausea.   ondansetron 4 MG disintegrating tablet Commonly known as: ZOFRAN-ODT Take 1 tablet (4 mg total) by mouth every 8 (eight) hours as needed for nausea or vomiting.   Potassium 99 MG Tabs Take 1 tablet by mouth daily.   vitamin C 250 MG tablet Commonly known as: ASCORBIC ACID Take 250 mg by mouth daily.        Follow-up Information     [name], MD Follow up.   Specialty: Internal Medicine Why: Please follow up in a week. Contact information: [place] [address] [phone]         Kristeen Miss, MD. Schedule an appointment as soon as possible for a visit.   Specialty: Neurosurgery Why: Hospital follow up Contact information: [address] [place] [address] [place] [address] [phone]         Delrae Rend, MD. Schedule an appointment as soon as possible for a visit.   Specialty: Endocrinology Why: Hospital follow up Contact information: [address] [place] [address] [phone]                Discharge Exam: Danley Danker Weights   09/26/22 1552 09/27/22 0021 09/28/22 0514  Weight: 69.6 kg 68.6 kg 70.2 kg   General exam: Awake, laying in bed, in nad Respiratory system: Normal respiratory effort, no wheezing Cardiovascular system: regular rate, s1, s2 Gastrointestinal system: Soft, nondistended, positive BS Central nervous system: CN2-12  grossly intact, strength intact Extremities: Perfused, no clubbing Skin: Normal skin turgor, no notable skin lesions seen Psychiatry: Mood normal // no visual hallucinations   Condition at discharge: fair  The results of significant diagnostics from this hospitalization (including imaging, microbiology, ancillary and laboratory) are listed below for reference.   Imaging Studies: CT HEAD WO CONTRAST (5MM)  Result Date: 09/25/2022 CLINICAL DATA:  Initial evaluation for delirium. EXAM: CT HEAD WITHOUT CONTRAST TECHNIQUE: Contiguous axial images were obtained from the base of the skull through the vertex without intravenous contrast. RADIATION DOSE REDUCTION: This exam was performed according to the departmental dose-optimization program which includes automated exposure control, adjustment of the mA and/or kV according to patient size and/or use of iterative reconstruction technique. COMPARISON:  Prior CT from 09/06/2022. FINDINGS: Brain: Examination mildly limited by motion and patient positioning. Cerebral volume within normal limits. No acute intracranial hemorrhage. No acute large vessel territory infarct. Cystic sellar/suprasellar mass again noted, stable. No other mass lesion, mass effect, or midline shift. No hydrocephalus or extra-axial fluid collection.  Vascular: No abnormal hyperdense vessel. Skull: Scalp soft tissues and calvarium demonstrate no acute finding. Sinuses/Orbits: Globes orbital soft tissues within normal limits. Mild scattered mucoperiosteal thickening present about the ethmoidal air cells and maxillary sinuses. No mastoid effusion. Other: None. IMPRESSION: 1. No acute intracranial abnormality. 2. 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 Electronically Signed   By: Jeannine Boga M.D.   On: 09/25/2022 23:24   DG Chest Port 1 View  Result Date: 09/25/2022 CLINICAL DATA:  Fever EXAM: PORTABLE CHEST 1 VIEW COMPARISON:  09/07/2022 FINDINGS: Hazy opacity  over the left lower lung with increased interstitial prominence. No pleural effusion or pneumothorax. Normal cardiomediastinal silhouette. No acute osseous abnormality. IMPRESSION: Hazy opacity over the left lower lung suspicious for pneumonia. Electronically Signed   By: Placido Sou M.D.   On: 09/25/2022 23:17   DG Chest Port 1 View  Result Date: 09/07/2022 CLINICAL DATA:  Headache, vomiting, blurred vision EXAM: PORTABLE CHEST 1 VIEW COMPARISON:  07/19/2022 FINDINGS: Lungs are clear.  No pleural effusion or pneumothorax. The heart is normal in size. IMPRESSION: No evidence of acute cardiopulmonary disease. Electronically Signed   By: Julian Hy M.D.   On: 09/07/2022 03:16   CT Head Wo Contrast  Result Date: 09/06/2022 CLINICAL DATA:  Headache, increasing frequency or severity Known pituitary tumor, worsening headache, new onset emesis EXAM: CT HEAD WITHOUT CONTRAST TECHNIQUE: Contiguous axial images were obtained from the base of the skull through the vertex without intravenous contrast. RADIATION DOSE REDUCTION: This exam was performed according to the departmental dose-optimization program which includes automated exposure control, adjustment of the mA and/or kV according to patient size and/or use of iterative reconstruction technique. COMPARISON:  08/24/2022. FINDINGS: Brain: No evidence of acute infarction, hemorrhage, hydrocephalus, extra-axial collection or mass effect. Cystic appearing lesion with expansion of the sella is a stable finding measuring about 1.8 cm. Vascular: No hyperdense vessel or unexpected calcification. Skull: Normal. Negative for fracture or focal lesion. Sinuses/Orbits: No acute finding. 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 No hydrocephalus. No mass effect or shift. Electronically Signed   By: Sammie Bench M.D.   On: 09/06/2022 18:00    Microbiology: Results for orders placed or performed  during the hospital encounter of 09/25/22  Blood culture (routine x 2)     Status: None (Preliminary result)   Collection Time: 09/25/22  8:19 PM   Specimen: BLOOD  Result Value Ref Range Status   Specimen Description BLOOD LEFT ANTECUBITAL  Final   Special Requests   Final    BOTTLES DRAWN AEROBIC AND ANAEROBIC Blood Culture adequate volume   Culture   Final    NO GROWTH 3 DAYS Performed at [facility], [place] [address] [place], [place] [address]    Report Status PENDING  Incomplete  Resp panel by RT-PCR (RSV, Flu A&B, Covid) Anterior Nasal Swab     Status: None   Collection Time: 09/25/22 10:22 PM   Specimen: Anterior Nasal Swab  Result Value Ref Range Status   SARS Coronavirus 2 by RT PCR NEGATIVE NEGATIVE Final    Comment: (NOTE) SARS-CoV-2 target nucleic acids are NOT DETECTED.  The SARS-CoV-2 RNA is generally detectable in upper respiratory specimens during the acute phase of infection. The lowest concentration of SARS-CoV-2 viral copies this assay can detect is 138 copies/mL. A negative result does not preclude SARS-Cov-2 infection and should not be used as the sole basis for treatment or other  patient management decisions. A negative result may occur with  improper specimen collection/handling, submission of specimen other than nasopharyngeal swab, presence of viral mutation(s) within the areas targeted by this assay, and inadequate number of viral copies(<138 copies/mL). A negative result must be combined with clinical observations, patient history, and epidemiological information. The expected result is Negative.  Fact Sheet for Patients:  EntrepreneurPulse.com.au  Fact Sheet for Healthcare Providers:  IncredibleEmployment.be  This test is no t yet approved or cleared by the [place] FDA and  has been authorized for detection and/or diagnosis of SARS-CoV-2 by FDA under an Emergency Use Authorization (EUA). This EUA will  remain  in effect (meaning this test can be used) for the duration of the COVID-19 declaration under Section 564(b)(1) of the Act, 21 U.S.C.section 360bbb-3(b)(1), unless the authorization is terminated  or revoked sooner.       Influenza A by PCR NEGATIVE NEGATIVE Final   Influenza B by PCR NEGATIVE NEGATIVE Final    Comment: (NOTE) The Xpert Xpress SARS-CoV-2/FLU/RSV plus assay is intended as an aid in the diagnosis of influenza from Nasopharyngeal swab specimens and should not be used as a sole basis for treatment. Nasal washings and aspirates are unacceptable for Xpert Xpress SARS-CoV-2/FLU/RSV testing.  Fact Sheet for Patients: EntrepreneurPulse.com.au  Fact Sheet for Healthcare Providers: IncredibleEmployment.be  This test is not yet approved or cleared by the [place] FDA and has been authorized for detection and/or diagnosis of SARS-CoV-2 by FDA under an Emergency Use Authorization (EUA). This EUA will remain in effect (meaning this test can be used) for the duration of the COVID-19 declaration under Section 564(b)(1) of the Act, 21 U.S.C. section 360bbb-3(b)(1), unless the authorization is terminated or revoked.     Resp Syncytial Virus by PCR NEGATIVE NEGATIVE Final    Comment: (NOTE) Fact Sheet for Patients: EntrepreneurPulse.com.au  Fact Sheet for Healthcare Providers: IncredibleEmployment.be  This test is not yet approved or cleared by the [place] FDA and has been authorized for detection and/or diagnosis of SARS-CoV-2 by FDA under an Emergency Use Authorization (EUA). This EUA will remain in effect (meaning this test can be used) for the duration of the COVID-19 declaration under Section 564(b)(1) of the Act, 21 U.S.C. section 360bbb-3(b)(1), unless the authorization is terminated or revoked.  Performed at [facility], [place] [address] [place], [place] [address]    Respiratory (~20 pathogens) panel by PCR     Status: None   Collection Time: 09/25/22 10:22 PM   Specimen: Anterior Nasal Swab; Respiratory  Result Value Ref Range Status   Adenovirus NOT DETECTED NOT DETECTED Final   Coronavirus 229E NOT DETECTED NOT DETECTED Final    Comment: (NOTE) The Coronavirus on the Respiratory Panel, DOES NOT test for the novel  Coronavirus (2019 nCoV)    Coronavirus HKU1 NOT DETECTED NOT DETECTED Final   Coronavirus NL63 NOT DETECTED NOT DETECTED Final   Coronavirus OC43 NOT DETECTED NOT DETECTED Final   Metapneumovirus NOT DETECTED NOT DETECTED Final   Rhinovirus / Enterovirus NOT DETECTED NOT DETECTED Final   Influenza A NOT DETECTED NOT DETECTED Final   Influenza B NOT DETECTED NOT DETECTED Final   Parainfluenza Virus 1 NOT DETECTED NOT DETECTED Final   Parainfluenza Virus 2 NOT DETECTED NOT DETECTED Final   Parainfluenza Virus 3 NOT DETECTED NOT DETECTED Final   Parainfluenza Virus 4 NOT DETECTED NOT DETECTED Final   Respiratory Syncytial Virus NOT DETECTED NOT DETECTED Final   Bordetella pertussis NOT DETECTED NOT  DETECTED Final   Bordetella Parapertussis NOT DETECTED NOT DETECTED Final   Chlamydophila pneumoniae NOT DETECTED NOT DETECTED Final   Mycoplasma pneumoniae NOT DETECTED NOT DETECTED Final    Comment: Performed at [facility], [place] [address] [place], [place] [address]  Blood culture (routine x 2)     Status: None (Preliminary result)   Collection Time: 09/25/22 10:43 PM   Specimen: BLOOD LEFT HAND  Result Value Ref Range Status   Specimen Description BLOOD LEFT HAND  Final   Special Requests   Final    BOTTLES DRAWN AEROBIC AND ANAEROBIC Blood Culture results may not be optimal due to an inadequate volume of blood received in culture bottles   Culture   Final    NO GROWTH 3 DAYS Performed at [facility], [place] [address] [place], [place] [address]    Report Status PENDING  Incomplete  Urine Culture     Status: None    Collection Time: 09/26/22 12:00 AM   Specimen: Urine, Clean Catch  Result Value Ref Range Status   Specimen Description URINE, CLEAN CATCH  Final   Special Requests NONE  Final   Culture   Final    NO GROWTH Performed at [facility], [place] [address] [place], [place] [address]    Report Status 09/26/2022 FINAL  Final    Labs: CBC: Recent Labs  Lab 09/25/22 2222 09/26/22 0325 09/27/22 0033 09/28/22 0057  WBC 7.2 7.7 7.7 3.7*  NEUTROABS 2.5  --   --   --   HGB 14.9 12.8 12.1 10.3*  HCT 47.2* 40.2 37.1 32.4*  MCV 91.3 92.2 90.0 91.5  PLT 243 183 152 768*   Basic Metabolic Panel: Recent Labs  Lab 09/26/22 0011 09/26/22 0325 09/27/22 0033 09/28/22 0057  NA 135 140 135 139  K 3.6 3.8 3.2* 3.7  CL 104 112* 105 107  CO2 20* 19* 20* 23  GLUCOSE 83 79 106* 106*  BUN 10 9 5* 7  CREATININE 1.16* 1.21* 0.93 0.94  CALCIUM 8.3* 8.3* 8.8* 8.5*  MG  --  1.7  --   --   PHOS  --  3.5  --   --    Liver Function Tests: Recent Labs  Lab 09/26/22 0011  AST 18  ALT 15  ALKPHOS 38  BILITOT 1.1  PROT 6.1*  ALBUMIN 3.2*   CBG: No results for input(s): "GLUCAP" in the last 168 hours.  Discharge time spent: less than 30 minutes.  Signed: Marylu Lund, MD [facility] 09/28/2022

## 2022-09-30 LAB — CULTURE, BLOOD (ROUTINE X 2)
Culture: NO GROWTH
Culture: NO GROWTH
Special Requests: ADEQUATE

## 2022-10-02 LAB — LEGIONELLA PNEUMOPHILA SEROGP 1 UR AG: L. pneumophila Serogp 1 Ur Ag: NEGATIVE

## 2022-10-03 NOTE — Telephone Encounter (Signed)
Notify signed letter available for pick up

## 2022-11-15 ENCOUNTER — Other Ambulatory Visit: Payer: Self-pay

## 2022-11-15 ENCOUNTER — Emergency Department (HOSPITAL_COMMUNITY)
Admission: EM | Admit: 2022-11-15 | Discharge: 2022-11-15 | Disposition: A | Discharge status: Home / Self Care | Payer: BLUE CROSS/BLUE SHIELD | Attending: Emergency Medicine | Admitting: Emergency Medicine

## 2022-11-15 DIAGNOSIS — G43809 Other migraine, not intractable, without status migrainosus: Secondary | ICD-10-CM | POA: Insufficient documentation

## 2022-11-15 DIAGNOSIS — R519 Headache, unspecified: Secondary | ICD-10-CM | POA: Diagnosis present

## 2022-11-15 LAB — I-STAT BETA HCG BLOOD, ED (MC, WL, AP ONLY): I-stat hCG, quantitative: <5 m[IU]/mL (ref <5)

## 2022-11-15 MED ORDER — ACETAMINOPHEN 500 MG PO TABS
1000.0000 mg | ORAL_TABLET | Freq: Once | ORAL | Status: AC
  Administered 2022-11-15: 1000 mg via ORAL
  Filled 2022-11-15: qty 2

## 2022-11-15 MED ORDER — SODIUM CHLORIDE 0.9 % IV BOLUS
1000.0000 mL | Freq: Once | INTRAVENOUS | Status: AC
  Administered 2022-11-15: 1000 mL via INTRAVENOUS

## 2022-11-15 MED ORDER — KETOROLAC TROMETHAMINE 15 MG/ML IJ SOLN
15.0000 mg | Freq: Once | INTRAMUSCULAR | Status: AC
  Administered 2022-11-15: 15 mg via INTRAVENOUS
  Filled 2022-11-15: qty 1

## 2022-11-15 MED ORDER — METOCLOPRAMIDE HCL 5 MG/ML IJ SOLN
10.0000 mg | Freq: Once | INTRAMUSCULAR | Status: AC
  Administered 2022-11-15: 10 mg via INTRAVENOUS
  Filled 2022-11-15: qty 2

## 2022-11-15 MED ORDER — ONDANSETRON HCL 4 MG PO TABS: 4.0000 mg | ORAL_TABLET | Freq: Four times a day (QID) | ORAL | 0 refills | Status: AC

## 2022-11-15 MED ORDER — ONDANSETRON 4 MG PO TBDP
4.0000 mg | ORAL_TABLET | Freq: Once | ORAL | Status: AC
  Administered 2022-11-15: 4 mg via ORAL
  Filled 2022-11-15: qty 1

## 2022-11-15 NOTE — Discharge Instructions (Signed)
You were seen in the emergency department for your headache.  You had no signs of stroke and did not appear severely dehydrated.  Your heart rhythm was normal and your EKG.  This appeared consistent with your normal headaches and you can follow-up with your primary doctor and your neurosurgeon for your recurrent headaches for further management.  You can take Tylenol and Motrin as needed for pain and Zofran as needed for nausea.  You should return to the emergency department if you are having significantly worsening headaches, numbness or weakness on one side of the body compared to the other or if you have any other new or concerning symptoms.

## 2022-11-15 NOTE — ED Provider Triage Note (Signed)
Emergency Medicine Provider Triage Evaluation Note  Joen Laura , a 37 y.o. female  was evaluated in triage.  Pt complains of headache. She states that same began gradually 2 days ago. 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 She states that she has been here several times with these same symptoms and is given a headache cocktail with improvement of symptoms. She requests same. She does endorse some vision changes, however she states that this is typical when she gets these headaches. She also endorses some intermittent nausea, vomiting, and photophobia. Denies fevers  Review of Systems  Positive:  Negative:   Physical Exam  BP 116/87 (BP Location: Right Arm)   Pulse 91   Temp 98.6 F (37 C) (Oral)   Resp 18   Ht 5' 6.5" (1.689 m)   Wt 72.6 kg   LMP  (LMP Unknown)   SpO2 100%   BMI 25.44 kg/m  Gen:   Awake, no distress   Resp:  Normal effort  MSK:   Moves extremities without difficulty  Other:    Medical Decision Making  Medically screening exam initiated at 9:42 AM.  Appropriate orders placed.  [name] was informed that the remainder of the evaluation will be completed by another provider, this initial triage assessment does not replace that evaluation, and the importance of remaining in the ED until their evaluation is complete.     Bud Face, PA-C 11/15/22 [phone]

## 2022-11-15 NOTE — ED Triage Notes (Signed)
Pt. Stated, I started 2 days ago with a headache, N/V  and some eye pain. 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 Michela Pitcher no surgery.

## 2022-11-15 NOTE — ED Provider Notes (Signed)
[facility] Provider Note   CSN: [id number] Arrival date & time: 11/15/22  [id number]     History  Chief Complaint  Patient presents with   Headache   Nausea   Emesis   Eye Problem    Sara Mathis is a 37 y.o. female.  Patient is a 37 year old female with a past medical history of pituitary adenoma, migraine headaches presenting to the emergency department with a headache.  The patient states that she gets frequent headaches from her pituitary adenoma and this headache started over the last 2 days.  She states that it feels similar to prior headaches but is more severe.  She states that she feels like her vision is slightly blurry on her right eye.  She otherwise denies any numbness or weakness.  She states that she has been nauseous but denies any vomiting.  She reports photophobia and phonophobia.  The history is provided by the patient.  Headache Associated symptoms: vomiting   Emesis Associated symptoms: headaches   Eye Problem Associated symptoms: headaches and vomiting        Home Medications Prior to Admission medications   Medication Sig Start Date End Date Taking? Authorizing Provider  ondansetron (ZOFRAN) 4 MG tablet Take 1 tablet (4 mg total) by mouth every 6 (six) hours. 11/15/22  Yes Maylon Peppers, [place], DO  cholecalciferol (VITAMIN D3) 25 MCG (1000 UNIT) tablet Take 1,000 Units by mouth daily.    [provider]  desmopressin (DDAVP) 0.1 MG tablet Take 0.5 tablets (0.05 mg total) by mouth at bedtime. Patient taking differently: Take 0.1 mg by mouth at bedtime. 07/24/22   Thurnell Lose, MD  hydrocortisone (CORTEF) 10 MG tablet Take 1 tablet (10 mg total) by mouth 2 (two) times daily. Patient taking differently: Take 5-15 mg by mouth See admin instructions. Take 1.5 tablet by mouth in the morning, then 0.5 tablet by mouth in the evening 07/24/22   Thurnell Lose, MD  hydrocortisone  (PROCTOZONE-HC) 2.5 % rectal cream Place 1 Application rectally 2 (two) times daily. 08/16/22   [name], MD  levothyroxine (SYNTHROID) 125 MCG tablet Take 0.5 tablets (62.5 mcg total) by mouth daily at 6 (six) AM. 09/29/22 10/29/22  Donne Hazel, MD  Magnesium 200 MG TABS Take 1 tablet by mouth daily.    [provider]  meclizine (ANTIVERT) 25 MG tablet Take 25 mg by mouth every 12 (twelve) hours as needed for dizziness or nausea. 09/11/22   [provider]  ondansetron (ZOFRAN-ODT) 4 MG disintegrating tablet Take 1 tablet (4 mg total) by mouth every 8 (eight) hours as needed for nausea or vomiting. Patient not taking: Reported on 09/26/2022 09/10/22   Darliss Cheney, MD  Potassium 99 MG TABS Take 1 tablet by mouth daily.    [provider]  vitamin C (ASCORBIC ACID) 250 MG tablet Take 250 mg by mouth daily.    [provider]      Allergies    Patient has no known allergies.    Review of Systems   Review of Systems  Gastrointestinal:  Positive for vomiting.  Neurological:  Positive for headaches.    Physical Exam Updated Vital Signs BP 116/87 (BP Location: Right Arm)   Pulse 91   Temp 98.6 F (37 C) (Oral)   Resp 18   Ht 5' 6.5" (1.689 m)   Wt 72.6 kg   LMP  (LMP Unknown)   SpO2 100%  BMI 25.44 kg/m  Physical Exam Vitals and nursing note reviewed.  Constitutional:      General: She is not in acute distress.    Appearance: She is well-developed.  HENT:     Head: Normocephalic and atraumatic.     Mouth/Throat:     Mouth: Mucous membranes are moist.     Pharynx: Oropharynx is clear.  Eyes:     Extraocular Movements: Extraocular movements intact.     Pupils: Pupils are equal, round, and reactive to light.  Cardiovascular:     Rate and Rhythm: Normal rate and regular rhythm.  Pulmonary:     Effort: Pulmonary effort is normal.  Abdominal:     Palpations: Abdomen is soft.  Musculoskeletal:        General: Normal range of  motion.     Cervical back: Normal range of motion and neck supple.  Skin:    General: Skin is warm and dry.  Neurological:     Mental Status: She is alert and oriented to person, place, and time.     GCS: GCS eye subscore is 4. GCS verbal subscore is 5. GCS motor subscore is 6.     Cranial Nerves: No cranial nerve deficit, dysarthria or facial asymmetry.     Sensory: No sensory deficit.     Motor: No weakness.  Psychiatric:        Mood and Affect: Mood normal.        Behavior: Behavior normal.     ED Results / Procedures / Treatments   Labs (all labs ordered are listed, but only abnormal results are displayed) Labs Reviewed  I-STAT BETA HCG BLOOD, ED ([facility], [facility], [facility] ONLY)  I-STAT BETA HCG BLOOD, ED ([facility], [facility], [facility] ONLY)    EKG EKG Interpretation  Date/Time:  Thursday November 15 2022 12:18:24 EST Ventricular Rate:  88 PR Interval:  162 QRS Duration: 82 QT Interval:  372 QTC Calculation: 450 R Axis:   52 Text Interpretation: Normal sinus rhythm Normal ECG  Rate decreased, otherwise no significant change from prior EKG Confirmed by Leanord Asal ([id number]) on 11/15/2022 1:24:51 PM  Radiology No results found.  Procedures Procedures    Medications Ordered in ED Medications  ondansetron (ZOFRAN-ODT) disintegrating tablet 4 mg (4 mg Oral Given 11/15/22 1031)  ketorolac (TORADOL) 15 MG/ML injection 15 mg (15 mg Intravenous Given 11/15/22 1103)  metoCLOPramide (REGLAN) injection 10 mg (10 mg Intravenous Given 11/15/22 1103)  acetaminophen (TYLENOL) tablet 1,000 mg (1,000 mg Oral Given 11/15/22 1104)  sodium chloride 0.9 % bolus 1,000 mL (1,000 mLs Intravenous New Bag/Given 11/15/22 1104)    ED Course/ Medical Decision Making/ A&P                             Medical Decision Making This patient presents to the ED with chief complaint(s) of headache with pertinent past medical history of pituitary adenoma, migraine headaches which further complicates the presenting complaint. The  complaint involves an extensive differential diagnosis and also carries with it a high risk of complications and morbidity.    The differential diagnosis includes migraine headache, no focal neurologic deficits and similar to prior headaches making ICH or mass effect unlikely, normotensive making pre-eclampsia unlikely, arrthymia   Additional history obtained: Additional history obtained from N/A Records reviewed Care Everywhere/External Records and Primary Care Documents  ED Course and Reassessment: On patient's arrival to the emergency department she has her hat covering her eyes due  to her photophobia but is otherwise well-appearing in no acute distress without focal neurologic deficits.  Headache seems consistent with patient's prior known history of headaches.  She will be treated with a headache cocktail and will be closely reassessed.  She also reported some associated dizziness with her headache and EKG will be performed to evaluate for arrhythmia.  Independent labs interpretation:  The following labs were independently interpreted: pregnancy test negative  Independent visualization of imaging: N/A  Consultation: - Consulted or discussed management/test interpretation w/ external professional: N/A  Consideration for admission or further workup: Patient has no emergent conditions requiring admission or further work-up at this time and is stable for discharge home with primary care follow-up  Social Determinants of health: N/A    Risk OTC drugs. Prescription drug management.          Final Clinical Impression(s) / ED Diagnoses Final diagnoses:  Other migraine without status migrainosus, not intractable    Rx / DC Orders ED Discharge Orders          Ordered    ondansetron (ZOFRAN) 4 MG tablet  Every 6 hours        11/15/22 1332              [place], [place], DO 11/15/22 1337

## 2023-12-23 IMAGING — US US OB < 14 WEEKS - US OB TV
1 series · 15 of 28 positions shown · non-contrast
Comparison: None.

CLINICAL DATA: Pregnancy and bleeding. LMP: 08/14/2021
corresponding to an estimated gestational age of 11 weeks, 2 days.

EXAM:
OBSTETRIC <14 WK US AND TRANSVAGINAL OB US
TECHNIQUE: Both transabdominal and transvaginal ultrasound examinations were
performed for complete evaluation of the gestation as well as the
maternal uterus, adnexal regions, and pelvic cul-de-sac.
Transvaginal technique was performed to assess early pregnancy.

[Series 1: us ob < 14 weeks - us ob tv · 15 of 67 slices shown]
[im 1/67]
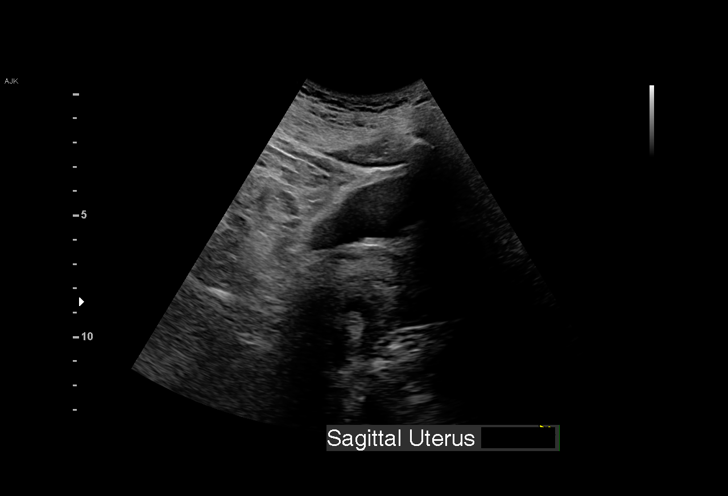
[im 5/67]
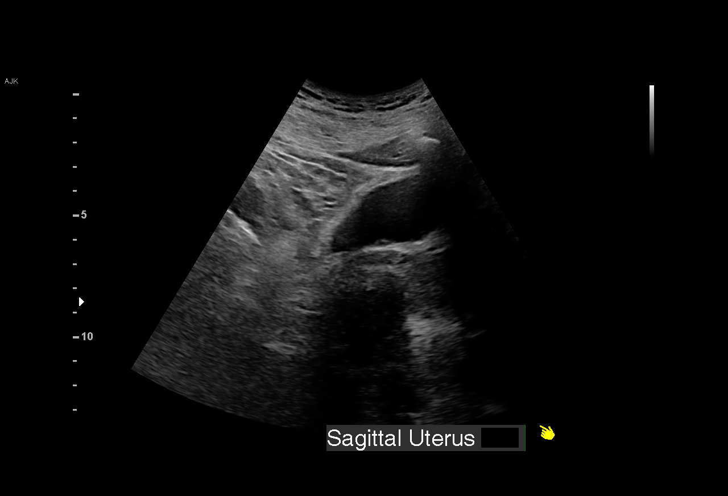
[im 10/67]
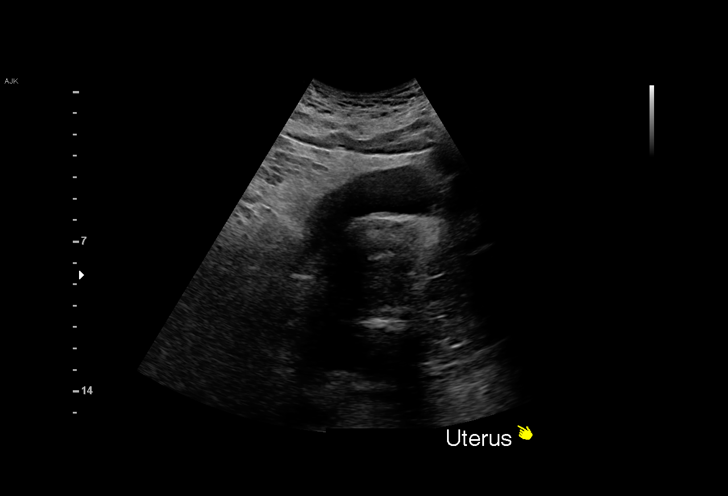
[im 15/67]
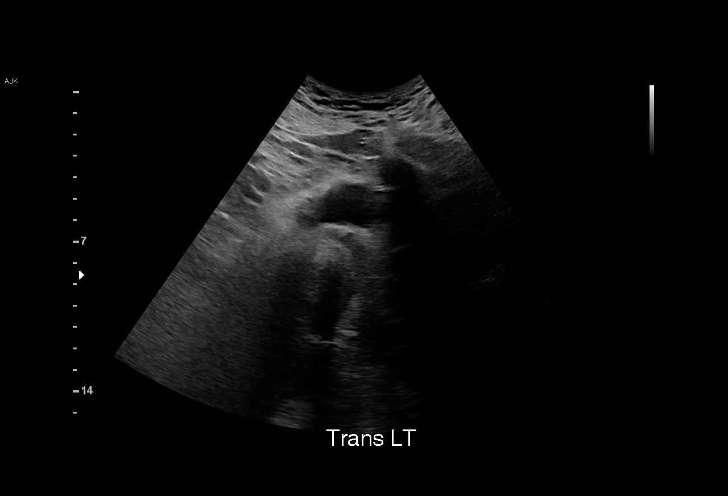
[im 20/67]
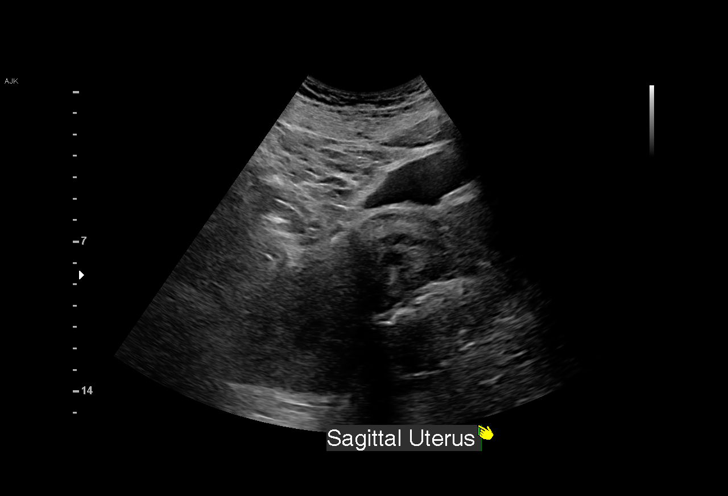
[im 25/67]
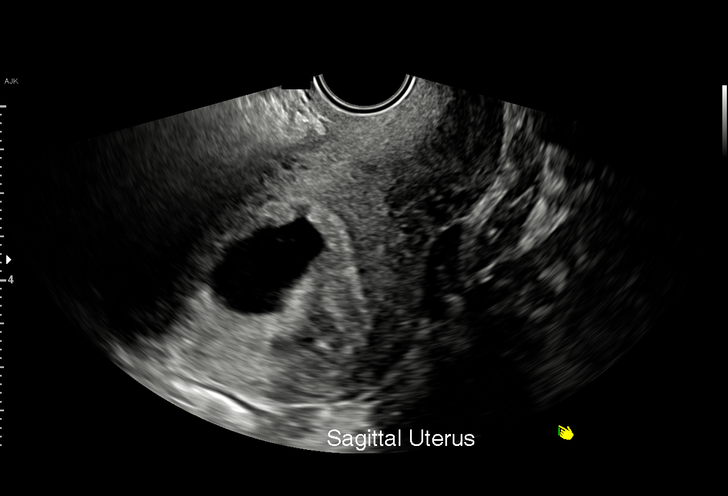
[im 30/67]
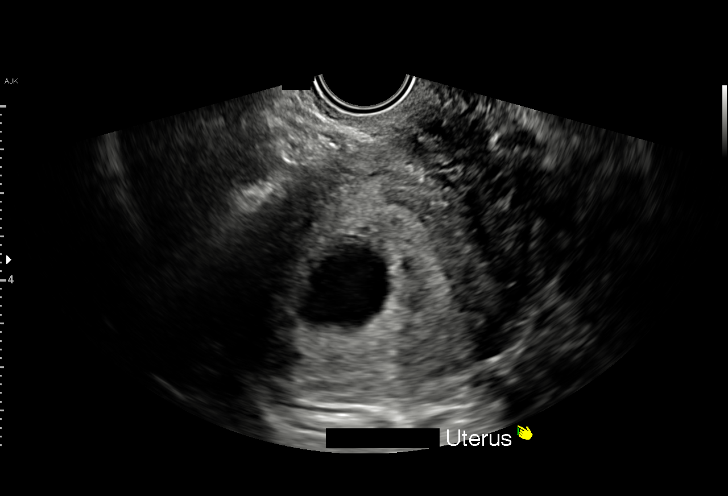
[im 35/67]
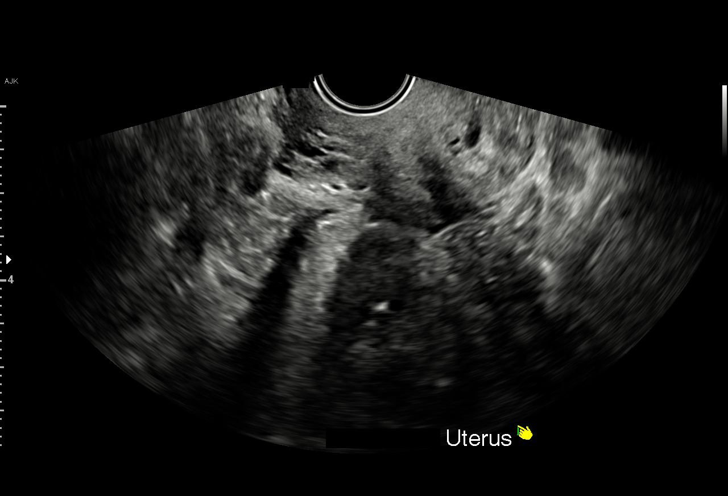
[im 37/67]
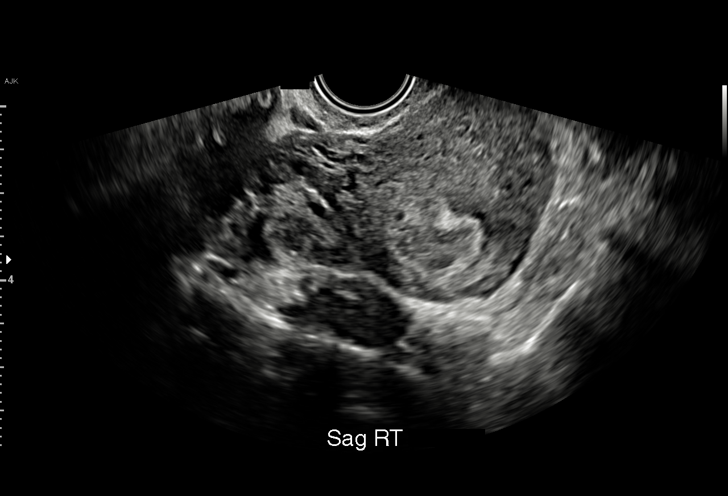
[im 42/67]
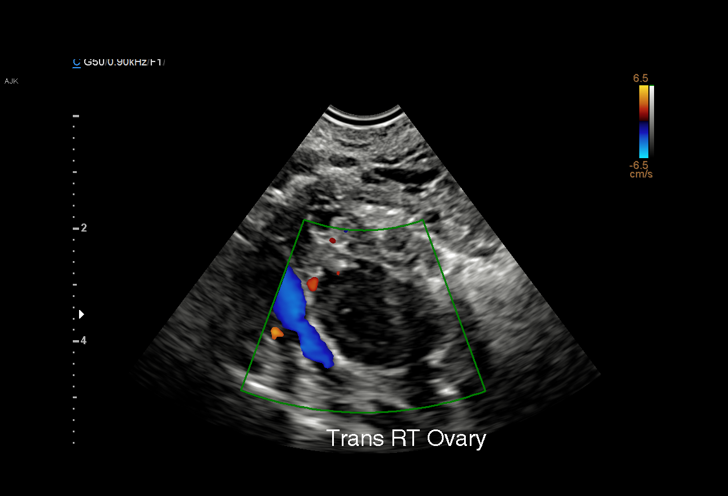
[im 47/67]
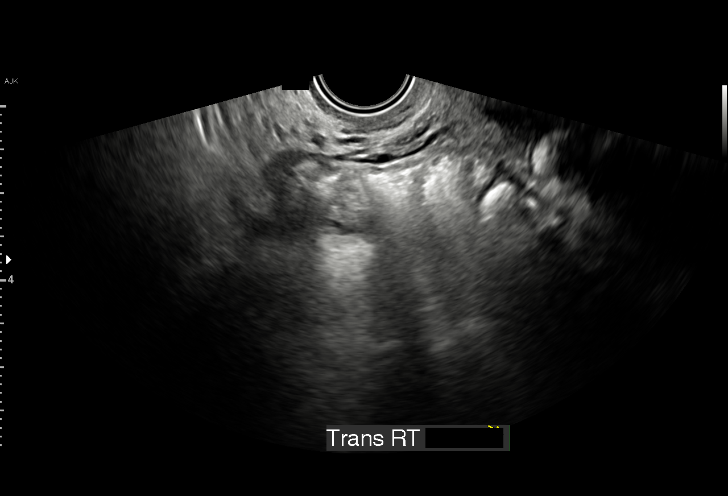
[im 52/67]
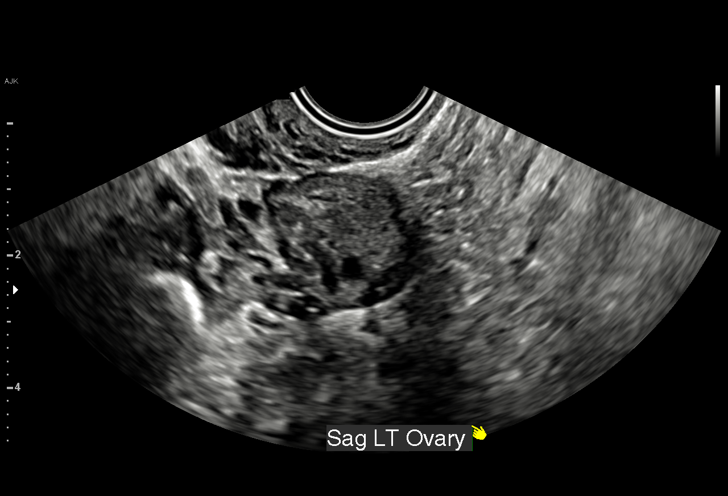
[im 57/67]
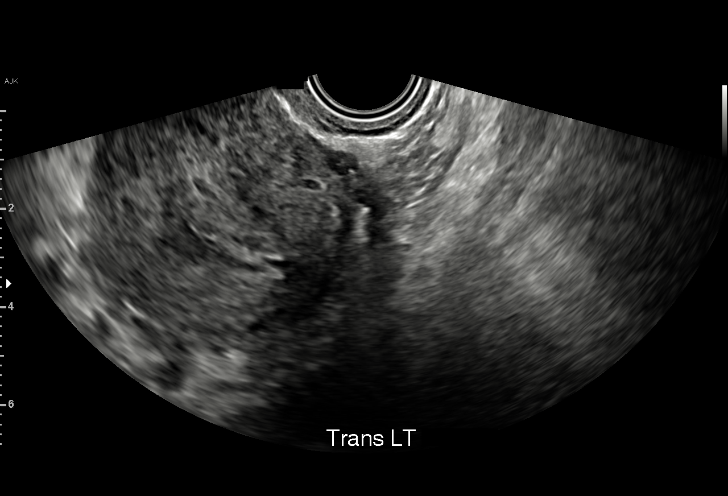
[im 62/67]
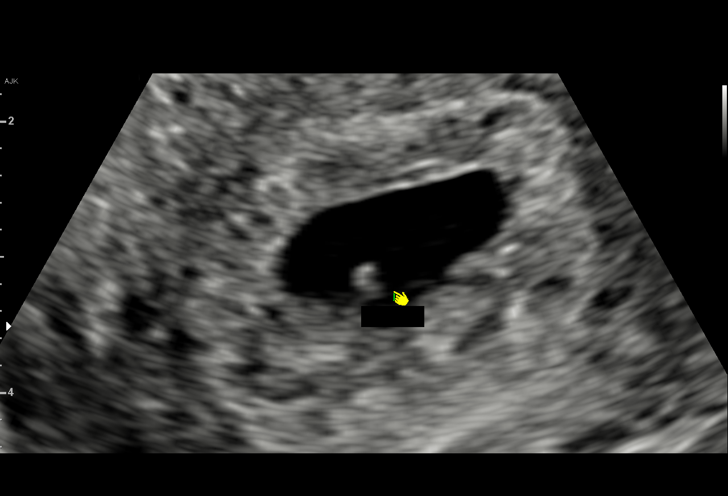
[im 67/67]
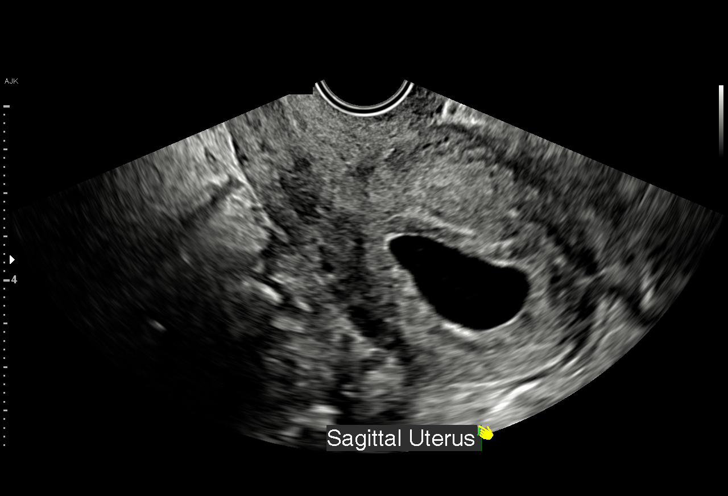

[15 of 28 positions shown; findings below may reference images not displayed]

FINDINGS: Intrauterine gestational sac: Single intrauterine gestational sac.

Yolk sac:  Seen

Embryo:  Present

Cardiac Activity: Not detected

Heart Rate: Not detected

CRL:  3 mm   5 w   6 d                  US EDC: 06/28/2022

Subchorionic hemorrhage:  None visualized.

Maternal uterus/adnexae: The ovary is unremarkable. There is a
corpus luteum in the right ovary.
IMPRESSION: Single intrauterine pregnancy with an estimated gestational age of 5
weeks, 6 days based on crown-rump length. No embryonic cardiac
activity identified. Follow-up with ultrasound in 7-11 days, or
earlier if clinically indicated, recommended.

## 2023-12-25 IMAGING — US US OB TRANSVAGINAL
1 series · 15 of 27 positions shown · non-contrast
Comparison: 11/01/2021

CLINICAL DATA: Pregnant, pain, vaginal bleeding

EXAM:
TRANSVAGINAL OB ULTRASOUND
TECHNIQUE: Transvaginal ultrasound was performed for complete evaluation of the
gestation as well as the maternal uterus, adnexal regions, and
pelvic cul-de-sac.

[Series 1: us ob transvaginal · 15 of 27 slices shown]
[im 1/27]
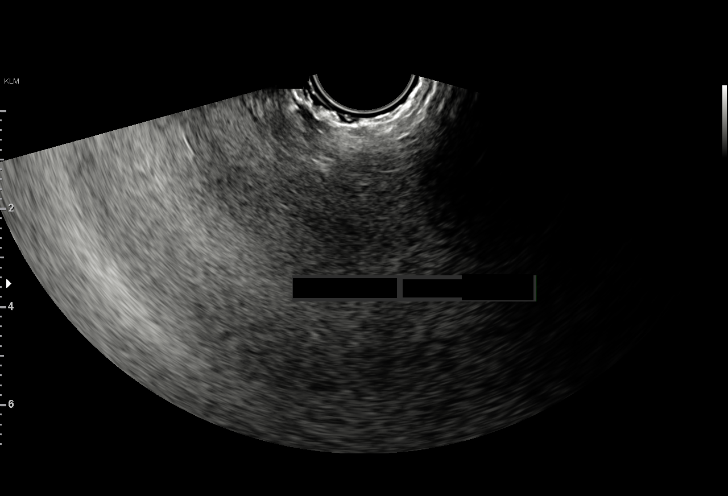
[im 3/27]
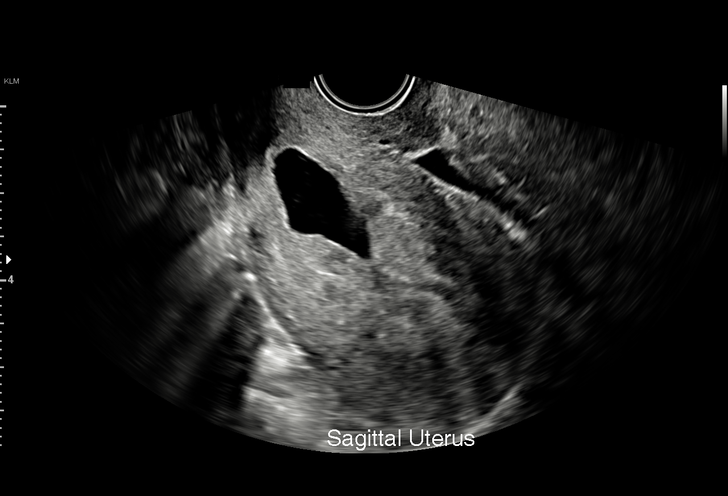
[im 5/27]
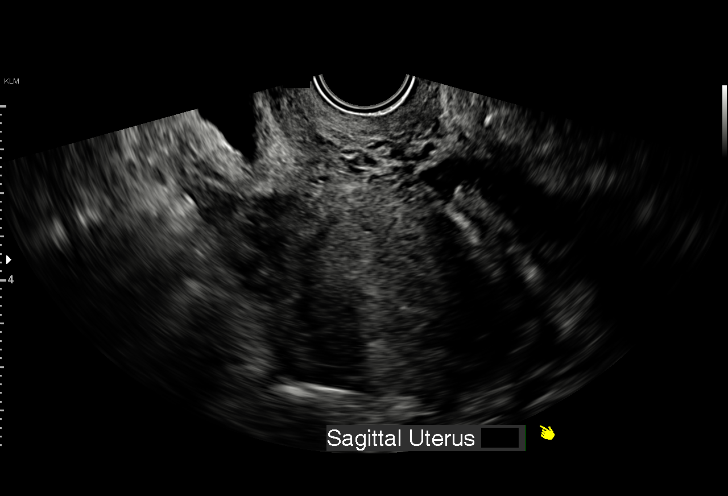
[im 7/27]
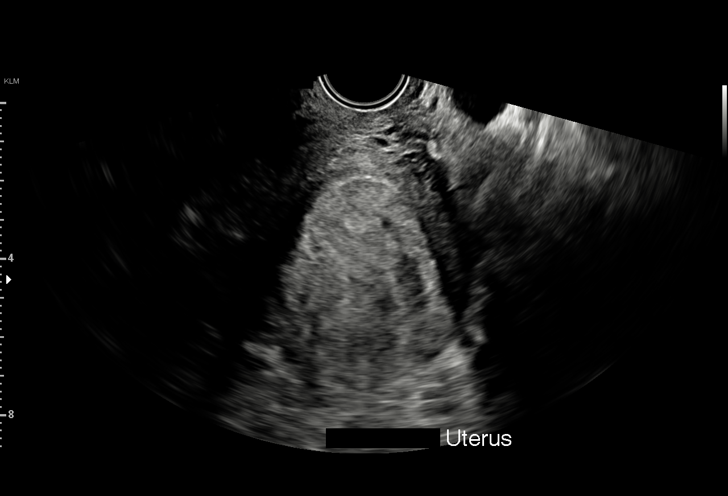
[im 9/27]
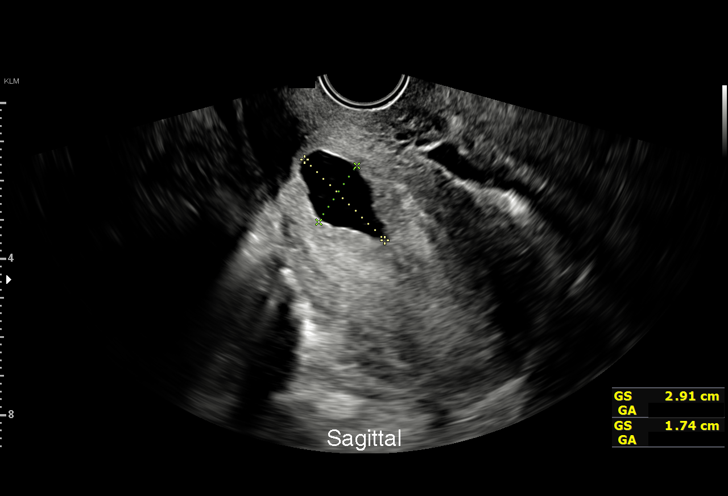
[im 10/27]
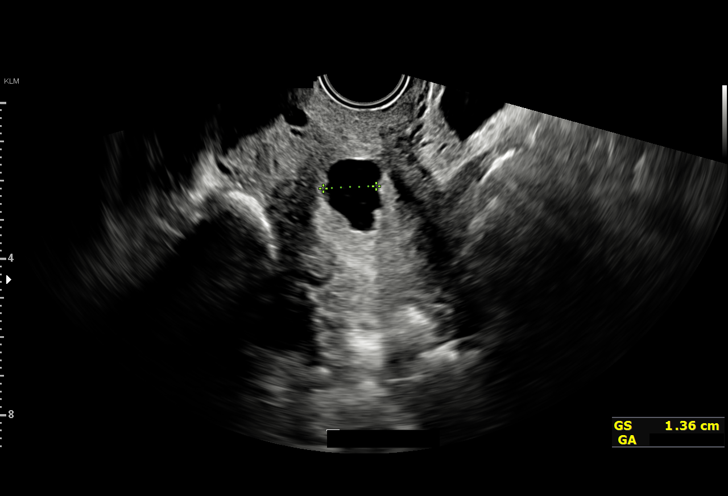
[im 12/27]
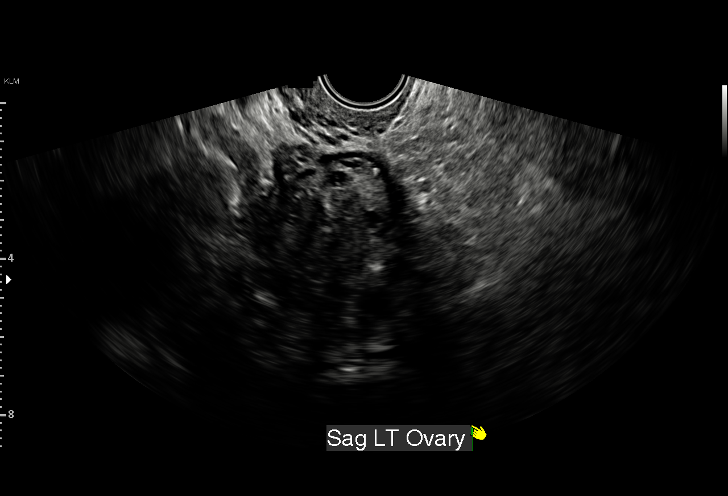
[im 14/27]
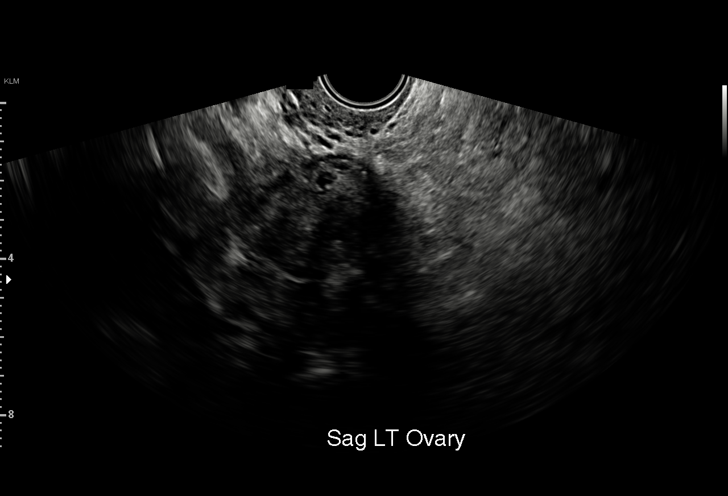
[im 16/27]
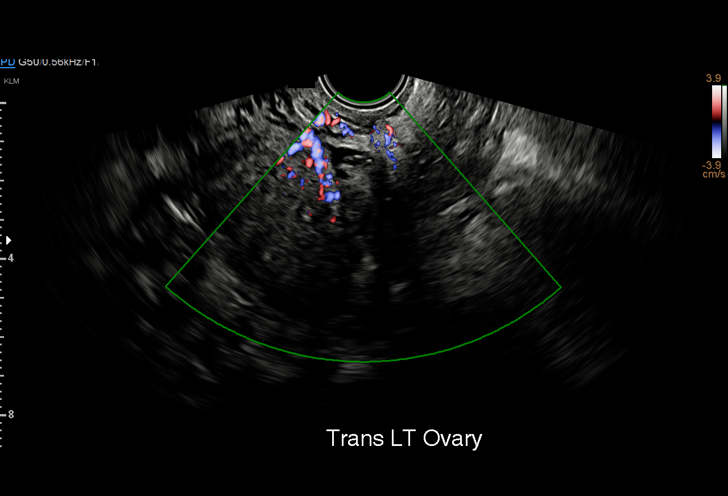
[im 18/27]
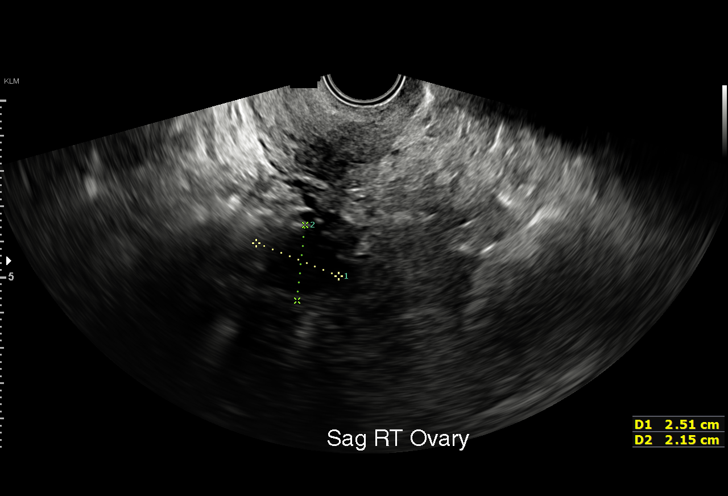
[im 19/27]
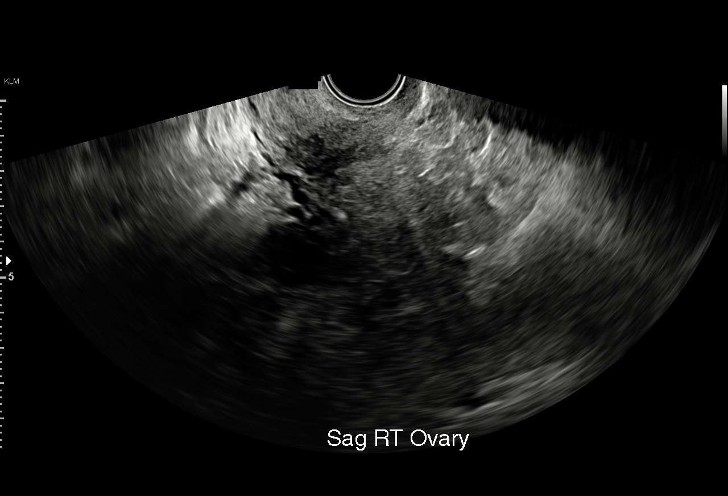
[im 21/27]
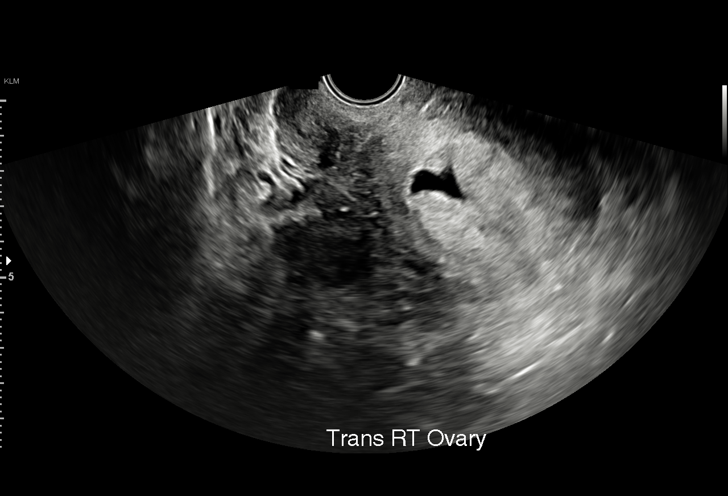
[im 23/27]
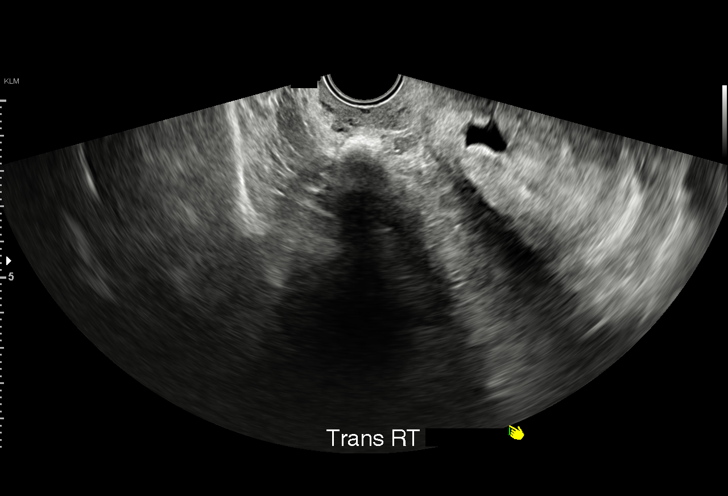
[im 25/27]
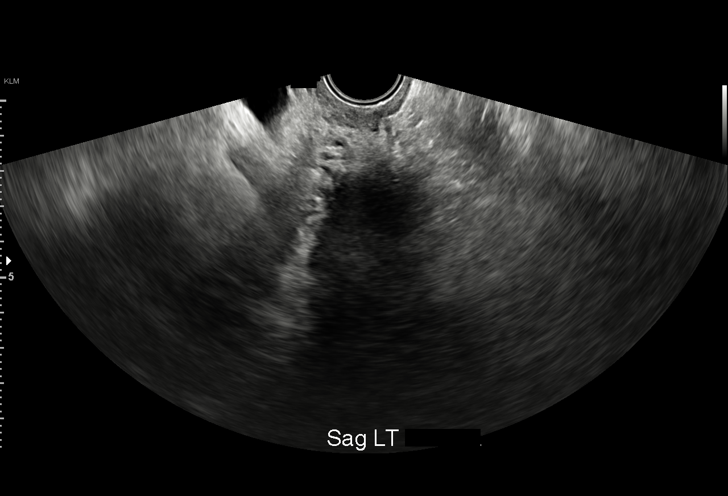
[im 27/27]
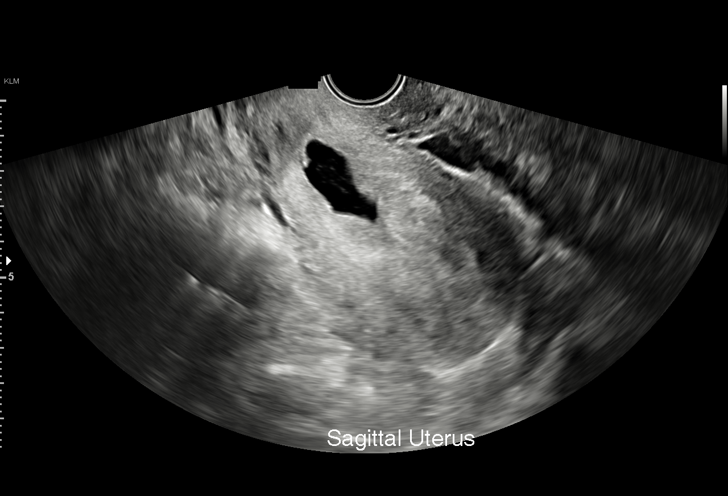

[15 of 27 positions shown; findings below may reference images not displayed]

FINDINGS: Intrauterine gestational sac: Single, with angular margins, low
lying in the lower uterine segment/cervix

Yolk sac:  No longer visualized.

Embryo:  No longer visualized

MSD: 20.0 mm   6 w   6 d

Subchorionic hemorrhage:  None visualized.

Maternal uterus/adnexae: Bilateral ovaries are within normal limits.

No free fluid.
IMPRESSION: Irregular, low lying gestational sac in the lower uterine
segment/cervix. Yolk sac and gestational sac are no longer
visualized. Findings meet definitive criteria for failed pregnancy.
This follows SRU consensus guidelines: Diagnostic Criteria for
Nonviable Pregnancy Early in the First Trimester. N Engl J Med
